# Patient Record
Sex: Female | Born: 1945 | Hispanic: Yes | Marital: Single | Smoking: Former smoker
Health system: Southern US, Community
[De-identification: ages and names within clinical notes are randomized; demographics above are authoritative.]

## PROBLEM LIST (undated history)

## (undated) DIAGNOSIS — K219 Gastro-esophageal reflux disease without esophagitis: Secondary | ICD-10-CM

## (undated) DIAGNOSIS — K5792 Diverticulitis of intestine, part unspecified, without perforation or abscess without bleeding: Secondary | ICD-10-CM

## (undated) DIAGNOSIS — M1990 Unspecified osteoarthritis, unspecified site: Secondary | ICD-10-CM

## (undated) DIAGNOSIS — J449 Chronic obstructive pulmonary disease, unspecified: Secondary | ICD-10-CM

## (undated) DIAGNOSIS — M797 Fibromyalgia: Secondary | ICD-10-CM

## (undated) DIAGNOSIS — I10 Essential (primary) hypertension: Secondary | ICD-10-CM

## (undated) DIAGNOSIS — F32A Depression, unspecified: Secondary | ICD-10-CM

## (undated) DIAGNOSIS — R51 Headache: Secondary | ICD-10-CM

## (undated) DIAGNOSIS — E785 Hyperlipidemia, unspecified: Secondary | ICD-10-CM

## (undated) DIAGNOSIS — F319 Bipolar disorder, unspecified: Secondary | ICD-10-CM

## (undated) DIAGNOSIS — R519 Headache, unspecified: Secondary | ICD-10-CM

## (undated) DIAGNOSIS — F329 Major depressive disorder, single episode, unspecified: Secondary | ICD-10-CM

## (undated) DIAGNOSIS — K8020 Calculus of gallbladder without cholecystitis without obstruction: Secondary | ICD-10-CM

## (undated) HISTORY — DX: Bipolar disorder, unspecified: F31.9

## (undated) HISTORY — PX: TONSILLECTOMY AND ADENOIDECTOMY: SUR1326

## (undated) HISTORY — DX: Essential (primary) hypertension: I10

## (undated) HISTORY — DX: Depression, unspecified: F32.A

## (undated) HISTORY — PX: TOTAL HIP ARTHROPLASTY: SHX124

## (undated) HISTORY — DX: Diverticulitis of intestine, part unspecified, without perforation or abscess without bleeding: K57.92

## (undated) HISTORY — DX: Fibromyalgia: M79.7

## (undated) HISTORY — DX: Chronic obstructive pulmonary disease, unspecified: J44.9

## (undated) HISTORY — DX: Major depressive disorder, single episode, unspecified: F32.9

## (undated) HISTORY — DX: Unspecified osteoarthritis, unspecified site: M19.90

## (undated) HISTORY — DX: Hyperlipidemia, unspecified: E78.5

## (undated) HISTORY — DX: Calculus of gallbladder without cholecystitis without obstruction: K80.20

## (undated) SURGERY — Surgical Case
Anesthesia: *Unknown

---

## 1999-01-29 HISTORY — PX: COLON RESECTION: SHX5231

## 2005-06-14 ENCOUNTER — Emergency Department (HOSPITAL_COMMUNITY): Admission: EM | Admit: 2005-06-14 | Discharge: 2005-06-14 | Payer: Self-pay | Admitting: Family Medicine

## 2005-07-01 ENCOUNTER — Ambulatory Visit: Payer: Self-pay | Admitting: Gastroenterology

## 2005-07-02 ENCOUNTER — Other Ambulatory Visit: Admission: RE | Admit: 2005-07-02 | Discharge: 2005-07-02 | Payer: Self-pay | Admitting: Family Medicine

## 2005-07-02 ENCOUNTER — Ambulatory Visit: Payer: Self-pay | Admitting: Family Medicine

## 2005-07-02 ENCOUNTER — Encounter: Payer: Self-pay | Admitting: Family Medicine

## 2005-07-04 ENCOUNTER — Ambulatory Visit (HOSPITAL_COMMUNITY): Admission: RE | Admit: 2005-07-04 | Discharge: 2005-07-04 | Payer: Self-pay | Admitting: Gastroenterology

## 2005-07-17 ENCOUNTER — Encounter: Admission: RE | Admit: 2005-07-17 | Discharge: 2005-07-17 | Payer: Self-pay | Admitting: Family Medicine

## 2005-08-13 ENCOUNTER — Ambulatory Visit: Payer: Self-pay | Admitting: Gastroenterology

## 2006-07-18 ENCOUNTER — Encounter: Payer: Self-pay | Admitting: Family Medicine

## 2006-11-26 ENCOUNTER — Other Ambulatory Visit (HOSPITAL_COMMUNITY): Admission: RE | Admit: 2006-11-26 | Discharge: 2007-02-07 | Payer: Self-pay | Admitting: Psychiatry

## 2006-11-27 ENCOUNTER — Ambulatory Visit: Payer: Self-pay | Admitting: Psychiatry

## 2006-12-08 DIAGNOSIS — Z9089 Acquired absence of other organs: Secondary | ICD-10-CM

## 2006-12-08 DIAGNOSIS — J4489 Other specified chronic obstructive pulmonary disease: Secondary | ICD-10-CM | POA: Insufficient documentation

## 2006-12-08 DIAGNOSIS — F319 Bipolar disorder, unspecified: Secondary | ICD-10-CM

## 2006-12-08 DIAGNOSIS — M1990 Unspecified osteoarthritis, unspecified site: Secondary | ICD-10-CM | POA: Insufficient documentation

## 2006-12-08 DIAGNOSIS — Z8719 Personal history of other diseases of the digestive system: Secondary | ICD-10-CM

## 2006-12-08 DIAGNOSIS — E785 Hyperlipidemia, unspecified: Secondary | ICD-10-CM | POA: Insufficient documentation

## 2006-12-08 DIAGNOSIS — J449 Chronic obstructive pulmonary disease, unspecified: Secondary | ICD-10-CM

## 2006-12-09 ENCOUNTER — Ambulatory Visit: Payer: Self-pay | Admitting: Family Medicine

## 2006-12-09 DIAGNOSIS — N951 Menopausal and female climacteric states: Secondary | ICD-10-CM

## 2006-12-13 ENCOUNTER — Ambulatory Visit: Payer: Self-pay | Admitting: Family Medicine

## 2006-12-13 DIAGNOSIS — M545 Low back pain: Secondary | ICD-10-CM

## 2006-12-13 DIAGNOSIS — S139XXA Sprain of joints and ligaments of unspecified parts of neck, initial encounter: Secondary | ICD-10-CM

## 2006-12-15 LAB — CONVERTED CEMR LAB
ALT: 37 units/L — ABNORMAL HIGH (ref 0–35)
Albumin: 4.2 g/dL (ref 3.5–5.2)
Alkaline Phosphatase: 60 units/L (ref 39–117)
BUN: 17 mg/dL (ref 6–23)
Basophils Relative: 0.7 % (ref 0.0–1.0)
Bilirubin, Direct: 0.1 mg/dL (ref 0.0–0.3)
CO2: 27 meq/L (ref 19–32)
Chloride: 103 meq/L (ref 96–112)
Eosinophils Relative: 2.5 % (ref 0.0–5.0)
GFR calc Af Amer: 94 mL/min
Glucose, Bld: 121 mg/dL — ABNORMAL HIGH (ref 70–99)
HCT: 35.9 % — ABNORMAL LOW (ref 36.0–46.0)
HDL: 67.3 mg/dL (ref >39.0)
Neutro Abs: 3.3 10*3/uL (ref 1.4–7.7)
Neutrophils Relative %: 68 % (ref 43.0–77.0)
Platelets: 309 10*3/uL (ref 150–400)
Potassium: 4 meq/L (ref 3.5–5.1)
Total Protein: 7.3 g/dL (ref 6.0–8.3)
VLDL: 23 mg/dL (ref 0–40)
WBC: 4.8 10*3/uL (ref 4.5–10.5)

## 2007-02-02 ENCOUNTER — Encounter (INDEPENDENT_AMBULATORY_CARE_PROVIDER_SITE_OTHER): Payer: Self-pay | Admitting: *Deleted

## 2007-03-11 ENCOUNTER — Telehealth (INDEPENDENT_AMBULATORY_CARE_PROVIDER_SITE_OTHER): Payer: Self-pay | Admitting: *Deleted

## 2007-04-14 ENCOUNTER — Ambulatory Visit: Payer: Self-pay | Admitting: Family Medicine

## 2007-04-26 LAB — CONVERTED CEMR LAB
CO2: 32 meq/L (ref 19–32)
Calcium: 9.7 mg/dL (ref 8.4–10.5)
Chloride: 102 meq/L (ref 96–112)
GFR calc non Af Amer: 91 mL/min

## 2007-04-27 ENCOUNTER — Encounter (INDEPENDENT_AMBULATORY_CARE_PROVIDER_SITE_OTHER): Payer: Self-pay | Admitting: *Deleted

## 2007-12-21 ENCOUNTER — Telehealth: Payer: Self-pay | Admitting: Family Medicine

## 2007-12-22 ENCOUNTER — Telehealth: Payer: Self-pay | Admitting: Family Medicine

## 2008-01-04 ENCOUNTER — Ambulatory Visit: Payer: Self-pay | Admitting: Family Medicine

## 2008-01-04 DIAGNOSIS — I10 Essential (primary) hypertension: Secondary | ICD-10-CM

## 2008-01-04 DIAGNOSIS — B354 Tinea corporis: Secondary | ICD-10-CM | POA: Insufficient documentation

## 2008-01-06 ENCOUNTER — Encounter (INDEPENDENT_AMBULATORY_CARE_PROVIDER_SITE_OTHER): Payer: Self-pay | Admitting: *Deleted

## 2008-01-07 ENCOUNTER — Encounter: Payer: Self-pay | Admitting: Family Medicine

## 2008-01-07 ENCOUNTER — Ambulatory Visit: Payer: Self-pay | Admitting: Family Medicine

## 2008-01-07 ENCOUNTER — Telehealth (INDEPENDENT_AMBULATORY_CARE_PROVIDER_SITE_OTHER): Payer: Self-pay | Admitting: *Deleted

## 2008-01-07 DIAGNOSIS — M79609 Pain in unspecified limb: Secondary | ICD-10-CM

## 2008-01-07 DIAGNOSIS — IMO0001 Reserved for inherently not codable concepts without codable children: Secondary | ICD-10-CM

## 2008-01-07 LAB — CONVERTED CEMR LAB
ALT: 17 units/L (ref 0–35)
AST: 29 units/L (ref 0–37)
Albumin: 4.3 g/dL (ref 3.5–5.2)
CO2: 27 meq/L (ref 19–32)
Chloride: 103 meq/L (ref 96–112)
Creatinine, Ser: 0.8 mg/dL (ref 0.4–1.2)
Direct LDL: 154.9 mg/dL
GFR calc non Af Amer: 78 mL/min
HDL: 66.3 mg/dL (ref >39.0)
Total Bilirubin: 1.1 mg/dL (ref 0.3–1.2)
Total Protein: 7.4 g/dL (ref 6.0–8.3)
Uric Acid, Serum: 5.1 mg/dL (ref 2.4–7.0)
VLDL: 23 mg/dL (ref 0–40)

## 2008-01-08 ENCOUNTER — Encounter (INDEPENDENT_AMBULATORY_CARE_PROVIDER_SITE_OTHER): Payer: Self-pay | Admitting: *Deleted

## 2008-01-08 ENCOUNTER — Telehealth (INDEPENDENT_AMBULATORY_CARE_PROVIDER_SITE_OTHER): Payer: Self-pay | Admitting: *Deleted

## 2008-01-18 ENCOUNTER — Ambulatory Visit: Payer: Self-pay | Admitting: Cardiology

## 2008-01-19 ENCOUNTER — Telehealth (INDEPENDENT_AMBULATORY_CARE_PROVIDER_SITE_OTHER): Payer: Self-pay | Admitting: *Deleted

## 2008-01-21 ENCOUNTER — Encounter (INDEPENDENT_AMBULATORY_CARE_PROVIDER_SITE_OTHER): Payer: Self-pay | Admitting: *Deleted

## 2008-01-25 ENCOUNTER — Ambulatory Visit: Payer: Self-pay | Admitting: Family Medicine

## 2008-01-26 ENCOUNTER — Encounter (INDEPENDENT_AMBULATORY_CARE_PROVIDER_SITE_OTHER): Payer: Self-pay | Admitting: *Deleted

## 2008-01-26 LAB — CONVERTED CEMR LAB
Calcium: 10.1 mg/dL (ref 8.4–10.5)
GFR calc Af Amer: 94 mL/min
GFR calc non Af Amer: 78 mL/min
Glucose, Bld: 115 mg/dL — ABNORMAL HIGH (ref 70–99)

## 2008-02-08 ENCOUNTER — Ambulatory Visit: Payer: Self-pay | Admitting: Family Medicine

## 2008-02-16 ENCOUNTER — Encounter: Payer: Self-pay | Admitting: Family Medicine

## 2008-02-16 ENCOUNTER — Telehealth: Payer: Self-pay | Admitting: Family Medicine

## 2008-02-29 ENCOUNTER — Telehealth (INDEPENDENT_AMBULATORY_CARE_PROVIDER_SITE_OTHER): Payer: Self-pay | Admitting: *Deleted

## 2008-03-24 ENCOUNTER — Ambulatory Visit: Payer: Self-pay | Admitting: Family Medicine

## 2008-03-24 ENCOUNTER — Telehealth (INDEPENDENT_AMBULATORY_CARE_PROVIDER_SITE_OTHER): Payer: Self-pay | Admitting: *Deleted

## 2008-03-24 DIAGNOSIS — K219 Gastro-esophageal reflux disease without esophagitis: Secondary | ICD-10-CM

## 2008-03-24 DIAGNOSIS — R1319 Other dysphagia: Secondary | ICD-10-CM

## 2008-03-25 ENCOUNTER — Ambulatory Visit: Payer: Self-pay | Admitting: Gastroenterology

## 2008-03-25 DIAGNOSIS — R12 Heartburn: Secondary | ICD-10-CM

## 2008-03-29 ENCOUNTER — Ambulatory Visit (HOSPITAL_COMMUNITY): Admission: RE | Admit: 2008-03-29 | Discharge: 2008-03-29 | Payer: Self-pay | Admitting: Gastroenterology

## 2008-03-31 ENCOUNTER — Telehealth (INDEPENDENT_AMBULATORY_CARE_PROVIDER_SITE_OTHER): Payer: Self-pay | Admitting: *Deleted

## 2008-04-14 ENCOUNTER — Ambulatory Visit: Payer: Self-pay | Admitting: Gastroenterology

## 2008-04-14 ENCOUNTER — Ambulatory Visit (HOSPITAL_COMMUNITY): Admission: RE | Admit: 2008-04-14 | Discharge: 2008-04-14 | Payer: Self-pay | Admitting: Gastroenterology

## 2008-04-20 ENCOUNTER — Telehealth: Payer: Self-pay | Admitting: Gastroenterology

## 2008-05-16 ENCOUNTER — Telehealth (INDEPENDENT_AMBULATORY_CARE_PROVIDER_SITE_OTHER): Payer: Self-pay | Admitting: *Deleted

## 2008-06-13 ENCOUNTER — Telehealth (INDEPENDENT_AMBULATORY_CARE_PROVIDER_SITE_OTHER): Payer: Self-pay | Admitting: *Deleted

## 2008-06-16 ENCOUNTER — Telehealth (INDEPENDENT_AMBULATORY_CARE_PROVIDER_SITE_OTHER): Payer: Self-pay | Admitting: *Deleted

## 2008-06-17 ENCOUNTER — Encounter (INDEPENDENT_AMBULATORY_CARE_PROVIDER_SITE_OTHER): Payer: Self-pay | Admitting: *Deleted

## 2008-06-20 ENCOUNTER — Telehealth (INDEPENDENT_AMBULATORY_CARE_PROVIDER_SITE_OTHER): Payer: Self-pay | Admitting: *Deleted

## 2008-08-10 ENCOUNTER — Ambulatory Visit: Payer: Self-pay | Admitting: Gastroenterology

## 2008-09-23 ENCOUNTER — Telehealth (INDEPENDENT_AMBULATORY_CARE_PROVIDER_SITE_OTHER): Payer: Self-pay | Admitting: *Deleted

## 2008-10-06 ENCOUNTER — Telehealth (INDEPENDENT_AMBULATORY_CARE_PROVIDER_SITE_OTHER): Payer: Self-pay | Admitting: *Deleted

## 2008-10-10 ENCOUNTER — Ambulatory Visit: Payer: Self-pay | Admitting: Family Medicine

## 2008-10-10 DIAGNOSIS — R531 Weakness: Secondary | ICD-10-CM | POA: Insufficient documentation

## 2008-10-21 ENCOUNTER — Ambulatory Visit: Payer: Self-pay | Admitting: Licensed Clinical Social Worker

## 2008-10-26 ENCOUNTER — Ambulatory Visit: Payer: Self-pay | Admitting: Licensed Clinical Social Worker

## 2008-11-02 ENCOUNTER — Ambulatory Visit: Payer: Self-pay | Admitting: Licensed Clinical Social Worker

## 2008-11-09 ENCOUNTER — Ambulatory Visit: Payer: Self-pay | Admitting: Licensed Clinical Social Worker

## 2008-11-30 ENCOUNTER — Ambulatory Visit: Payer: Self-pay | Admitting: Licensed Clinical Social Worker

## 2008-12-07 ENCOUNTER — Ambulatory Visit: Payer: Self-pay | Admitting: Licensed Clinical Social Worker

## 2008-12-13 ENCOUNTER — Encounter: Payer: Self-pay | Admitting: Family Medicine

## 2008-12-14 ENCOUNTER — Ambulatory Visit: Payer: Self-pay | Admitting: Licensed Clinical Social Worker

## 2009-01-04 ENCOUNTER — Ambulatory Visit: Payer: Self-pay | Admitting: Licensed Clinical Social Worker

## 2009-01-28 HISTORY — PX: COLOSTOMY CLOSURE: SHX1381

## 2009-02-01 ENCOUNTER — Ambulatory Visit: Payer: Self-pay | Admitting: Licensed Clinical Social Worker

## 2009-03-03 ENCOUNTER — Telehealth: Payer: Self-pay | Admitting: Gastroenterology

## 2009-03-10 ENCOUNTER — Ambulatory Visit: Payer: Self-pay | Admitting: Licensed Clinical Social Worker

## 2009-03-17 ENCOUNTER — Ambulatory Visit: Payer: Self-pay | Admitting: Licensed Clinical Social Worker

## 2009-03-30 ENCOUNTER — Ambulatory Visit: Payer: Self-pay | Admitting: Licensed Clinical Social Worker

## 2009-04-06 ENCOUNTER — Ambulatory Visit: Payer: Self-pay | Admitting: Licensed Clinical Social Worker

## 2009-04-25 ENCOUNTER — Ambulatory Visit: Payer: Self-pay | Admitting: Gastroenterology

## 2009-05-02 ENCOUNTER — Ambulatory Visit: Payer: Self-pay | Admitting: Licensed Clinical Social Worker

## 2009-05-18 ENCOUNTER — Ambulatory Visit: Payer: Self-pay | Admitting: Licensed Clinical Social Worker

## 2009-05-22 ENCOUNTER — Other Ambulatory Visit: Admission: RE | Admit: 2009-05-22 | Discharge: 2009-05-22 | Payer: Self-pay | Admitting: Family Medicine

## 2009-05-22 ENCOUNTER — Ambulatory Visit: Payer: Self-pay | Admitting: Family Medicine

## 2009-05-22 LAB — CONVERTED CEMR LAB
Protein, U semiquant: NEGATIVE
WBC Urine, dipstick: NEGATIVE
pH: 7

## 2009-05-23 LAB — CONVERTED CEMR LAB
Albumin: 4.4 g/dL (ref 3.5–5.2)
BUN: 14 mg/dL (ref 6–23)
Bilirubin, Direct: 0.1 mg/dL (ref 0.0–0.3)
CO2: 28 meq/L (ref 19–32)
Calcium: 9.4 mg/dL (ref 8.4–10.5)
Chloride: 100 meq/L (ref 96–112)
Eosinophils Relative: 4.9 % (ref 0.0–5.0)
GFR calc non Af Amer: 67.21 mL/min (ref >60)
Glucose, Bld: 103 mg/dL — ABNORMAL HIGH (ref 70–99)
HCT: 33.9 % — ABNORMAL LOW (ref 36.0–46.0)
Hemoglobin: 11.8 g/dL — ABNORMAL LOW (ref 12.0–15.0)
LDL Cholesterol: 101 mg/dL — ABNORMAL HIGH (ref 0–99)
Lymphocytes Relative: 24.8 % (ref 12.0–46.0)
MCV: 96.5 fL (ref 78.0–100.0)
Monocytes Relative: 9.3 % (ref 3.0–12.0)
RDW: 13.5 % (ref 11.5–14.6)
Sodium: 139 meq/L (ref 135–145)
TSH: 1.18 microintl units/mL (ref 0.35–5.50)
Total Bilirubin: 0.8 mg/dL (ref 0.3–1.2)
Total Protein: 7 g/dL (ref 6.0–8.3)
Vitamin B-12: 504 pg/mL (ref 211–911)

## 2009-05-24 ENCOUNTER — Encounter (INDEPENDENT_AMBULATORY_CARE_PROVIDER_SITE_OTHER): Payer: Self-pay | Admitting: *Deleted

## 2009-06-07 ENCOUNTER — Encounter: Admission: RE | Admit: 2009-06-07 | Discharge: 2009-06-07 | Payer: Self-pay | Admitting: Family Medicine

## 2009-06-07 ENCOUNTER — Encounter: Payer: Self-pay | Admitting: Family Medicine

## 2009-06-20 ENCOUNTER — Ambulatory Visit: Payer: Self-pay | Admitting: Licensed Clinical Social Worker

## 2009-07-04 ENCOUNTER — Ambulatory Visit: Payer: Self-pay | Admitting: Licensed Clinical Social Worker

## 2009-07-11 ENCOUNTER — Ambulatory Visit: Payer: Self-pay | Admitting: Licensed Clinical Social Worker

## 2009-07-19 ENCOUNTER — Ambulatory Visit: Payer: Self-pay | Admitting: Licensed Clinical Social Worker

## 2009-07-26 ENCOUNTER — Ambulatory Visit: Payer: Self-pay | Admitting: Licensed Clinical Social Worker

## 2009-08-03 ENCOUNTER — Ambulatory Visit: Payer: Self-pay | Admitting: Licensed Clinical Social Worker

## 2009-08-11 ENCOUNTER — Ambulatory Visit: Payer: Self-pay | Admitting: Licensed Clinical Social Worker

## 2009-08-16 ENCOUNTER — Ambulatory Visit: Payer: Self-pay | Admitting: Licensed Clinical Social Worker

## 2009-08-23 ENCOUNTER — Ambulatory Visit: Payer: Self-pay | Admitting: Licensed Clinical Social Worker

## 2009-08-31 ENCOUNTER — Ambulatory Visit: Payer: Self-pay | Admitting: Licensed Clinical Social Worker

## 2009-09-13 ENCOUNTER — Ambulatory Visit: Payer: Self-pay | Admitting: Family Medicine

## 2009-09-13 DIAGNOSIS — H109 Unspecified conjunctivitis: Secondary | ICD-10-CM | POA: Insufficient documentation

## 2009-09-14 ENCOUNTER — Ambulatory Visit: Payer: Self-pay | Admitting: Licensed Clinical Social Worker

## 2009-09-14 LAB — CONVERTED CEMR LAB: TSH: 2.17 microintl units/mL (ref 0.35–5.50)

## 2009-09-18 ENCOUNTER — Telehealth (INDEPENDENT_AMBULATORY_CARE_PROVIDER_SITE_OTHER): Payer: Self-pay | Admitting: *Deleted

## 2009-09-20 ENCOUNTER — Encounter: Payer: Self-pay | Admitting: Family Medicine

## 2009-09-21 ENCOUNTER — Ambulatory Visit: Payer: Self-pay | Admitting: Licensed Clinical Social Worker

## 2009-09-28 HISTORY — PX: FEMUR FRACTURE SURGERY: SHX633

## 2009-09-28 HISTORY — PX: JOINT REPLACEMENT: SHX530

## 2009-09-30 ENCOUNTER — Inpatient Hospital Stay (HOSPITAL_COMMUNITY): Admission: EM | Admit: 2009-09-30 | Discharge: 2009-10-05 | Payer: Self-pay | Admitting: Emergency Medicine

## 2009-09-30 DIAGNOSIS — F1021 Alcohol dependence, in remission: Secondary | ICD-10-CM

## 2009-10-31 ENCOUNTER — Telehealth (INDEPENDENT_AMBULATORY_CARE_PROVIDER_SITE_OTHER): Payer: Self-pay | Admitting: *Deleted

## 2009-12-08 ENCOUNTER — Ambulatory Visit: Payer: Self-pay | Admitting: Licensed Clinical Social Worker

## 2009-12-20 ENCOUNTER — Ambulatory Visit: Payer: Self-pay | Admitting: Licensed Clinical Social Worker

## 2010-01-01 ENCOUNTER — Ambulatory Visit: Payer: Self-pay | Admitting: Licensed Clinical Social Worker

## 2010-01-11 ENCOUNTER — Ambulatory Visit: Payer: Self-pay | Admitting: Licensed Clinical Social Worker

## 2010-01-17 ENCOUNTER — Telehealth: Payer: Self-pay | Admitting: Family Medicine

## 2010-02-08 ENCOUNTER — Encounter: Payer: Self-pay | Admitting: Family Medicine

## 2010-02-08 ENCOUNTER — Other Ambulatory Visit: Payer: Self-pay | Admitting: Family Medicine

## 2010-02-08 ENCOUNTER — Ambulatory Visit
Admission: RE | Admit: 2010-02-08 | Discharge: 2010-02-08 | Payer: Self-pay | Source: Home / Self Care | Attending: Family Medicine | Admitting: Family Medicine

## 2010-02-08 DIAGNOSIS — D649 Anemia, unspecified: Secondary | ICD-10-CM | POA: Insufficient documentation

## 2010-02-08 DIAGNOSIS — S72309A Unspecified fracture of shaft of unspecified femur, initial encounter for closed fracture: Secondary | ICD-10-CM | POA: Insufficient documentation

## 2010-02-08 DIAGNOSIS — E559 Vitamin D deficiency, unspecified: Secondary | ICD-10-CM | POA: Insufficient documentation

## 2010-02-09 ENCOUNTER — Ambulatory Visit: Admit: 2010-02-09 | Payer: Self-pay | Admitting: Family Medicine

## 2010-02-09 LAB — BASIC METABOLIC PANEL
BUN: 19 mg/dL (ref 6–23)
CO2: 26 mEq/L (ref 19–32)
Calcium: 10.2 mg/dL (ref 8.4–10.5)
Chloride: 102 mEq/L (ref 96–112)
Creatinine, Ser: 0.9 mg/dL (ref 0.4–1.2)
GFR: 66.21 mL/min (ref >60.00)
Glucose, Bld: 99 mg/dL (ref 70–99)
Potassium: 4.3 mEq/L (ref 3.5–5.1)
Sodium: 137 mEq/L (ref 135–145)

## 2010-02-09 LAB — HEPATIC FUNCTION PANEL
ALT: 44 U/L — ABNORMAL HIGH (ref 0–35)
AST: 43 U/L — ABNORMAL HIGH (ref 0–37)
Albumin: 4.5 g/dL (ref 3.5–5.2)
Alkaline Phosphatase: 47 U/L (ref 39–117)
Bilirubin, Direct: 0.1 mg/dL (ref 0.0–0.3)
Total Bilirubin: 0.7 mg/dL (ref 0.3–1.2)
Total Protein: 6.9 g/dL (ref 6.0–8.3)

## 2010-02-09 LAB — LIPID PANEL
Cholesterol: 165 mg/dL (ref 0–200)
HDL: 53.5 mg/dL (ref >39.00)
LDL Cholesterol: 84 mg/dL (ref 0–99)
Total CHOL/HDL Ratio: 3
Triglycerides: 138 mg/dL (ref 0.0–149.0)
VLDL: 27.6 mg/dL (ref 0.0–40.0)

## 2010-02-09 LAB — CBC WITH DIFFERENTIAL/PLATELET
Basophils Absolute: 0 10*3/uL (ref 0.0–0.1)
Basophils Relative: 0.8 % (ref 0.0–3.0)
Eosinophils Absolute: 0.2 10*3/uL (ref 0.0–0.7)
Eosinophils Relative: 5.6 % — ABNORMAL HIGH (ref 0.0–5.0)
HCT: 33.6 % — ABNORMAL LOW (ref 36.0–46.0)
Hemoglobin: 11.5 g/dL — ABNORMAL LOW (ref 12.0–15.0)
Lymphocytes Relative: 48.3 % — ABNORMAL HIGH (ref 12.0–46.0)
Lymphs Abs: 1.8 10*3/uL (ref 0.7–4.0)
MCHC: 34.3 g/dL (ref 30.0–36.0)
MCV: 92.1 fl (ref 78.0–100.0)
Monocytes Absolute: 0.3 10*3/uL (ref 0.1–1.0)
Monocytes Relative: 8.6 % (ref 3.0–12.0)
Neutro Abs: 1.3 10*3/uL — ABNORMAL LOW (ref 1.4–7.7)
Neutrophils Relative %: 36.7 % — ABNORMAL LOW (ref 43.0–77.0)
Platelets: 278 10*3/uL (ref 150.0–400.0)
RBC: 3.65 Mil/uL — ABNORMAL LOW (ref 3.87–5.11)
RDW: 13.6 % (ref 11.5–14.6)
WBC: 3.7 10*3/uL — ABNORMAL LOW (ref 4.5–10.5)

## 2010-02-18 ENCOUNTER — Encounter: Payer: Self-pay | Admitting: Family Medicine

## 2010-02-27 NOTE — Assessment & Plan Note (Signed)
Summary: eye red/cbs   Vital Signs:  Patient profile:   65 year old female Height:      66 inches Weight:      189 pounds Temp:     98.5 degrees F oral Pulse rate:   86 / minute BP sitting:   138 / 78  (left arm)  Vitals Entered By: Jeremy Johann CMA (September 13, 2009 11:35 AM) CC: EYE REDNESS   History of Present Illness: Pt here c/o d/c from both eyes and irritation for several days ---pt used otc drops with no relief.   No other symptoms. Pt also c/o difficulty losing weight and would like thyroid checked again.    Current Medications (verified): 1)  Simvastatin 20 Mg Tabs (Simvastatin) .Marland Kitchen.. 1 By Mouth At Bedtime 2)  Cymbalta 60 Mg Cpep (Duloxetine Hcl) .Marland Kitchen.. 1 By Mouth Two Times A Day 3)  Neurontin 600 Mg  Tabs (Gabapentin) .Marland Kitchen.. 1 By Mouth Three Times A Day 4)  Folic Acid .Marland Kitchen.. 1 By Mouth Once Daily 5)  Vit-B6 .... 1 By Mouth Once Daily 6)  Vit B12 .Marland Kitchen.. 1 By Mouth Once Daily 7)  Flax Seed Oil .Marland Kitchen.. 1 By Mouth Once Daily 8)  Mvi With Iron .Marland Kitchen.. 1 By Mouth Once Daily 9)  Coq10 100 Mg  Caps (Coenzyme Q10) .Marland Kitchen.. 1 By Mouth Every Other Day 10)  Magnesium .Marland Kitchen.. 1 By Mouth Once Daily 11)  Calcium 500mg  .... 1 By Mouth Three Times A Day 12)  Vit-C .Marland Kitchen.. 1 By Mouth Once Daily 13)  Ibuprofen 400 Mg  Tabs (Ibuprofen) .Marland Kitchen.. 1-2 By Mouth Three Times A Day As Needed 14)  Alprazolam 1 Mg  Tabs (Alprazolam) .... 1/2 By Mouth Three Times A Day Prn 15)  Seroquel 100 Mg Tabs (Quetiapine Fumarate) .Marland Kitchen.. 1 By Mouth Three Times A Day 16)  Adult Aspirin Ec Low Strength 81 Mg Tbec (Aspirin) .... Take 1 Tablet By Mouth Once A Day 17)  Vitamin D 2000 Unit Tabs (Cholecalciferol) .... Take 1 Tablet By Mouth Once A Day 18)  Acai 500 Mg Caps (Acai) .... 2 A Day. 19)  Potassium Gluconate 550 Mg Tabs (Potassium Gluconate) .... Once Daily. 20)  L-Lysine 500 Mg Tabs (Lysine) .... Once Daily. 21)  Omeprazole 20 Mg Cpdr (Omeprazole) .Marland Kitchen.. 1 By Mouth Two Times A Day 22)  Zostavax 16109 Unt/0.71ml Solr (Zoster  Vaccine Live) .Marland Kitchen.. 1 Ml Im X1 23)  Trilipix 135 Mg Cpdr (Choline Fenofibrate) .Marland Kitchen.. 1 By Mouth Daily. 24)  Slow Fe 160 (50 Fe) Mg Cr-Tabs (Ferrous Sulfate Dried) .Marland Kitchen.. 1 By Mouth Daily. 25)  Sulfacetamide Sodium 10 % Soln (Sulfacetamide Sodium) .... 2 Gtts Both Eyes Q3h For 7-10 Days  Allergies (verified): 1)  ! Levaquin  Past History:  Past medical, surgical, family and social histories (including risk factors) reviewed for relevance to current acute and chronic problems.  Past Medical History: Reviewed history from 01/04/2008 and no changes required. COPD Depression Diverticulitis, hx of Hyperlipidemia Osteoarthritis Hypertension  Past Surgical History: Reviewed history from 12/08/2006 and no changes required. Total hip replacement 1979 AND 1986  Family History: Reviewed history from 12/08/2006 and no changes required. MOTHER:DECEASED FATHER:DECEASED 2 BROTHERS 2 SISTERS HEART: MOTHER(STROKE) Family History of Arthritis: MOTHER'S FAMILY Family History Depression: MOTHER'S FAMILY Family History of Alcoholism/Addiction: BOTH SIDE'S OF THE FAMILY CANCER: MOTHER'S SIDE OF THE FAMILY  Social History: Reviewed history from 05/22/2009 and no changes required. Single Former Smoker Alcohol use-yes Drug use-yes Regular exercise-no Occupation: disabled / retired  Review of Systems      See HPI  Physical Exam  General:  Well-developed,well-nourished,in no acute distress; alert,appropriate and cooperative throughout examination Eyes:  both eyes L > R  + yellow d/c sclera injected - fb  Ears:  External ear exam shows no significant lesions or deformities.  Otoscopic examination reveals clear canals, tympanic membranes are intact bilaterally without bulging, retraction, inflammation or discharge. Hearing is grossly normal bilaterally. Skin:  Intact without suspicious lesions or rashes Psych:  Cognition and judgment appear intact. Alert and cooperative with normal attention  span and concentration. No apparent delusions, illusions, hallucinations   Impression & Recommendations:  Problem # 1:  UNSPECIFIED CONJUNCTIVITIS (ICD-372.30)  Her updated medication list for this problem includes:    Sulfacetamide Sodium 10 % Soln (Sulfacetamide sodium) .Marland Kitchen... 2 gtts both eyes q3h for 7-10 days  Discussed treatment, and urged patient to wash hands carefully after touching face.   Problem # 2:  DEPRESSION (ICD-311)  Her updated medication list for this problem includes:    Cymbalta 60 Mg Cpep (Duloxetine hcl) .Marland Kitchen... 1 by mouth two times a day    Alprazolam 1 Mg Tabs (Alprazolam) .Marland Kitchen... 1/2 by mouth three times a day prn  Orders: Venipuncture (16109) TLB-TSH (Thyroid Stimulating Hormone) (84443-TSH) TLB-T3, Free (Triiodothyronine) (84481-T3FREE) TLB-T4 (Thyrox), Free [phone]) T- * Misc. Laboratory test [phone])  Discussed treatment options, including trial of antidpressant medication. Will refer to behavioral health. Follow-up call in in 24-48 hours and recheck in 2 weeks, sooner as needed. Patient agrees to call if any worsening of symptoms or thoughts of doing harm arise. Verified that the patient has no suicidal ideation at this time.   Complete Medication List: 1)  Simvastatin 20 Mg Tabs (Simvastatin) .Marland Kitchen.. 1 by mouth at bedtime 2)  Cymbalta 60 Mg Cpep (Duloxetine hcl) .Marland Kitchen.. 1 by mouth two times a day 3)  Neurontin 600 Mg Tabs (Gabapentin) .Marland Kitchen.. 1 by mouth three times a day 4)  Folic Acid  .Marland Kitchen.. 1 by mouth once daily 5)  Vit-b6  .... 1 by mouth once daily 6)  Vit B12  .Marland KitchenMarland Kitchen. 1 by mouth once daily 7)  Flax Seed Oil  .Marland Kitchen.. 1 by mouth once daily 8)  Mvi With Iron  .Marland Kitchen.. 1 by mouth once daily 9)  Coq10 100 Mg Caps (Coenzyme q10) .Marland Kitchen.. 1 by mouth every other day 10)  Magnesium  .Marland KitchenMarland Kitchen. 1 by mouth once daily 11)  Calcium 500mg   .... 1 by mouth three times a day 12)  Vit-c  .Marland Kitchen.. 1 by mouth once daily 13)  Ibuprofen 400 Mg Tabs (Ibuprofen) .Marland Kitchen.. 1-2 by mouth three times a day  as needed 14)  Alprazolam 1 Mg Tabs (Alprazolam) .... 1/2 by mouth three times a day prn 15)  Seroquel 100 Mg Tabs (Quetiapine fumarate) .Marland Kitchen.. 1 by mouth three times a day 16)  Adult Aspirin Ec Low Strength 81 Mg Tbec (Aspirin) .... Take 1 tablet by mouth once a day 17)  Vitamin D 2000 Unit Tabs (Cholecalciferol) .... Take 1 tablet by mouth once a day 18)  Acai 500 Mg Caps (Acai) .... 2 a day. 19)  Potassium Gluconate 550 Mg Tabs (Potassium gluconate) .... Once daily. 20)  L-lysine 500 Mg Tabs (Lysine) .... Once daily. 21)  Omeprazole 20 Mg Cpdr (Omeprazole) .Marland Kitchen.. 1 by mouth two times a day 22)  Zostavax 82956 Unt/0.53ml Solr (Zoster vaccine live) .Marland Kitchen.. 1 ml im x1 23)  Trilipix 135 Mg Cpdr (Choline fenofibrate) .Marland Kitchen.. 1 by mouth  daily. 24)  Slow Fe 160 (50 Fe) Mg Cr-tabs (Ferrous sulfate dried) .Marland Kitchen.. 1 by mouth daily. 25)  Sulfacetamide Sodium 10 % Soln (Sulfacetamide sodium) .... 2 gtts both eyes q3h for 7-10 days Prescriptions: ZOSTAVAX 16109 UNT/0.65ML SOLR (ZOSTER VACCINE LIVE) 1 ml IM x1  #1 x 0   Entered and Authorized by:   Loreen Freud DO   Signed by:   Loreen Freud DO on 09/13/2009   Method used:   Print then Give to Patient   RxID:   [id number] SULFACETAMIDE SODIUM 10 % SOLN (SULFACETAMIDE SODIUM) 2 gtts both eyes q3h for 7-10 days  #10 days x 0   Entered and Authorized by:   Loreen Freud DO   Signed by:   Loreen Freud DO on 09/13/2009   Method used:   Electronically to        [facility]. [phone]* (retail)       [address]       [place], [place]  [address]       Ph: [phone]       Fax: [phone]   RxID:   [id number]

## 2010-02-27 NOTE — Progress Notes (Signed)
Summary: psychiatry referral   Phone Note Call from Patient Call back at [phone]   Caller: Patient Summary of Call: Patient called left msg on voicemail has been seen psychologist which has helped well but would now like a referral to see psychiatrist she thinks she should see one now.   ok w/ referral? Initial call taken by: Doristine Devoid CMA,  October 31, 2009 4:51 PM  Follow-up for Phone Call        give her a few numbers off list---Dr Evelene Croon,  Dr Nolen Mu and [facility] to [facility]---we can give her more if that doesn't work Follow-up by: Loreen Freud DO,  October 31, 2009 5:04 PM  Additional Follow-up for Phone Call Additional follow up Details #1::        Left message to call back Almeta Monas CMA [facility] D[facility])  November 01, 2009 2:55 PM     Additional Follow-up for Phone Call Additional follow up Details #2::    Message left on Triage Voicemail: Patient called 2 days ago and would like a call back at [phone]   I spoke with patient and gave her the numbers to all the recommended Dr.'s  Follow-up by: Shonna Chock CMA,  November 02, 2009 12:07 PM

## 2010-02-27 NOTE — Miscellaneous (Signed)
Summary: [facility]  [facility]   Imported By: Lanelle Bal 10/03/2009 10:02:37  _____________________________________________________________________  External Attachment:    Type:   Image     Comment:   External Document

## 2010-02-27 NOTE — Letter (Signed)
Summary: Results Follow up Letter  [facility] [place]  [address] [place], [place] [address]   Phone: [phone]  Fax: [phone]    05/24/2009 MRN: [id number]  Toni [name] [address] [place], [place]  [address]  Dear Ms. PARA,  The following are the results of your recent test(s):  Test         Result    Pap Smear:        Normal __X___  Not Normal _____ Comments: ______________________________________________________ Cholesterol: LDL(Bad cholesterol):         Your goal is less than:         HDL (Good cholesterol):       Your goal is more than: Comments:  ______________________________________________________ Mammogram:        Normal _____  Not Normal _____ Comments:  ___________________________________________________________________ Hemoccult:        Normal _____  Not normal _______ Comments:    _____________________________________________________________________ Other Tests:    We routinely do not discuss normal results over the telephone.  If you desire a copy of the results, or you have any questions about this information we can discuss them at your next office visit.   Sincerely,    Army Fossa CMA  May 24, 2009 7:54 AM

## 2010-02-27 NOTE — Progress Notes (Signed)
Summary: Schedule Office visit   Phone Note Outgoing Call Call back at [facility] Phone [phone]   Call placed by: Harlow Mares CMA Duncan Dull),  March 03, 2009 4:47 PM Call placed to: Patient Summary of Call: Left message on patients machine to call back.  Initial call taken by: Harlow Mares CMA [facility] Dull),  March 03, 2009 4:47 PM  Follow-up for Phone Call        Left message on patients machine to call back.  Follow-up by: Harlow Mares CMA [facility] Dull),  March 09, 2009 3:53 PM

## 2010-02-27 NOTE — Assessment & Plan Note (Signed)
Summary: CPX,pap/drb   Vital Signs:  Patient profile:   65 year old female Height:      66 inches Weight:      184 pounds BMI:     29.81 Pulse rate:   87 / minute Pulse rhythm:   regular BP sitting:   120 / 76  (left arm) Cuff size:   regular  Vitals Entered By: Army Fossa CMA (May 22, 2009 10:06 AM) CC: Pt here for CPX, and pap. Pt states she has been nausated and vomitting for about a month- she has an appt with Dr.Jacobs coming up.  Comments Would like to discuss a heart sonnogram.    History of Present Illness: Pt here for cpe and pap with labs.  Pt c/o NV.  She stopped prilosec and lisinopril because she thought it was causing her "muscles to collapse"--- she had been falling alot.   Pt states symptoms stopped when med stopped .  Pt has appointment with Dr Christella Hartigan within the next 3 weeks.    Pt is still drinking about 3/4 bottle of wine a night.  She has cut down from 1 1/2 bottles a night.  Pt knows she needs to stop drinking but says she can't cope without it.  She says she sees Judithe Modest for counseling and she she is aware.  Her drug of choice would be marijuana if she had access to it but she doesn't.  Pt states Ms Lottie Dawson is aware of all of this.  Pt has tried AA but says it is not for her.  Pt admits to having troble taking care of her self---she is very aware of that.    Preventive Screening-Counseling & Management  Alcohol-Tobacco     Alcohol drinks/day: 3     Alcohol type: WINE     >5/day in last 3 mos: yes     Alcohol Counseling: to decrease amount and/or frequency of alcohol intake     Feels need to cut down: yes     Smoking Status: quit     Year Quit: 2005  Caffeine-Diet-Exercise     Caffeine use/day: 0     Does Patient Exercise: no     Exercise Counseling: to improve exercise regimen  Hep-HIV-STD-Contraception     Dental Visit-last 6 months no     Dental Care Counseling: No coverage     SBE monthly: no     SBE Education/Counseling: to perform regular  SBE      Sexual History:  single.        Drug Use:  yes.    Current Medications (verified): 1)  Simvastatin 20 Mg Tabs (Simvastatin) .Marland Kitchen.. 1 By Mouth At Bedtime 2)  Cymbalta 60 Mg Cpep (Duloxetine Hcl) .Marland Kitchen.. 1 By Mouth Two Times A Day 3)  Neurontin 600 Mg  Tabs (Gabapentin) .Marland Kitchen.. 1 By Mouth Three Times A Day 4)  Folic Acid .Marland Kitchen.. 1 By Mouth Once Daily 5)  Vit-B6 .... 1 By Mouth Once Daily 6)  Vit B12 .Marland Kitchen.. 1 By Mouth Once Daily 7)  Flax Seed Oil .Marland Kitchen.. 1 By Mouth Once Daily 8)  Mvi With Iron .Marland Kitchen.. 1 By Mouth Once Daily 9)  Coq10 100 Mg  Caps (Coenzyme Q10) .Marland Kitchen.. 1 By Mouth Every Other Day 10)  Magnesium .Marland Kitchen.. 1 By Mouth Once Daily 11)  Calcium 500mg  .... 1 By Mouth Three Times A Day 12)  Vit-C .Marland Kitchen.. 1 By Mouth Once Daily 13)  Ibuprofen 400 Mg  Tabs (Ibuprofen) .Marland Kitchen.. 1-2 By Mouth  Three Times A Day As Needed 14)  Alprazolam 1 Mg  Tabs (Alprazolam) .... 1/2 By Mouth Three Times A Day Prn 15)  Seroquel 100 Mg Tabs (Quetiapine Fumarate) .Marland Kitchen.. 1 By Mouth Three Times A Day 16)  Adult Aspirin Ec Low Strength 81 Mg Tbec (Aspirin) .... Take 1 Tablet By Mouth Once A Day 17)  Vitamin D 2000 Unit Tabs (Cholecalciferol) .... Take 1 Tablet By Mouth Once A Day 18)  Acai 500 Mg Caps (Acai) .... 2 A Day. 19)  Potassium Gluconate 550 Mg Tabs (Potassium Gluconate) .... Once Daily. 20)  L-Lysine 500 Mg Tabs (Lysine) .... Once Daily. 21)  Omeprazole 20 Mg Cpdr (Omeprazole) .Marland Kitchen.. 1 By Mouth Two Times A Day 22)  Zostavax 16109 Unt/0.39ml Solr (Zoster Vaccine Live) .Marland Kitchen.. 1 Ml Im X1  Allergies: 1)  ! Levaquin  Past History:  Past Medical History: Last updated: 01/04/2008 COPD Depression Diverticulitis, hx of Hyperlipidemia Osteoarthritis Hypertension  Past Surgical History: Last updated: January 02, 2007 Total hip replacement 1979 AND 1986  Family History: Last updated: 01/02/2007 MOTHER:DECEASED FATHER:DECEASED 2 BROTHERS 2 SISTERS HEART: MOTHER(STROKE) Family History of Arthritis: MOTHER'S FAMILY Family  History Depression: MOTHER'S FAMILY Family History of Alcoholism/Addiction: BOTH SIDE'S OF THE FAMILY CANCER: MOTHER'S SIDE OF THE FAMILY  Social History: Last updated: 05/22/2009 Single Former Smoker Alcohol use-yes Drug use-yes Regular exercise-no Occupation: disabled / retired  Risk Factors: Alcohol Use: 3 (05/22/2009) >5 drinks/d w/in last 3 months: yes (05/22/2009) Caffeine Use: 0 (05/22/2009) Exercise: no (05/22/2009)  Risk Factors: Smoking Status: quit (05/22/2009)  Family History: Reviewed history from 01/02/07 and no changes required. MOTHER:DECEASED FATHER:DECEASED 2 BROTHERS 2 SISTERS HEART: MOTHER(STROKE) Family History of Arthritis: MOTHER'S FAMILY Family History Depression: MOTHER'S FAMILY Family History of Alcoholism/Addiction: BOTH SIDE'S OF THE FAMILY CANCER: MOTHER'S SIDE OF THE FAMILY  Social History: Reviewed history and no changes required. Single Former Smoker Alcohol use-yes Drug use-yes Regular exercise-no Occupation: disabled / retired Drug Use:  yes Sexual History:  single  Caffeine use/day:  0 Dental Care w/in 6 mos.:  no Occupation:  employed  Review of Systems      See HPI General:  Denies chills, fatigue, fever, loss of appetite, malaise, sleep disorder, sweats, weakness, and weight loss. Eyes:  Denies blurring, discharge, double vision, eye irritation, eye pain, halos, itching, light sensitivity, red eye, vision loss-1 eye, and vision loss-both eyes; optho--due. ENT:  Denies decreased hearing, difficulty swallowing, ear discharge, earache, hoarseness, nasal congestion, nosebleeds, postnasal drainage, ringing in ears, sinus pressure, and sore throat. CV:  Denies bluish discoloration of lips or nails, chest pain or discomfort, difficulty breathing at night, difficulty breathing while lying down, fainting, fatigue, leg cramps with exertion, lightheadness, near fainting, palpitations, shortness of breath with exertion, swelling of  feet, swelling of hands, and weight gain. Resp:  Denies chest discomfort, chest pain with inspiration, cough, coughing up blood, excessive snoring, hypersomnolence, morning headaches, pleuritic, shortness of breath, sputum productive, and wheezing. GI:  Complains of indigestion, nausea, and vomiting; denies abdominal pain, bloody stools, change in bowel habits, constipation, dark tarry stools, diarrhea, excessive appetite, gas, hemorrhoids, loss of appetite, vomiting blood, and yellowish skin color. GU:  Denies abnormal vaginal bleeding, decreased libido, discharge, dysuria, genital sores, hematuria, incontinence, nocturia, urinary frequency, and urinary hesitancy. MS:  Denies joint pain, joint redness, joint swelling, loss of strength, low back pain, mid back pain, muscle aches, muscle , cramps, muscle weakness, stiffness, and thoracic pain. Derm:  Denies changes in color of skin, changes in nail beds,  dryness, excessive perspiration, flushing, hair loss, insect bite(s), itching, lesion(s), poor wound healing, and rash. Neuro:  Complains of falling down; denies brief paralysis, difficulty with concentration, disturbances in coordination, headaches, inability to speak, memory loss, numbness, poor balance, seizures, sensation of room spinning, tingling, tremors, visual disturbances, and weakness; falling stopped with d/c lisinopril and omeprazole. Psych:  Complains of depression and easily tearful; denies alternate hallucination ( auditory/visual), anxiety, easily angered, irritability, mental problems, panic attacks, sense of great danger, suicidal thoughts/plans, thoughts of violence, unusual visions or sounds, and thoughts /plans of harming others. Endo:  Denies cold intolerance, excessive hunger, excessive thirst, excessive urination, heat intolerance, polyuria, and weight change. Heme:  Denies abnormal bruising, bleeding, enlarge lymph nodes, fevers, pallor, and skin discoloration. Allergy:  Denies  hives or rash, itching eyes, persistent infections, seasonal allergies, and sneezing.  Physical Exam  General:  Well-developed,well-nourished,in no acute distress; alert,appropriate and cooperative throughout examination Eyes:  pupils equal, pupils round, and pupils reactive to light.   Ears:  External ear exam shows no significant lesions or deformities.  Otoscopic examination reveals clear canals, tympanic membranes are intact bilaterally without bulging, retraction, inflammation or discharge. Hearing is grossly normal bilaterally. Nose:  External nasal examination shows no deformity or inflammation. Nasal mucosa are pink and moist without lesions or exudates. Mouth:  Oral mucosa and oropharynx without lesions or exudates.  Teeth in good repair. Neck:  No deformities, masses, or tenderness noted.no carotid bruits.   Chest Wall:  No deformities, masses, or tenderness noted. Breasts:  No mass, nodules, thickening, tenderness, bulging, retraction, inflamation, nipple discharge or skin changes noted.   Lungs:  Normal respiratory effort, chest expands symmetrically. Lungs are clear to auscultation, no crackles or wheezes. Heart:  normal rate and no murmur.   Abdomen:  Bowel sounds positive,abdomen soft and non-tender without masses, organomegaly or hernias noted. Rectal:  No external abnormalities noted. Normal sphincter tone. No rectal masses or tenderness. Genitalia:  Pelvic Exam:        External: normal female genitalia without lesions or masses        Vagina: normal without lesions or masses        Cervix: normal without lesions or masses        Adnexa: normal bimanual exam without masses or fullness        Uterus: normal by palpation        Pap smear: performed Msk:  normal ROM, no joint tenderness, no joint swelling, no joint warmth, no redness over joints, no joint deformities, no joint instability, and no crepitation.   Pulses:  R posterior tibial normal, R dorsalis pedis normal, R  carotid normal, L posterior tibial normal, L dorsalis pedis normal, and L carotid normal.   Extremities:  No clubbing, cyanosis, edema, or deformity noted with normal full range of motion of all joints.   Neurologic:  No cranial nerve deficits noted. Station and gait are normal. Plantar reflexes are down-going bilaterally. DTRs are symmetrical throughout. Sensory, motor and coordinative functions appear intact. Skin:  Intact without suspicious lesions or rashes Cervical Nodes:  No lymphadenopathy noted Axillary Nodes:  No palpable lymphadenopathy Psych:  Cognition and judgment appear intact. Alert and cooperative with normal attention span and concentration. No apparent delusions, illusions, hallucinations   Impression & Recommendations:  Problem # 1:  ROUTINE GYNECOLOGICAL EXAMINATION (ICD-V72.31) ghm utd pap done Orders: UA Dipstick w/o Micro (manual) (16109)  Problem # 2:  GERD (ICD-530.81)  The following medications were removed from the medication list:  Aciphex 20 Mg Tbec (Rabeprazole sodium) .Marland Kitchen... Take one pill 20-30 minutes prior to breakfast and dinner meals Her updated medication list for this problem includes:    Omeprazole 20 Mg Cpdr (Omeprazole) .Marland Kitchen... 1 by mouth two times a day  Diagnostics Reviewed:  EGD: Location: [facility]   (04/14/2008) Discussed lifestyle modifications, diet, antacids/medications, and preventive measures. Handout provided.   Problem # 3:  HYPERTENSION (ICD-401.9)  Orders: Venipuncture (19147) TLB-Lipid Panel (80061-LIPID) TLB-BMP (Basic Metabolic Panel-BMET) (80048-METABOL) TLB-CBC Platelet - w/Differential (85025-CBCD) TLB-Hepatic/Liver Function Pnl (80076-HEPATIC) TLB-TSH (Thyroid Stimulating Hormone) (84443-TSH) TLB-B12 + Folate Pnl (82956_21308-M57/QIO) T-Vitamin D (25-Hydroxy) (96295-28413) EKG w/ Interpretation (93000)  BP today: 120/76 Prior BP: 110/68 (10/10/2008)  Labs Reviewed: K+: 4.1 (01/25/2008) Creat: : 0.8  (01/25/2008)   Chol: 237 (01/04/2008)   HDL: 66.3 (01/04/2008)   LDL: DEL (01/04/2008)   TG: 116 (01/04/2008)  Problem # 4:  POSTMENOPAUSAL STATUS (ICD-627.2)  Orders: Venipuncture (24401) TLB-Lipid Panel (80061-LIPID) TLB-BMP (Basic Metabolic Panel-BMET) (80048-METABOL) TLB-CBC Platelet - w/Differential (85025-CBCD) TLB-Hepatic/Liver Function Pnl (80076-HEPATIC) TLB-TSH (Thyroid Stimulating Hormone) (84443-TSH) TLB-B12 + Folate Pnl (82746_82607-B12/FOL) T-Vitamin D (25-Hydroxy) (02725-36644) Radiology Referral (Radiology)  Discussed treatment options.   Problem # 5:  HYPERLIPIDEMIA (ICD-272.4)  Her updated medication list for this problem includes:    Simvastatin 20 Mg Tabs (Simvastatin) .Marland Kitchen... 1 by mouth at bedtime  Orders: Venipuncture (03474) TLB-Lipid Panel (80061-LIPID) TLB-BMP (Basic Metabolic Panel-BMET) (80048-METABOL) TLB-CBC Platelet - w/Differential (85025-CBCD) TLB-Hepatic/Liver Function Pnl (80076-HEPATIC) TLB-TSH (Thyroid Stimulating Hormone) (84443-TSH) TLB-B12 + Folate Pnl [phone] T-Vitamin D (25-Hydroxy) (18841-66063) EKG w/ Interpretation (93000)  Labs Reviewed: SGOT: 29 (01/04/2008)   SGPT: 17 (01/04/2008)   HDL:66.3 (01/04/2008), 67.3 (12/09/2006)  LDL:DEL (01/04/2008), 99 (01/60/1093)  Chol:237 (01/04/2008), 189 (12/09/2006)  Trig:116 (01/04/2008), 114 (12/09/2006)  Problem # 6:  COPD (ICD-496)  Problem # 7:  ALCOHOL ABUSE (ICD-305.00) encouraged pt to stop drinking and join AA pt is in counseling   Complete Medication List: 1)  Simvastatin 20 Mg Tabs (Simvastatin) .Marland Kitchen.. 1 by mouth at bedtime 2)  Cymbalta 60 Mg Cpep (Duloxetine hcl) .Marland Kitchen.. 1 by mouth two times a day 3)  Neurontin 600 Mg Tabs (Gabapentin) .Marland Kitchen.. 1 by mouth three times a day 4)  Folic Acid  .Marland Kitchen.. 1 by mouth once daily 5)  Vit-b6  .... 1 by mouth once daily 6)  Vit B12  .Marland KitchenMarland Kitchen. 1 by mouth once daily 7)  Flax Seed Oil  .Marland Kitchen.. 1 by mouth once daily 8)  Mvi With Iron  .Marland Kitchen.. 1 by  mouth once daily 9)  Coq10 100 Mg Caps (Coenzyme q10) .Marland Kitchen.. 1 by mouth every other day 10)  Magnesium  .Marland KitchenMarland Kitchen. 1 by mouth once daily 11)  Calcium 500mg   .... 1 by mouth three times a day 12)  Vit-c  .Marland Kitchen.. 1 by mouth once daily 13)  Ibuprofen 400 Mg Tabs (Ibuprofen) .Marland Kitchen.. 1-2 by mouth three times a day as needed 14)  Alprazolam 1 Mg Tabs (Alprazolam) .... 1/2 by mouth three times a day prn 15)  Seroquel 100 Mg Tabs (Quetiapine fumarate) .Marland Kitchen.. 1 by mouth three times a day 16)  Adult Aspirin Ec Low Strength 81 Mg Tbec (Aspirin) .... Take 1 tablet by mouth once a day 17)  Vitamin D 2000 Unit Tabs (Cholecalciferol) .... Take 1 tablet by mouth once a day 18)  Acai 500 Mg Caps (Acai) .... 2 a day. 19)  Potassium Gluconate 550 Mg Tabs (Potassium gluconate) .... Once daily. 20)  L-lysine 500 Mg Tabs (Lysine) .Marland KitchenMarland KitchenMarland Kitchen  Once daily. 21)  Omeprazole 20 Mg Cpdr (Omeprazole) .Marland Kitchen.. 1 by mouth two times a day 22)  Zostavax 16109 Unt/0.45ml Solr (Zoster vaccine live) .Marland Kitchen.. 1 ml im x1  Patient Instructions: 1)  It is not healthy for men to drink more then 2-3 drinks per day or for women to drink more then 1-2 drinks per day. --- secondary to your stomach issues it would be better for you not to drink alcohol at all 2)  Schedule your mammogram. --- we actually are scheduling it for you Prescriptions: ZOSTAVAX 60454 UNT/0.65ML SOLR (ZOSTER VACCINE LIVE) 1 ml IM x1  #1 x 0   Entered and Authorized by:   Loreen Freud DO   Signed by:   Loreen Freud DO on 05/22/2009   Method used:   Print then Give to Patient   RxID:   [id number] OMEPRAZOLE 20 MG CPDR (OMEPRAZOLE) 1 by mouth two times a day  #60 x 0   Entered and Authorized by:   Loreen Freud DO   Signed by:   Loreen Freud DO on 05/22/2009   Method used:   Electronically to        [facility]. [phone]* (retail)       [address]       [place], [place]  [address]       Ph: [phone]       Fax: [phone]   RxID:    [id number]    EKG  Procedure date:  05/22/2009  Findings:      Normal sinus rhythm with rate of:  85 bpm    Flu Vaccine Result Date:  11/09/2008 Flu Vaccine Result:  given Flu Vaccine Next Due:  1 yr  Laboratory Results   Urine Tests   Date/Time Reported: May 22, 2009 1:20 PM   Routine Urinalysis   Color: yellow Appearance: Clear Glucose: negative   (Normal Range: Negative) Bilirubin: negative   (Normal Range: Negative) Ketone: negative   (Normal Range: Negative) Spec. Gravity: 1.015   (Normal Range: 1.003-1.035) Blood: negative   (Normal Range: Negative) pH: 7.0   (Normal Range: 5.0-8.0) Protein: negative   (Normal Range: Negative) Urobilinogen: negative   (Normal Range: 0-1) Nitrite: negative   (Normal Range: Negative) Leukocyte Esterace: negative   (Normal Range: Negative)    Comments: Floydene Flock  May 22, 2009 1:21 PM

## 2010-02-27 NOTE — Consult Note (Signed)
Summary: [facility]   Imported By: Lanelle Bal 10/05/2009 12:23:56  _____________________________________________________________________  External Attachment:    Type:   Image     Comment:   External Document

## 2010-02-27 NOTE — Progress Notes (Signed)
Summary: EYE NO BETTER  Phone Note Call from Patient   Caller: Patient Summary of Call: PT LEFT VM THAT HER EYE DID GET BETTER BUT NOW IT HAS STARTED TO DECLINE AGAIN. PT IS REQUESTING TO BE REFERRED TO OPHTHALMOLOGIST .PLS ADVISE..............Marland KitchenFelecia Deloach CMA  September 18, 2009 11:46 AM   Follow-up for Phone Call        referral but in Follow-up by: Loreen Freud DO,  September 18, 2009 11:50 AM  Additional Follow-up for Phone Call Additional follow up Details #1::        left detailed msg on patient voicemail referral put in.........Marland KitchenDoristine Devoid CMA  September 18, 2009 4:30 PM

## 2010-03-01 NOTE — Miscellaneous (Signed)
Summary: Face to Face Encounter/[facility]  Face to Face Encounter/[facility]   Imported By: Lanelle Bal 02/15/2010 14:17:53  _____________________________________________________________________  External Attachment:    Type:   Image     Comment:   External Document

## 2010-03-01 NOTE — Assessment & Plan Note (Signed)
Summary: d/c from skilled nuring//lch   Vital Signs:  Patient profile:   65 year old female Weight:      183.8 pounds Pulse rate:   84 / minute Pulse rhythm:   regular BP sitting:   122 / 80  (left arm) Cuff size:   regular  Vitals Entered By: Almeta Monas CMA Duncan Dull) (February 08, 2010 2:36 PM) CC: wants to discuss being d/c from nursing facility   History of Present Illness: Pt here s/p R femur fracture 09/30/2009.  Pt was released from nursing facility Dec 30, 2009.   Pt needs homecare forms filled out.  Pt has been sober since Sept 3.     Preventive Screening-Counseling & Management  Alcohol-Tobacco     Alcohol drinks/day: 0  quit 09/30/2009  Current Medications (verified): 1)  Simvastatin 20 Mg Tabs (Simvastatin) .Marland Kitchen.. 1 By Mouth At Bedtime 2)  Cymbalta 60 Mg Cpep (Duloxetine Hcl) .Marland Kitchen.. 1 By Mouth Two Times A Day 3)  Neurontin 600 Mg  Tabs (Gabapentin) .Marland Kitchen.. 1 By Mouth Three Times A Day 4)  Folic Acid .Marland Kitchen.. 1 By Mouth Once Daily 5)  Vit-B6 .... 1 By Mouth Once Daily 6)  Vit B12 .Marland Kitchen.. 1 By Mouth Once Daily 7)  Flax Seed Oil .Marland Kitchen.. 1 By Mouth Once Daily 8)  Mvi With Iron .Marland Kitchen.. 1 By Mouth Once Daily 9)  Coq10 100 Mg  Caps (Coenzyme Q10) .Marland Kitchen.. 1 By Mouth Every Other Day 10)  Magnesium .Marland Kitchen.. 1 By Mouth Once Daily 11)  Calcium 500mg  .... 1 By Mouth Three Times A Day 12)  Vit-C .Marland Kitchen.. 1 By Mouth Once Daily 13)  Ibuprofen 400 Mg  Tabs (Ibuprofen) .Marland Kitchen.. 1-2 By Mouth Three Times A Day As Needed 14)  Alprazolam 1 Mg  Tabs (Alprazolam) .... 1/2 By Mouth Three Times A Day Prn 15)  Seroquel 100 Mg Tabs (Quetiapine Fumarate) .Marland Kitchen.. 1 By Mouth Three Times A Day 16)  Adult Aspirin Ec Low Strength 81 Mg Tbec (Aspirin) .... Take 1 Tablet By Mouth Once A Day 17)  Vitamin D 2000 Unit Tabs (Cholecalciferol) .... Take 1 Tablet By Mouth Once A Day 18)  Acai 500 Mg Caps (Acai) .... 2 A Day. 19)  Potassium Gluconate 550 Mg Tabs (Potassium Gluconate) .... Once Daily. 20)  L-Lysine 500 Mg Tabs (Lysine) .... Once  Daily. 21)  Omeprazole 20 Mg Cpdr (Omeprazole) .Marland Kitchen.. 1 By Mouth Two Times A Day 22)  Zostavax 16109 Unt/0.47ml Solr (Zoster Vaccine Live) .Marland Kitchen.. 1 Ml Im X1 23)  Trilipix 135 Mg Cpdr (Choline Fenofibrate) .Marland Kitchen.. 1 By Mouth Daily.**labs Due Now* 24)  Slow Fe 160 (50 Fe) Mg Cr-Tabs (Ferrous Sulfate Dried) .Marland Kitchen.. 1 By Mouth Daily. 25)  Sulfacetamide Sodium 10 % Soln (Sulfacetamide Sodium) .... 2 Gtts Both Eyes Q3h For 7-10 Days 26)  Robaxin 500 Mg Tabs (Methocarbamol) .Marland Kitchen.. 1 By Mouth Every 6 Hours As Needed Muscle Spasms 27)  Vicodin Unknown Dose 28)  Vitamin D 1000 Unit Tabs (Cholecalciferol) .... 4 A Day  Allergies (verified): 1)  ! Levaquin  Past History:  Past Medical History: Last updated: 01/04/2008 COPD Depression Diverticulitis, hx of Hyperlipidemia Osteoarthritis Hypertension  Family History: Last updated: 2006-12-23 MOTHER:DECEASED FATHER:DECEASED 2 BROTHERS 2 SISTERS HEART: MOTHER(STROKE) Family History of Arthritis: MOTHER'S FAMILY Family History Depression: MOTHER'S FAMILY Family History of Alcoholism/Addiction: BOTH SIDE'S OF THE FAMILY CANCER: MOTHER'S SIDE OF THE FAMILY  Social History: Last updated: 05/22/2009 Single Former Smoker Alcohol use-yes Drug use-yes Regular exercise-no Occupation: disabled /  retired  Risk Factors: Alcohol Use: 0  quit 09/30/2009 (02/08/2010) >5 drinks/d w/in last 3 months: yes (05/22/2009) Caffeine Use: 0 (05/22/2009) Exercise: no (05/22/2009)  Risk Factors: Smoking Status: quit (05/22/2009)  Past Surgical History: Total hip replacement 1979 AND 1986 R fractured femur 09/2009  Family History: Reviewed history from 12/08/2006 and no changes required. MOTHER:DECEASED FATHER:DECEASED 2 BROTHERS 2 SISTERS HEART: MOTHER(STROKE) Family History of Arthritis: MOTHER'S FAMILY Family History Depression: MOTHER'S FAMILY Family History of Alcoholism/Addiction: BOTH SIDE'S OF THE FAMILY CANCER: MOTHER'S SIDE OF THE FAMILY  Social  History: Reviewed history from 05/22/2009 and no changes required. Single Former Smoker Alcohol use-yes Drug use-yes Regular exercise-no Occupation: disabled / retired  Review of Systems      See HPI  Physical Exam  General:  Well-developed,well-nourished,in no acute distress; alert,appropriate and cooperative throughout examination Neck:  No deformities, masses, or tenderness noted. Lungs:  Normal respiratory effort, chest expands symmetrically. Lungs are clear to auscultation, no crackles or wheezes. Heart:  normal rate and no murmur.   Psych:  Oriented X3, normally interactive, good eye contact, not anxious appearing, not depressed appearing, not agitated, and not suicidal.     Impression & Recommendations:  Problem # 1:  CLOSED FRACTURE OF SHAFT OF FEMUR (ICD-821.01) Assessment Improved per ortho home health form filled out hospital records reviewed  Problem # 2:  ALCOHOL ABUSE, IN REMISSION, HX OF (ICD-V11.3) Assessment: Improved Great!  keep up the good work!  Problem # 3:  UNSPECIFIED VITAMIN D DEFICIENCY (ICD-268.9)  Orders: T-Vitamin D (25-Hydroxy) (30865-78469) Specimen Handling (62952)  Problem # 4:  UNSPECIFIED ANEMIA (ICD-285.9)  Her updated medication list for this problem includes:    Slow Fe 160 (50 Fe) Mg Cr-tabs (Ferrous sulfate dried) .Marland Kitchen... 1 by mouth daily.  Orders: Venipuncture (84132) TLB-Lipid Panel (80061-LIPID) TLB-BMP (Basic Metabolic Panel-BMET) (80048-METABOL) TLB-CBC Platelet - w/Differential (85025-CBCD) TLB-Hepatic/Liver Function Pnl (80076-HEPATIC)  Hgb: 11.8 (05/22/2009)   Hct: 33.9 (05/22/2009)   Platelets: 283.0 (05/22/2009) RBC: 3.52 (05/22/2009)   RDW: 13.5 (05/22/2009)   WBC: 4.4 (05/22/2009) MCV: 96.5 (05/22/2009)   MCHC: 34.8 (05/22/2009) B12: 504 (05/22/2009)   Folate: 7.2 (05/22/2009)   TSH: 2.17 (09/13/2009)  Problem # 5:  HYPERTENSION (ICD-401.9)  Orders: Venipuncture (44010) TLB-Lipid Panel (80061-LIPID) TLB-BMP  (Basic Metabolic Panel-BMET) (80048-METABOL) TLB-CBC Platelet - w/Differential (85025-CBCD) TLB-Hepatic/Liver Function Pnl (80076-HEPATIC) Specimen Handling (27253)  BP today: 122/80 Prior BP: 138/78 (09/13/2009)  Labs Reviewed: K+: 3.9 (05/22/2009) Creat: : 0.9 (05/22/2009)   Chol: 192 (05/22/2009)   HDL: 54.40 (05/22/2009)   LDL: 101 (05/22/2009)   TG: 182.0 (05/22/2009)  Problem # 6:  HYPERLIPIDEMIA (ICD-272.4)  Her updated medication list for this problem includes:    Simvastatin 20 Mg Tabs (Simvastatin) .Marland Kitchen... 1 by mouth at bedtime    Trilipix 135 Mg Cpdr (Choline fenofibrate) .Marland Kitchen... 1 by mouth daily.**labs due now*  Orders: Venipuncture (66440) TLB-Lipid Panel (80061-LIPID) TLB-BMP (Basic Metabolic Panel-BMET) (80048-METABOL) TLB-CBC Platelet - w/Differential (85025-CBCD) TLB-Hepatic/Liver Function Pnl (80076-HEPATIC) Specimen Handling (34742)  Labs Reviewed: SGOT: 35 (05/22/2009)   SGPT: 46 (05/22/2009)   HDL:54.40 (05/22/2009), 66.3 (01/04/2008)  LDL:101 (05/22/2009), DEL (01/04/2008)  Chol:192 (05/22/2009), 237 (01/04/2008)  Trig:182.0 (05/22/2009), 116 (01/04/2008)  Complete Medication List: 1)  Simvastatin 20 Mg Tabs (Simvastatin) .Marland Kitchen.. 1 by mouth at bedtime 2)  Cymbalta 60 Mg Cpep (Duloxetine hcl) .Marland Kitchen.. 1 by mouth two times a day 3)  Neurontin 600 Mg Tabs (Gabapentin) .Marland Kitchen.. 1 by mouth three times a day 4)  Folic Acid  .Marland Kitchen.. 1 by  mouth once daily 5)  Vit-b6  .... 1 by mouth once daily 6)  Vit B12  .Marland KitchenMarland Kitchen. 1 by mouth once daily 7)  Flax Seed Oil  .Marland Kitchen.. 1 by mouth once daily 8)  Mvi With Iron  .Marland Kitchen.. 1 by mouth once daily 9)  Coq10 100 Mg Caps (Coenzyme q10) .Marland Kitchen.. 1 by mouth every other day 10)  Magnesium  .Marland KitchenMarland Kitchen. 1 by mouth once daily 11)  Calcium 500mg   .... 1 by mouth three times a day 12)  Vit-c  .Marland Kitchen.. 1 by mouth once daily 13)  Ibuprofen 400 Mg Tabs (Ibuprofen) .Marland Kitchen.. 1-2 by mouth three times a day as needed 14)  Alprazolam 1 Mg Tabs (Alprazolam) .... 1/2 by mouth three times a  day prn 15)  Seroquel 100 Mg Tabs (Quetiapine fumarate) .Marland Kitchen.. 1 by mouth three times a day 16)  Adult Aspirin Ec Low Strength 81 Mg Tbec (Aspirin) .... Take 1 tablet by mouth once a day 17)  Vitamin D 2000 Unit Tabs (Cholecalciferol) .... Take 1 tablet by mouth once a day 18)  Acai 500 Mg Caps (Acai) .... 2 a day. 19)  Potassium Gluconate 550 Mg Tabs (Potassium gluconate) .... Once daily. 20)  L-lysine 500 Mg Tabs (Lysine) .... Once daily. 21)  Omeprazole 20 Mg Cpdr (Omeprazole) .Marland Kitchen.. 1 by mouth two times a day 22)  Zostavax 16109 Unt/0.28ml Solr (Zoster vaccine live) .Marland Kitchen.. 1 ml im x1 23)  Trilipix 135 Mg Cpdr (Choline fenofibrate) .Marland Kitchen.. 1 by mouth daily.**labs due now* 24)  Slow Fe 160 (50 Fe) Mg Cr-tabs (Ferrous sulfate dried) .Marland Kitchen.. 1 by mouth daily. 25)  Sulfacetamide Sodium 10 % Soln (Sulfacetamide sodium) .... 2 gtts both eyes q3h for 7-10 days 26)  Robaxin 500 Mg Tabs (Methocarbamol) .Marland Kitchen.. 1 by mouth every 6 hours as needed muscle spasms 27)  Vicodin Unknown Dose  28)  Vitamin D 1000 Unit Tabs (Cholecalciferol) .... 4 a day   Orders Added: 1)  Venipuncture [36415] 2)  TLB-Lipid Panel [80061-LIPID] 3)  TLB-BMP (Basic Metabolic Panel-BMET) [80048-METABOL] 4)  TLB-CBC Platelet - w/Differential [85025-CBCD] 5)  TLB-Hepatic/Liver Function Pnl [80076-HEPATIC] 6)  T-Vitamin D (25-Hydroxy) [60454-09811] 7)  Specimen Handling [99000] 8)  Est. Patient Level IV [91478]

## 2010-03-01 NOTE — Progress Notes (Signed)
Summary: Refill Request  Phone Note Refill Request Message from:  Patient on January 17, 2010 10:08 AM  Refills Requested: Medication #1:  NEURONTIN 600 MG  TABS 1 by mouth three times a day   Dosage confirmed as above?Dosage Confirmed   Supply Requested: 1 month   Last Refilled: 07/04/2009   Notes: patient got a rx for 60 day supply upon d/c from nursing home, but needed 90 day. So needs 30 day to keep her on track. [facility] [address]  Next Appointment Scheduled: 1.5.12 Initial call taken by: Harold Barban,  January 17, 2010 10:08 AM  Follow-up for Phone Call        seen 09/13/09 please advise if it is ok to call in a 30 day supply for patient to keep her on track. Follow-up by: Almeta Monas CMA Duncan Dull),  January 17, 2010 12:04 PM  Additional Follow-up for Phone Call Additional follow up Details #1::        that is fine Additional Follow-up by: Loreen Freud DO,  January 17, 2010 12:16 PM    Prescriptions: NEURONTIN 600 MG  TABS (GABAPENTIN) 1 by mouth three times a day  #30 x 0   Entered by:   Almeta Monas CMA (AAMA)   Authorized by:   Loreen Freud DO   Signed by:   Almeta Monas CMA (AAMA) on 01/17/2010   Method used:   Faxed to ...       [facility] [address] [phone]* (retail)       [address]       [place], [place]  [address]       Ph: [phone]       Fax: [phone]   RxID:   [id number]

## 2010-03-06 ENCOUNTER — Ambulatory Visit: Payer: Self-pay | Admitting: Licensed Clinical Social Worker

## 2010-03-26 ENCOUNTER — Telehealth: Payer: Self-pay | Admitting: Family Medicine

## 2010-04-05 NOTE — Progress Notes (Signed)
Summary: Seroquel Refill  Phone Note Refill Request Message from:  Pharmacy on March 26, 2010 11:06 AM  Refills Requested: Medication #1:  SEROQUEL 100 MG TABS 1 by mouth three times a day   Dosage confirmed as above?Dosage Confirmed   Last Refilled: 07/28/2009 [facility] [phone] fax # [phone]   Method Requested: Fax to Local Pharmacy Initial call taken by: Almeta Monas CMA Duncan Dull),  March 26, 2010 11:07 AM  Follow-up for Phone Call        please advise Follow-up by: Almeta Monas CMA Duncan Dull),  March 26, 2010 11:08 AM  Additional Follow-up for Phone Call Additional follow up Details #1::        refill x1  3 refills Additional Follow-up by: Loreen Freud DO,  March 26, 2010 11:31 AM    Prescriptions: SEROQUEL 100 MG TABS (QUETIAPINE FUMARATE) 1 by mouth three times a day  #90 Each x 3   Entered by:   Almeta Monas CMA (AAMA)   Authorized by:   Loreen Freud DO   Signed by:   Almeta Monas CMA (AAMA) on 03/26/2010   Method used:   Faxed to ...       [facility] [address] [phone]* (retail)       [address]       [place], [place]  [address]       Ph: [phone]       Fax: [phone]   RxID:   [id number]

## 2010-04-12 LAB — COMPREHENSIVE METABOLIC PANEL
ALT: 34 U/L (ref 0–35)
Alkaline Phosphatase: 72 U/L (ref 39–117)
BUN: 12 mg/dL (ref 6–23)
CO2: 29 mEq/L (ref 19–32)
Calcium: 8.7 mg/dL (ref 8.4–10.5)
Chloride: 103 mEq/L (ref 96–112)
Creatinine, Ser: 1.01 mg/dL (ref 0.4–1.2)
GFR calc non Af Amer: 59 mL/min — ABNORMAL LOW (ref >60)
Glucose, Bld: 102 mg/dL — ABNORMAL HIGH (ref 70–99)
Glucose, Bld: 141 mg/dL — ABNORMAL HIGH (ref 70–99)
Potassium: 3.7 mEq/L (ref 3.5–5.1)
Sodium: 138 mEq/L (ref 135–145)
Total Bilirubin: 0.7 mg/dL (ref 0.3–1.2)
Total Protein: 5.5 g/dL — ABNORMAL LOW (ref 6.0–8.3)

## 2010-04-12 LAB — BASIC METABOLIC PANEL
BUN: 15 mg/dL (ref 6–23)
BUN: 21 mg/dL (ref 6–23)
CO2: 26 mEq/L (ref 19–32)
CO2: 27 mEq/L (ref 19–32)
Calcium: 8.3 mg/dL — ABNORMAL LOW (ref 8.4–10.5)
Calcium: 9.4 mg/dL (ref 8.4–10.5)
Creatinine, Ser: 0.95 mg/dL (ref 0.4–1.2)
Creatinine, Ser: 0.96 mg/dL (ref 0.4–1.2)
Creatinine, Ser: 1.13 mg/dL (ref 0.4–1.2)
GFR calc Af Amer: >60 mL/min (ref >60)
GFR calc non Af Amer: 59 mL/min — ABNORMAL LOW (ref >60)
GFR calc non Af Amer: 59 mL/min — ABNORMAL LOW (ref >60)
Glucose, Bld: 121 mg/dL — ABNORMAL HIGH (ref 70–99)
Glucose, Bld: 141 mg/dL — ABNORMAL HIGH (ref 70–99)
Potassium: 4.5 mEq/L (ref 3.5–5.1)

## 2010-04-12 LAB — URINALYSIS, ROUTINE W REFLEX MICROSCOPIC
Glucose, UA: NEGATIVE mg/dL
Hgb urine dipstick: NEGATIVE
pH: 5.5 (ref 5.0–8.0)

## 2010-04-12 LAB — ABO/RH: ABO/RH(D): A POS

## 2010-04-12 LAB — CBC
HCT: 26.8 % — ABNORMAL LOW (ref 36.0–46.0)
HCT: 28.4 % — ABNORMAL LOW (ref 36.0–46.0)
Hemoglobin: 9.1 g/dL — ABNORMAL LOW (ref 12.0–15.0)
MCH: 32.8 pg (ref 26.0–34.0)
MCH: 32.9 pg (ref 26.0–34.0)
MCH: 33 pg (ref 26.0–34.0)
MCHC: 33.9 g/dL (ref 30.0–36.0)
MCHC: 34.1 g/dL (ref 30.0–36.0)
MCHC: 34.8 g/dL (ref 30.0–36.0)
MCV: 92.9 fL (ref 78.0–100.0)
MCV: 95.8 fL (ref 78.0–100.0)
Platelets: 138 10*3/uL — ABNORMAL LOW (ref 150–400)
Platelets: 211 10*3/uL (ref 150–400)
RBC: 2.49 MIL/uL — ABNORMAL LOW (ref 3.87–5.11)
RBC: 2.88 MIL/uL — ABNORMAL LOW (ref 3.87–5.11)
RBC: 3.35 MIL/uL — ABNORMAL LOW (ref 3.87–5.11)
RDW: 13.7 % (ref 11.5–15.5)
RDW: 15.9 % — ABNORMAL HIGH (ref 11.5–15.5)
RDW: 16.2 % — ABNORMAL HIGH (ref 11.5–15.5)
WBC: 6.1 10*3/uL (ref 4.0–10.5)

## 2010-04-12 LAB — URINE CULTURE: Culture: NO GROWTH

## 2010-04-12 LAB — DIFFERENTIAL
Basophils Absolute: 0 10*3/uL (ref 0.0–0.1)
Basophils Relative: 0 % (ref 0–1)
Eosinophils Absolute: 0.2 10*3/uL (ref 0.0–0.7)
Eosinophils Relative: 3 % (ref 0–5)
Lymphs Abs: 0.7 10*3/uL (ref 0.7–4.0)
Neutrophils Relative %: 80 % — ABNORMAL HIGH (ref 43–77)

## 2010-04-12 LAB — TYPE AND SCREEN: ABO/RH(D): A POS

## 2010-04-12 LAB — PREPARE RBC (CROSSMATCH)

## 2010-04-12 LAB — ETHANOL: Alcohol, Ethyl (B): 5 mg/dL (ref 0–10)

## 2010-04-12 LAB — PROTIME-INR
INR: 1.05 (ref 0.00–1.49)
Prothrombin Time: 13.9 seconds (ref 11.6–15.2)

## 2010-04-12 LAB — HEPATIC FUNCTION PANEL
AST: 48 U/L — ABNORMAL HIGH (ref 0–37)
Bilirubin, Direct: <0.1 mg/dL (ref 0.0–0.3)
Total Protein: 6.7 g/dL (ref 6.0–8.3)

## 2010-04-12 LAB — HEMOGLOBIN AND HEMATOCRIT, BLOOD: Hemoglobin: 8.8 g/dL — ABNORMAL LOW (ref 12.0–15.0)

## 2010-05-10 LAB — HEMOGLOBIN AND HEMATOCRIT, BLOOD
HCT: 33.8 % — ABNORMAL LOW (ref 36.0–46.0)
Hemoglobin: 11.4 g/dL — ABNORMAL LOW (ref 12.0–15.0)

## 2010-06-15 NOTE — Assessment & Plan Note (Signed)
[facility]                           GASTROENTEROLOGY OFFICE NOTE   NAME:TOUCHETArlayne, Toni [name]                        MRN:          [id number]  DATE:08/13/2005                            DOB:          [birth year]    PRIMARY CARE PHYSICIAN:  No primary care physician.   GI PROBLEM LIST:  1.  Complicated diverticulitis, treated with sigmoid colectomy in 2001,      sigmoid colostomy takedown November 2001. Post-surgery colonoscopy in      2002 was normal per patient.  2.  Functional abdominal discomfort, dyspepsia, bloating.  No weight loss,      no anemia.  No signs of GI bleeding.   INTERVAL HISTORY:  I last saw Toni Toni [name] six weeks ago.  Since then, her  abdominal pain has improved with MiraLax and relief of her relative  constipation.  She had small bowel follow-through study that was essentially  normal.  CBC was also normal.  She is still bothered by daily bloating.  She  admitted today to me that she has been very depressed lately about her  mother's death in 10/28/2004.  She has been drinking 5-6 full bottles of  wine by herself on a weekly basis.  She is seen in counseling.  She has had  no suicidal ideations.   CURRENT MEDICATIONS:  Ibuprofen, simvastatin, Effexor, gabapentin,  fluticasone, Atrovent, multivitamin, folic acid, vitamin D, flaxseed.  magnesium, coenzyme-Q, vitamin D, B-12, B-6, Prozac daily.   PHYSICAL EXAMINATION:  VITAL SIGNS:  Weight 140 pounds, up 5 pounds since  her last visit, blood pressure 126/82, pulse 88.  CONSTITUTIONAL:  Generally well appearing.  Labile affect, with tears at  times during this examination.  LUNGS:  Clear to auscultation bilaterally.  HEART:  Regular rate and rhythm.  ABDOMEN:  Soft, nontender, nondistended.  Normal bowel sounds.   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   She has no warning symptoms such as  weight loss, bleeding, or anemia.  She  is obviously quite depressed lately, drinking to excess, and I suspect these  are at least contributing to her abdominal discomforts.  She is seeing a  therapist on a weekly basis and has an appointment just following this  appointment.  I recommend that she continue working on those issues.  I see  no reason to do any further GI workup at this point, but she will return to  see me in 2 months' time, and sooner if needed.                                   Toni Fee, MD   DPJ/MedQ  DD:  08/13/2005  DT:  08/13/2005  Job #:  [id number]

## 2010-07-20 ENCOUNTER — Other Ambulatory Visit: Payer: Self-pay | Admitting: Family Medicine

## 2010-08-25 ENCOUNTER — Other Ambulatory Visit: Payer: Self-pay | Admitting: Family Medicine

## 2010-09-23 ENCOUNTER — Other Ambulatory Visit: Payer: Self-pay | Admitting: Family Medicine

## 2010-09-24 NOTE — Telephone Encounter (Signed)
Cymbalta Last filled 08-25-10 #60, last ov 02-08-10

## 2010-09-24 NOTE — Telephone Encounter (Signed)
Rx sent 

## 2010-10-23 ENCOUNTER — Other Ambulatory Visit: Payer: Self-pay | Admitting: Family Medicine

## 2010-11-02 LAB — URINE DRUGS OF ABUSE SCREEN W ALC, ROUTINE (REF LAB)
Benzodiazepines.: NEGATIVE
Ethyl Alcohol: <5
Opiate Screen, Urine: NEGATIVE
Phencyclidine (PCP): NEGATIVE
Propoxyphene: NEGATIVE

## 2010-11-05 LAB — URINE DRUGS OF ABUSE SCREEN W ALC, ROUTINE (REF LAB)
Amphetamine Screen, Ur: NEGATIVE
Amphetamine Screen, Ur: NEGATIVE
Benzodiazepines.: NEGATIVE
Ethyl Alcohol: <5
Ethyl Alcohol: <5
Marijuana Metabolite: NEGATIVE
Marijuana Metabolite: POSITIVE — AB
Opiate Screen, Urine: NEGATIVE
Opiate Screen, Urine: NEGATIVE

## 2010-11-05 LAB — THC (MARIJUANA), URINE, CONFIRMATION: Marijuana, Ur-Confirmation: 33 ng/mL

## 2010-11-06 LAB — URINE DRUGS OF ABUSE SCREEN W ALC, ROUTINE (REF LAB)
Amphetamine Screen, Ur: NEGATIVE
Barbiturate Quant, Ur: NEGATIVE
Benzodiazepines.: NEGATIVE
Benzodiazepines.: NEGATIVE
Cocaine Metabolites: NEGATIVE
Cocaine Metabolites: NEGATIVE
Creatinine,U: 169.7
Creatinine,U: 104.5
Methadone: NEGATIVE
Methadone: NEGATIVE
Opiate Screen, Urine: NEGATIVE
Phencyclidine (PCP): NEGATIVE
Phencyclidine (PCP): NEGATIVE
Phencyclidine (PCP): NEGATIVE
Propoxyphene: NEGATIVE
Propoxyphene: NEGATIVE

## 2010-11-06 LAB — THC (MARIJUANA), URINE, CONFIRMATION: Marijuana, Ur-Confirmation: 61 ng/mL

## 2010-11-24 ENCOUNTER — Other Ambulatory Visit: Payer: Self-pay | Admitting: Family Medicine

## 2010-12-22 ENCOUNTER — Other Ambulatory Visit: Payer: Self-pay | Admitting: Family Medicine

## 2010-12-24 NOTE — Telephone Encounter (Signed)
Last seen in this office 02/08/2010   please advise    KP

## 2011-01-24 ENCOUNTER — Other Ambulatory Visit: Payer: Self-pay | Admitting: Family Medicine

## 2011-01-24 NOTE — Telephone Encounter (Signed)
Last seen in this office 02/08/2010, last filled 12-22-10 #60

## 2011-01-24 NOTE — Telephone Encounter (Signed)
Rx Done. Attempted to Inform patient; mailbox full.

## 2011-01-24 NOTE — Telephone Encounter (Signed)
OK X1 

## 2011-03-01 ENCOUNTER — Other Ambulatory Visit: Payer: Self-pay | Admitting: Family Medicine

## 2011-04-17 ENCOUNTER — Ambulatory Visit (INDEPENDENT_AMBULATORY_CARE_PROVIDER_SITE_OTHER): Payer: Medicare Other | Admitting: Family Medicine

## 2011-04-17 ENCOUNTER — Other Ambulatory Visit (HOSPITAL_COMMUNITY)
Admission: RE | Admit: 2011-04-17 | Discharge: 2011-04-17 | Disposition: A | Discharge status: Home / Self Care | Payer: Medicare Other | Source: Ambulatory Visit | Attending: Family Medicine | Admitting: Family Medicine

## 2011-04-17 ENCOUNTER — Encounter: Payer: Self-pay | Admitting: Family Medicine

## 2011-04-17 VITALS — BP 136/82 | HR 80 | Temp 98.0°F | Ht 65.0 in | Wt 158.4 lb

## 2011-04-17 DIAGNOSIS — I10 Essential (primary) hypertension: Secondary | ICD-10-CM

## 2011-04-17 DIAGNOSIS — F1210 Cannabis abuse, uncomplicated: Secondary | ICD-10-CM

## 2011-04-17 DIAGNOSIS — Z124 Encounter for screening for malignant neoplasm of cervix: Secondary | ICD-10-CM

## 2011-04-17 DIAGNOSIS — F319 Bipolar disorder, unspecified: Secondary | ICD-10-CM

## 2011-04-17 DIAGNOSIS — M797 Fibromyalgia: Secondary | ICD-10-CM

## 2011-04-17 DIAGNOSIS — E785 Hyperlipidemia, unspecified: Secondary | ICD-10-CM | POA: Diagnosis not present

## 2011-04-17 DIAGNOSIS — Z0000 Encounter for general adult medical examination without abnormal findings: Secondary | ICD-10-CM | POA: Diagnosis not present

## 2011-04-17 DIAGNOSIS — F1021 Alcohol dependence, in remission: Secondary | ICD-10-CM

## 2011-04-17 DIAGNOSIS — F3130 Bipolar disorder, current episode depressed, mild or moderate severity, unspecified: Secondary | ICD-10-CM

## 2011-04-17 DIAGNOSIS — G4700 Insomnia, unspecified: Secondary | ICD-10-CM

## 2011-04-17 DIAGNOSIS — Z01419 Encounter for gynecological examination (general) (routine) without abnormal findings: Secondary | ICD-10-CM | POA: Diagnosis not present

## 2011-04-17 LAB — CBC WITH DIFFERENTIAL/PLATELET
Basophils Absolute: 0 10*3/uL (ref 0.0–0.1)
Eosinophils Absolute: 0.1 10*3/uL (ref 0.0–0.7)
Lymphocytes Relative: 32.4 % (ref 12.0–46.0)
MCHC: 33.9 g/dL (ref 30.0–36.0)
MCV: 95.2 fl (ref 78.0–100.0)
Monocytes Absolute: 0.3 10*3/uL (ref 0.1–1.0)
Neutrophils Relative %: 58.2 % (ref 43.0–77.0)
RDW: 14.2 % (ref 11.5–14.6)

## 2011-04-17 LAB — BASIC METABOLIC PANEL
BUN: 23 mg/dL (ref 6–23)
CO2: 28 mEq/L (ref 19–32)
Calcium: 9.7 mg/dL (ref 8.4–10.5)
Chloride: 101 mEq/L (ref 96–112)
Creatinine, Ser: 0.7 mg/dL (ref 0.4–1.2)
Glucose, Bld: 92 mg/dL (ref 70–99)

## 2011-04-17 LAB — HEPATIC FUNCTION PANEL
Albumin: 4.6 g/dL (ref 3.5–5.2)
Alkaline Phosphatase: 61 U/L (ref 39–117)
Bilirubin, Direct: 0 mg/dL (ref 0.0–0.3)

## 2011-04-17 LAB — LIPID PANEL
HDL: 67.2 mg/dL (ref >39.00)
Total CHOL/HDL Ratio: 3
Triglycerides: 91 mg/dL (ref 0.0–149.0)
VLDL: 18.2 mg/dL (ref 0.0–40.0)

## 2011-04-17 MED ORDER — LAMOTRIGINE 100 MG PO TABS: 100.0000 mg | ORAL_TABLET | Freq: Two times a day (BID) | ORAL | Status: DC

## 2011-04-17 MED ORDER — GABAPENTIN 600 MG PO TABS: ORAL_TABLET | ORAL | Status: DC

## 2011-04-17 MED ORDER — AMITRIPTYLINE HCL 10 MG PO TABS: 10.0000 mg | ORAL_TABLET | Freq: Every day | ORAL | Status: DC

## 2011-04-17 MED ORDER — DULOXETINE HCL 60 MG PO CPEP: ORAL_CAPSULE | ORAL | Status: DC

## 2011-04-17 MED ORDER — ALPRAZOLAM 0.25 MG PO TABS: 0.2500 mg | ORAL_TABLET | Freq: Every evening | ORAL | Status: DC | PRN

## 2011-04-17 NOTE — Patient Instructions (Addendum)
Preventive Care for Adults, Female A healthy lifestyle and preventive care can promote health and wellness. Preventive health guidelines for women include the following key practices.  A routine yearly physical is a good way to check with your caregiver about your health and preventive screening. It is a chance to share any concerns and updates on your health, and to receive a thorough exam.   Visit your dentist for a routine exam and preventive care every 6 months. Brush your teeth twice a day and floss once a day. Good oral hygiene prevents tooth decay and gum disease.   The frequency of eye exams is based on your age, health, family medical history, use of contact lenses, and other factors. Follow your caregiver's recommendations for frequency of eye exams.   Eat a healthy diet. Foods like vegetables, fruits, whole grains, low-fat dairy products, and lean protein foods contain the nutrients you need without too many calories. Decrease your intake of foods high in solid fats, added sugars, and salt. Eat the right amount of calories for you.Get information about a proper diet from your caregiver, if necessary.   Regular physical exercise is one of the most important things you can do for your health. Most adults should get at least 150 minutes of moderate-intensity exercise (any activity that increases your heart rate and causes you to sweat) each week. In addition, most adults need muscle-strengthening exercises on 2 or more days a week.   Maintain a healthy weight. The body mass index (BMI) is a screening tool to identify possible weight problems. It provides an estimate of body fat based on height and weight. Your caregiver can help determine your BMI, and can help you achieve or maintain a healthy weight.For adults 20 years and older:   A BMI below 18.5 is considered underweight.   A BMI of 18.5 to 24.9 is normal.   A BMI of 25 to 29.9 is considered overweight.   A BMI of 30 and above is  considered obese.   Maintain normal blood lipids and cholesterol levels by exercising and minimizing your intake of saturated fat. Eat a balanced diet with plenty of fruit and vegetables. Blood tests for lipids and cholesterol should begin at age 20 and be repeated every 5 years. If your lipid or cholesterol levels are high, you are over 50, or you are at high risk for heart disease, you may need your cholesterol levels checked more frequently.Ongoing high lipid and cholesterol levels should be treated with medicines if diet and exercise are not effective.   If you smoke, find out from your caregiver how to quit. If you do not use tobacco, do not start.   If you are pregnant, do not drink alcohol. If you are breastfeeding, be very cautious about drinking alcohol. If you are not pregnant and choose to drink alcohol, do not exceed 1 drink per day. One drink is considered to be 12 ounces (355 mL) of beer, 5 ounces (148 mL) of wine, or 1.5 ounces (44 mL) of liquor.   Avoid use of street drugs. Do not share needles with anyone. Ask for help if you need support or instructions about stopping the use of drugs.   High blood pressure causes heart disease and increases the risk of stroke. Your blood pressure should be checked at least every 1 to 2 years. Ongoing high blood pressure should be treated with medicines if weight loss and exercise are not effective.   If you are 55 to 65   years old, ask your caregiver if you should take aspirin to prevent strokes.   Diabetes screening involves taking a blood sample to check your fasting blood sugar level. This should be done once every 3 years, after age 45, if you are within normal weight and without risk factors for diabetes. Testing should be considered at a younger age or be carried out more frequently if you are overweight and have at least 1 risk factor for diabetes.   Breast cancer screening is essential preventive care for women. You should practice "breast  self-awareness." This means understanding the normal appearance and feel of your breasts and may include breast self-examination. Any changes detected, no matter how small, should be reported to a caregiver. Women in their 20s and 30s should have a clinical breast exam (CBE) by a caregiver as part of a regular health exam every 1 to 3 years. After age 40, women should have a CBE every year. Starting at age 40, women should consider having a mammography (breast X-ray test) every year. Women who have a family history of breast cancer should talk to their caregiver about genetic screening. Women at a high risk of breast cancer should talk to their caregivers about having magnetic resonance imaging (MRI) and a mammography every year.   The Pap test is a screening test for cervical cancer. A Pap test can show cell changes on the cervix that might become cervical cancer if left untreated. A Pap test is a procedure in which cells are obtained and examined from the lower end of the uterus (cervix).   Women should have a Pap test starting at age 21.   Between ages 21 and 29, Pap tests should be repeated every 2 years.   Beginning at age 30, you should have a Pap test every 3 years as long as the past 3 Pap tests have been normal.   Some women have medical problems that increase the chance of getting cervical cancer. Talk to your caregiver about these problems. It is especially important to talk to your caregiver if a new problem develops soon after your last Pap test. In these cases, your caregiver may recommend more frequent screening and Pap tests.   The above recommendations are the same for women who have or have not gotten the vaccine for human papillomavirus (HPV).   If you had a hysterectomy for a problem that was not cancer or a condition that could lead to cancer, then you no longer need Pap tests. Even if you no longer need a Pap test, a regular exam is a good idea to make sure no other problems are  starting.   If you are between ages 65 and 70, and you have had normal Pap tests going back 10 years, you no longer need Pap tests. Even if you no longer need a Pap test, a regular exam is a good idea to make sure no other problems are starting.   If you have had past treatment for cervical cancer or a condition that could lead to cancer, you need Pap tests and screening for cancer for at least 20 years after your treatment.   If Pap tests have been discontinued, risk factors (such as a new sexual partner) need to be reassessed to determine if screening should be resumed.   The HPV test is an additional test that may be used for cervical cancer screening. The HPV test looks for the virus that can cause the cell changes on the cervix.   The cells collected during the Pap test can be tested for HPV. The HPV test could be used to screen women aged 30 years and older, and should be used in women of any age who have unclear Pap test results. After the age of 30, women should have HPV testing at the same frequency as a Pap test.   Colorectal cancer can be detected and often prevented. Most routine colorectal cancer screening begins at the age of 50 and continues through age 75. However, your caregiver may recommend screening at an earlier age if you have risk factors for colon cancer. On a yearly basis, your caregiver may provide home test kits to check for hidden blood in the stool. Use of a small camera at the end of a tube, to directly examine the colon (sigmoidoscopy or colonoscopy), can detect the earliest forms of colorectal cancer. Talk to your caregiver about this at age 50, when routine screening begins. Direct examination of the colon should be repeated every 5 to 10 years through age 75, unless early forms of pre-cancerous polyps or small growths are found.   Hepatitis C blood testing is recommended for all people born from 1945 through 1965 and any individual with known risks for hepatitis C.    Practice safe sex. Use condoms and avoid high-risk sexual practices to reduce the spread of sexually transmitted infections (STIs). STIs include gonorrhea, chlamydia, syphilis, trichomonas, herpes, HPV, and human immunodeficiency virus (HIV). Herpes, HIV, and HPV are viral illnesses that have no cure. They can result in disability, cancer, and death. Sexually active women aged 25 and younger should be checked for chlamydia. Older women with new or multiple partners should also be tested for chlamydia. Testing for other STIs is recommended if you are sexually active and at increased risk.   Osteoporosis is a disease in which the bones lose minerals and strength with aging. This can result in serious bone fractures. The risk of osteoporosis can be identified using a bone density scan. Women ages 65 and over and women at risk for fractures or osteoporosis should discuss screening with their caregivers. Ask your caregiver whether you should take a calcium supplement or vitamin D to reduce the rate of osteoporosis.   Menopause can be associated with physical symptoms and risks. Hormone replacement therapy is available to decrease symptoms and risks. You should talk to your caregiver about whether hormone replacement therapy is right for you.   Use sunscreen with sun protection factor (SPF) of 30 or more. Apply sunscreen liberally and repeatedly throughout the day. You should seek shade when your shadow is shorter than you. Protect yourself by wearing long sleeves, pants, a wide-brimmed hat, and sunglasses year round, whenever you are outdoors.   Once a month, do a whole body skin exam, using a mirror to look at the skin on your back. Notify your caregiver of new moles, moles that have irregular borders, moles that are larger than a pencil eraser, or moles that have changed in shape or color.   Stay current with required immunizations.   Influenza. You need a dose every fall (or winter). The composition of  the flu vaccine changes each year, so being vaccinated once is not enough.   Pneumococcal polysaccharide. You need 1 to 2 doses if you smoke cigarettes or if you have certain chronic medical conditions. You need 1 dose at age 65 (or older) if you have never been vaccinated.   Tetanus, diphtheria, pertussis (Tdap, Td). Get 1 dose of   Tdap vaccine if you are younger than age 65, are over 65 and have contact with an infant, are a healthcare worker, are pregnant, or simply want to be protected from whooping cough. After that, you need a Td booster dose every 10 years. Consult your caregiver if you have not had at least 3 tetanus and diphtheria-containing shots sometime in your life or have a deep or dirty wound.   HPV. You need this vaccine if you are a woman age 26 or younger. The vaccine is given in 3 doses over 6 months.   Measles, mumps, rubella (MMR). You need at least 1 dose of MMR if you were born in 1957 or later. You may also need a second dose.   Meningococcal. If you are age 19 to 21 and a first-year college student living in a residence hall, or have one of several medical conditions, you need to get vaccinated against meningococcal disease. You may also need additional booster doses.   Zoster (shingles). If you are age 60 or older, you should get this vaccine.   Varicella (chickenpox). If you have never had chickenpox or you were vaccinated but received only 1 dose, talk to your caregiver to find out if you need this vaccine.   Hepatitis A. You need this vaccine if you have a specific risk factor for hepatitis A virus infection or you simply wish to be protected from this disease. The vaccine is usually given as 2 doses, 6 to 18 months apart.   Hepatitis B. You need this vaccine if you have a specific risk factor for hepatitis B virus infection or you simply wish to be protected from this disease. The vaccine is given in 3 doses, usually over 6 months.  Preventive Services /  Frequency Ages 19 to 39  Blood pressure check.** / Every 1 to 2 years.   Lipid and cholesterol check.** / Every 5 years beginning at age 20.   Clinical breast exam.** / Every 3 years for women in their 20s and 30s.   Pap test.** / Every 2 years from ages 21 through 29. Every 3 years starting at age 30 through age 65 or 70 with a history of 3 consecutive normal Pap tests.   HPV screening.** / Every 3 years from ages 30 through ages 65 to 70 with a history of 3 consecutive normal Pap tests.   Hepatitis C blood test.** / For any individual with known risks for hepatitis C.   Skin self-exam. / Monthly.   Influenza immunization.** / Every year.   Pneumococcal polysaccharide immunization.** / 1 to 2 doses if you smoke cigarettes or if you have certain chronic medical conditions.   Tetanus, diphtheria, pertussis (Tdap, Td) immunization. / A one-time dose of Tdap vaccine. After that, you need a Td booster dose every 10 years.   HPV immunization. / 3 doses over 6 months, if you are 26 and younger.   Measles, mumps, rubella (MMR) immunization. / You need at least 1 dose of MMR if you were born in 1957 or later. You may also need a second dose.   Meningococcal immunization. / 1 dose if you are age 19 to 21 and a first-year college student living in a residence hall, or have one of several medical conditions, you need to get vaccinated against meningococcal disease. You may also need additional booster doses.   Varicella immunization.** / Consult your caregiver.   Hepatitis A immunization.** / Consult your caregiver. 2 doses, 6 to 18 months   apart.   Hepatitis B immunization.** / Consult your caregiver. 3 doses usually over 6 months.  Ages 40 to 64  Blood pressure check.** / Every 1 to 2 years.   Lipid and cholesterol check.** / Every 5 years beginning at age 20.   Clinical breast exam.** / Every year after age 40.   Mammogram.** / Every year beginning at age 40 and continuing for as  long as you are in good health. Consult with your caregiver.   Pap test.** / Every 3 years starting at age 30 through age 65 or 70 with a history of 3 consecutive normal Pap tests.   HPV screening.** / Every 3 years from ages 30 through ages 65 to 70 with a history of 3 consecutive normal Pap tests.   Fecal occult blood test (FOBT) of stool. / Every year beginning at age 50 and continuing until age 75. You may not need to do this test if you get a colonoscopy every 10 years.   Flexible sigmoidoscopy or colonoscopy.** / Every 5 years for a flexible sigmoidoscopy or every 10 years for a colonoscopy beginning at age 50 and continuing until age 75.   Hepatitis C blood test.** / For all people born from 1945 through 1965 and any individual with known risks for hepatitis C.   Skin self-exam. / Monthly.   Influenza immunization.** / Every year.   Pneumococcal polysaccharide immunization.** / 1 to 2 doses if you smoke cigarettes or if you have certain chronic medical conditions.   Tetanus, diphtheria, pertussis (Tdap, Td) immunization.** / A one-time dose of Tdap vaccine. After that, you need a Td booster dose every 10 years.   Measles, mumps, rubella (MMR) immunization. / You need at least 1 dose of MMR if you were born in 1957 or later. You may also need a second dose.   Varicella immunization.** / Consult your caregiver.   Meningococcal immunization.** / Consult your caregiver.   Hepatitis A immunization.** / Consult your caregiver. 2 doses, 6 to 18 months apart.   Hepatitis B immunization.** / Consult your caregiver. 3 doses, usually over 6 months.  Ages 65 and over  Blood pressure check.** / Every 1 to 2 years.   Lipid and cholesterol check.** / Every 5 years beginning at age 20.   Clinical breast exam.** / Every year after age 40.   Mammogram.** / Every year beginning at age 40 and continuing for as long as you are in good health. Consult with your caregiver.   Pap test.** /  Every 3 years starting at age 30 through age 65 or 70 with a 3 consecutive normal Pap tests. Testing can be stopped between 65 and 70 with 3 consecutive normal Pap tests and no abnormal Pap or HPV tests in the past 10 years.   HPV screening.** / Every 3 years from ages 30 through ages 65 or 70 with a history of 3 consecutive normal Pap tests. Testing can be stopped between 65 and 70 with 3 consecutive normal Pap tests and no abnormal Pap or HPV tests in the past 10 years.   Fecal occult blood test (FOBT) of stool. / Every year beginning at age 50 and continuing until age 75. You may not need to do this test if you get a colonoscopy every 10 years.   Flexible sigmoidoscopy or colonoscopy.** / Every 5 years for a flexible sigmoidoscopy or every 10 years for a colonoscopy beginning at age 50 and continuing until age 75.   Hepatitis   C blood test.** / For all people born from [birth year] through 1965 and any individual with known risks for hepatitis C.   Osteoporosis screening.** / A one-time screening for women ages 37 and over and women at risk for fractures or osteoporosis.   Skin self-exam. / Monthly.   Influenza immunization.** / Every year.   Pneumococcal polysaccharide immunization.** / 1 dose at age 51 (or older) if you have never been vaccinated.   Tetanus, diphtheria, pertussis (Tdap, Td) immunization. / A one-time dose of Tdap vaccine if you are over 65 and have contact with an infant, are a Research scientist (physical sciences), or simply want to be protected from whooping cough. After that, you need a Td booster dose every 10 years.   Varicella immunization.** / Consult your caregiver.   Meningococcal immunization.** / Consult your caregiver.   Hepatitis A immunization.** / Consult your caregiver. 2 doses, 6 to 18 months apart.   Hepatitis B immunization.** / Check with your caregiver. 3 doses, usually over 6 months.  ** Family history and personal history of risk and conditions may change your caregiver's  recommendations. Document Released: 03/12/2001 Document Revised: 01/03/2011 Document Reviewed: 06/11/2010 [facility] Patient Information 2012 [place], [place].  Marijuana Abuse and Chemical Dependency WHEN IS DRUG USE A PROBLEM? Problems related to drug use usually begin with abuse of the substance and lead to dependency.  Abuse is repeated use of a drug with recurrent and significant negative consequences. Abuse happens anytime drug use is interfering with normal living activities including:   Failure to fulfill major obligations at work, school or home (poor work International aid/development worker, missing work or school and/or neglecting children and home).   Engaging in activities that are physically dangerous (driving a car or doing recreational activities such as swimming or rock climbing) while under the effects of the drug.   Recurrent drug-related legal problems (arrests for disorderly conduct or assault and battery).   Recurrent social or interpersonal problems caused or increased by the effects of the drug (arguments with family or friends, or physical fights).  Dependency has two parts.   You first develop an emotional/psychological dependence. Psychological dependence develops when your mind tells you that the drug is needed. You come to believe it helps you cope with life.   This is usually followed by physical dependence which has developed when continuing increases of drugs are required to get the same feeling or "high." This may result in:   Withdrawal symptoms such as shakes or tremors.   The substance being over a longer period of time than intended.   An ongoing desire, or unsuccessful effort to, cut down or control the use.   Greater amounts of time spent getting the drug, using the drug or recovering from the effects of the drug.   Important social, work or interests and activities are given up or reduced because or drug use.   Substance is used despite knowledge of ongoing physical  (ulcers) or psychological (depression) problems.  SIGNS OF CHEMICAL DEPENDENCY:  Friends or family say there is a problem.   Fighting when using drugs.   Having blackouts (not remembering what you do while using).   Feel sick from using drugs but continue using.   Lie about use or amounts of drugs used.   Need drugs to get you going.   Need drugs to relate to people or feel comfortable in social situations.   Use drugs to forget problems.  A "yes" answered to any of the above signs of chemical  dependency indicates there are problems. The longer the use of drugs continues, the greater the problems will become. If there is a family history of drug or alcohol use it is best not to experiment with drugs. Experimentation leads to tolerance. Addiction is followed by dependency where drugs are now needed not just to get high but to feel normal. Addiction cannot be cured but it can be stopped. This often requires outside help and the care of professionals. Treatment centers are listed in the yellow pages under: Cocaine, Narcotics, and Alcoholics anonymous. Most hospitals and clinics can refer you to a specialized care center. WHAT IS MARIJUANA? Marijuana is a plant which grows wild all over the world. The plant contains many chemicals but the active ingredient of the plant is THC (tetrahydrocannabinol). This is responsible for the "high" perceived by people using the drug. HOW IS MARIJUANA USED? Marijuana is smoked, eaten in brownies or any other food, and drank as a tea. WHAT ARE THE EFFECTS OF MARIJUANA? Marijuana is a nervous system depressant which slows the thinking process. Because of this effect, users think marijuana has a calming effect. Actually what happens is the air carrying tubules in the lung become relaxed and allow more oxygen to enter. This causes the user to feel high. The blood pressure falls so less blood reaches the brain and the heart speeds up. As the effects wear off the  user becomes depressed. Some people become very paranoid during use. They feel as though people are out to get them. Periodic use can interfere with performance at school or work. Generally Marijuana use does not develop into a physical dependence, but it is very habit forming. Marijuana is also seen as a gateway to use of harder drugs. Strong habits such as using Marijuana, as with all drugs and addictions, can only be helped by stopping use of all chemicals. This is hard but may save your life.  OTHER HEALTH RISKS OF MARIJUANA AND DRUG USE ARE: The increased possibility of getting AIDS or hepatitis (liver inflammation).  HOW TO STAY DRUG FREE ONCE YOU HAVE QUIT USING:  Develop healthy activities and form friends who do not use drugs.   Stay away from the drug scene.   Tell the those who want you to use drugs you have other, better things to do.   Have ready excuses available about why you cannot use.   Attend 12-Step Meetings for support from other recovering people.  FOR MORE HELP OR INFORMATION CONTACT YOUR LOCAL CAREGIVER, CLINIC, HOSPITAL OR DIAL 1-800-MARIJUANA [phone]). Document Released: 01/12/2000 Document Revised: 01/03/2011 Document Reviewed: 02/11/2007 [facility] Patient Information 2012 [place], [place].

## 2011-04-17 NOTE — Assessment & Plan Note (Signed)
Pt understands risks of using drug but will not stop smoking it

## 2011-04-17 NOTE — Progress Notes (Signed)
Subjective:    Toni [name] is a 66 y.o. female who presents for Medicare Annual/Subsequent preventive examination.  Preventive Screening-Counseling & Management  Tobacco History  Smoking status  . Current Everyday Smoker  . Types: Cigarettes  Smokeless tobacco  . Never Used  Comment: marijuana     Problems Prior to Visit 1.   Current Problems (verified) Patient Active Problem List  Diagnoses  . TINEA CORPORIS  . HYPERLIPIDEMIA  . DEPRESSION  . UNSPECIFIED CONJUNCTIVITIS  . HYPERTENSION  . COPD  . GERD  . POSTMENOPAUSAL STATUS  . OSTEOARTHRITIS  . LOW BACK PAIN, ACUTE  . MUSCLE PAIN  . FOOT PAIN, LEFT  . WEAKNESS  . HEARTBURN  . OTHER DYSPHAGIA  . CERVICAL STRAIN  . Personal History of Other Diseases of Digestive Disease  . TONSILLECTOMY AND ADENOIDECTOMY, HX OF  . UNSPECIFIED VITAMIN D DEFICIENCY  . UNSPECIFIED ANEMIA  . CLOSED FRACTURE OF SHAFT OF FEMUR  . ALCOHOL ABUSE, IN REMISSION, HX OF    Medications Prior to Visit Current Outpatient Prescriptions on File Prior to Visit  Medication Sig Dispense Refill  . CYMBALTA 60 MG capsule TAKE ONE CAPSULE BY MOUTH TWICE DAILY  60 capsule  1  . gabapentin (NEURONTIN) 600 MG tablet TAKE 1 TABLET BY MOUTH THREE TIMES DAILY  90 tablet  0  . simvastatin (ZOCOR) 20 MG tablet TAKE 1 TABLET BY MOUTH EVERY NIGHT AT BEDTIME  30 tablet  2    Current Medications (verified) Current Outpatient Prescriptions  Medication Sig Dispense Refill  . ALPRAZolam (XANAX) 0.25 MG tablet Take 0.25 mg by mouth at bedtime as needed.      Marland Kitchen amitriptyline (ELAVIL) 10 MG tablet Take 10 mg by mouth at bedtime.      . Biotin 1000 MCG tablet Take 1,000 mcg by mouth daily.      . Calcium-Magnesium-Vitamin D (CITRACAL CALCIUM+D) 600-40-500 MG-MG-UNIT TB24 Take 1 tablet by mouth 3 (three) times daily.      . cholecalciferol (VITAMIN D) 1000 UNITS tablet Take 1,000 Units by mouth 3 (three) times daily.      Marland Kitchen CINNAMON PO Take 1,000 mg by mouth  daily.      . cyanocobalamin 100 MCG tablet Take 100 mcg by mouth daily.      . cyanocobalamin 1000 MCG tablet Take 100 mcg by mouth daily.      . CYMBALTA 60 MG capsule TAKE ONE CAPSULE BY MOUTH TWICE DAILY  60 capsule  1  . gabapentin (NEURONTIN) 600 MG tablet TAKE 1 TABLET BY MOUTH THREE TIMES DAILY  90 tablet  0  . ibuprofen (ADVIL,MOTRIN) 600 MG tablet Take 600 mg by mouth every 6 (six) hours as needed.      . lamoTRIgine (LAMICTAL) 100 MG tablet Take 100 mg by mouth 2 (two) times daily.      . Milk Thistle 250 MG CAPS Take 1 capsule by mouth 2 (two) times daily.      . Multiple Vitamin (MULTIVITAMIN) tablet Take 1 tablet by mouth daily.      . Omega-3 Fatty Acids (FISH OIL) 1200 MG CAPS Take by mouth.      . Potassium 95 MG TBCR Take 1 tablet by mouth daily.      . simvastatin (ZOCOR) 20 MG tablet TAKE 1 TABLET BY MOUTH EVERY NIGHT AT BEDTIME  30 tablet  2  . Turmeric 500 MG CAPS Take by mouth.         Allergies (verified) Levofloxacin   PAST  HISTORY  Family History Family History  Problem Relation Age of Onset  . Stroke Mother   . Arthritis      mother family  . Depression      mother fanily  . Alcohol abuse      Both sides  . Cancer      mothers side    Social History History  Substance Use Topics  . Smoking status: Current Everyday Smoker    Types: Cigarettes  . Smokeless tobacco: Never Used   Comment: marijuana  . Alcohol Use: 1.0 oz/week    2 drink(s) per week     Are there smokers in your home (other than you)? No  Risk Factors Current exercise habits: The patient does not participate in regular exercise at present.  Dietary issues discussed: na   Cardiac risk factors: dyslipidemia, hypertension and sedentary lifestyle.  Depression Screen (Note: if answer to either of the following is "Yes", a more complete depression screening is indicated)   Over the past two weeks, have you felt down, depressed or hopeless? No  Over the past two weeks, have you  felt little interest or pleasure in doing things? No  Have you lost interest or pleasure in daily life? No  Do you often feel hopeless? No  Do you cry easily over simple problems? No  Activities of Daily Living In your present state of health, do you have any difficulty performing the following activities?:  Driving? No Managing money?  No Feeding yourself? No Getting from bed to chair? No Climbing a flight of stairs? No Preparing food and eating?: No Bathing or showering? No Getting dressed: No Getting to the toilet? No Using the toilet:No Moving around from place to place: No In the past year have you fallen or had a near fall?:No   Are you sexually active?  No  Do you have more than one partner?  No  Hearing Difficulties: No Do you often ask people to speak up or repeat themselves? No Do you experience ringing or noises in your ears? No Do you have difficulty understanding soft or whispered voices? No   Do you feel that you have a problem with memory? No  Do you often misplace items? No  Do you feel safe at home?  Yes  Cognitive Testing  Alert? Yes  Normal Appearance?Yes  Oriented to person? Yes  Place? Yes   Time? Yes  Recall of three objects?  Yes  Can perform simple calculations? Yes  Displays appropriate judgment?Yes  Can read the correct time from a watch face?Yes   Advanced Directives have been discussed with the patient? Yes  List the Names of Other Physician/Practitioners you currently use: 1.  Psych--- [facility]  Indicate any recent Medical Services you may have received from other than [facility] providers in the past year (date may be approximate).  Immunization History  Administered Date(s) Administered  . Influenza Whole 11/09/2008, 11/29/2010    Screening Tests Health Maintenance  Topic Date Due  . Tetanus/tdap  05/18/1965  . Colonoscopy  05/18/1996  . Zostavax  05/19/2006  . Mammogram  06/08/2011  . Influenza Vaccine  10/29/2011  . Pap Smear  04/17/2014      All answers were reviewed with the patient and necessary referrals were made:  Loreen Freud, DO   04/17/2011   History reviewed: allergies, current medications, past family history, past medical history, past social history, past surgical history and problem list  Review of Systems Review of Systems  Constitutional: Negative  for activity change, appetite change and fatigue.  HENT: Negative for hearing loss, congestion, tinnitus and ear discharge.  dentist-- due Eyes: Negative for visual disturbance (see optho --due) Respiratory: Negative for cough, chest tightness and shortness of breath.   Cardiovascular: Negative for chest pain, palpitations and leg swelling.  Gastrointestinal: Negative for abdominal pain, diarrhea, constipation and abdominal distention.  Genitourinary: Negative for urgency, frequency, decreased urine volume and difficulty urinating.  Musculoskeletal: Negative for back pain, arthralgias and gait problem.  Skin: Negative for color change, pallor and rash.  Neurological: Negative for dizziness, light-headedness, numbness and headaches.  Hematological: Negative for adenopathy. Does not bruise/bleed easily.  Psychiatric/Behavioral: Negative for suicidal ideas, confusion, sleep disturbance, self-injury, dysphoric mood, decreased concentration and agitation.        Objective:     Vision by Snellen chart:   Body mass index is 26.36 kg/(m^2). BP 136/82  Pulse 80  Temp(Src) 98 F (36.7 C) (Oral)  Ht 5\' 5"  (1.651 m)  Wt 158 lb 6.4 oz (71.85 kg)  BMI 26.36 kg/m2  SpO2 96%  BP 136/82  Pulse 80  Temp(Src) 98 F (36.7 C) (Oral)  Ht 5\' 5"  (1.651 m)  Wt 158 lb 6.4 oz (71.85 kg)  BMI 26.36 kg/m2  SpO2 96% General appearance: alert, cooperative, appears stated age and morbidly obese Head: Normocephalic, without obvious abnormality, atraumatic Eyes: conjunctivae/corneas clear. PERRL, EOM's intact. Fundi benign. Ears: normal TM's and external ear canals both  ears Nose: Nares normal. Septum midline. Mucosa normal. No drainage or sinus tenderness. Throat: lips, mucosa, and tongue normal; teeth and gums normal Neck: no adenopathy, no carotid bruit, no JVD, supple, symmetrical, trachea midline and thyroid not enlarged, symmetric, no tenderness/mass/nodules Back: symmetric, no curvature. ROM normal. No CVA tenderness. Lungs: clear to auscultation bilaterally Breasts: normal appearance, no masses or tenderness Heart: regular rate and rhythm, S1, S2 normal, no murmur, click, rub or gallop Abdomen: soft, non-tender; bowel sounds normal; no masses,  no organomegaly Pelvic: cervix normal in appearance, external genitalia normal, no adnexal masses or tenderness, no cervical motion tenderness, rectovaginal septum normal, uterus normal size, shape, and consistency and vagina normal without discharge Extremities: extremities normal, atraumatic, no cyanosis or edema Pulses: 2+ and symmetric Skin: Skin color, texture, turgor normal. No rashes or lesions Lymph nodes: Cervical, supraclavicular, and axillary nodes normal. Neurologic: Alert and oriented X 3, normal strength and tone. Normal symmetric reflexes. Normal coordination and gait No depression/ anxiety     Assessment:    CPE     Plan:     During the course of the visit the patient was educated and counseled about appropriate screening and preventive services including:    Pneumococcal vaccine   Influenza vaccine  Screening mammography  Screening Pap smear and pelvic exam   Colorectal cancer screening  Advanced directives: has an advanced directive - a copy HAS NOT been provided.  stop smoking marijuana  Diet review for nutrition referral? Yes ____  Not Indicated ___x_   Patient Instructions (the written plan) was given to the patient.  Medicare Attestation I have personally reviewed: The patient's medical and social history Their use of alcohol, tobacco or illicit drugs Their  current medications and supplements The patient's functional ability including ADLs,fall risks, home safety risks, cognitive, and hearing and visual impairment Diet and physical activities Evidence for depression or mood disorders  The patient's weight, height, BMI, and visual acuity have been recorded in the chart.  I have made referrals, counseling, and provided education to  the patient based on review of the above and I have provided the patient with a written personalized care plan for preventive services.     Loreen Freud, DO   04/17/2011

## 2011-04-17 NOTE — Assessment & Plan Note (Signed)
Still drinking 2-4 drinks a week

## 2011-04-17 NOTE — Assessment & Plan Note (Signed)
Stable con't meds 

## 2011-04-17 NOTE — Assessment & Plan Note (Signed)
con't meds Refer to psych 

## 2011-04-17 NOTE — Assessment & Plan Note (Signed)
con't meds  Check labs 

## 2011-04-19 ENCOUNTER — Encounter: Payer: Self-pay | Admitting: Gastroenterology

## 2011-04-19 ENCOUNTER — Encounter: Payer: Self-pay | Admitting: *Deleted

## 2011-05-09 ENCOUNTER — Encounter: Payer: Medicaid Other | Admitting: Family Medicine

## 2011-05-10 ENCOUNTER — Encounter: Payer: Self-pay | Admitting: Gastroenterology

## 2011-05-17 ENCOUNTER — Other Ambulatory Visit: Payer: Self-pay | Admitting: Family Medicine

## 2011-05-17 MED ORDER — METHOCARBAMOL 500 MG PO TABS: 500.0000 mg | ORAL_TABLET | Freq: Four times a day (QID) | ORAL | Status: DC

## 2011-05-17 NOTE — Telephone Encounter (Signed)
Patient called and states she moved here from [place] and just transferred to [facility]. She states she failed to mention a medication her [place]., dr. had her on Methocarbamol 500MG  qty of 60. I advised patient Dr.Lowne is out of the office til 5.1.13 but I would see if someone else would send and RX to [facility] on [address] for this medication. Patient PH# [phone]

## 2011-05-17 NOTE — Telephone Encounter (Signed)
Pt has been seeing Dr Laury Axon since 2007.  This is not on her previous med list.  Ok for #30, no refills.  Additional meds will need to be approved/provided by Dr Laury Axon

## 2011-05-17 NOTE — Telephone Encounter (Signed)
Done

## 2011-05-27 ENCOUNTER — Other Ambulatory Visit: Payer: Medicare Other | Admitting: Gastroenterology

## 2011-05-31 ENCOUNTER — Encounter: Payer: Self-pay | Admitting: Gastroenterology

## 2011-06-05 ENCOUNTER — Telehealth: Payer: Self-pay | Admitting: *Deleted

## 2011-06-05 DIAGNOSIS — G4700 Insomnia, unspecified: Secondary | ICD-10-CM

## 2011-06-05 NOTE — Telephone Encounter (Signed)
Patient request call back for questions regarding her medications [states not regarding refill]/SLS

## 2011-06-06 MED ORDER — ALPRAZOLAM 1 MG PO TABS: ORAL_TABLET | ORAL | Status: DC

## 2011-06-06 MED ORDER — AMITRIPTYLINE HCL 25 MG PO TABS: ORAL_TABLET | ORAL | Status: DC

## 2011-06-06 NOTE — Telephone Encounter (Signed)
Call from patient and she stated she has been getting 10 mg of the Elavil for a year and the dosing is wrong and her RX form Xanax is also incorect and she should have been getting 1 mg instead of 0.25 mg, I called the pharmacy and he pharmacist stated the patient was given a script by Dr. Fonnie Jarvis in July 2012 or the Elavil 25 mg 1-2 po prn and Xanax 1 mg 1/2 tablet po TID prn. Patient would like the scripts changed. Please advise     KP

## 2011-06-06 NOTE — Telephone Encounter (Signed)
nortryptyline 25 mg  1 -2 po qhs prn  #60   Xanax  1 mg  1/2 tab po tid prn  #30

## 2011-06-06 NOTE — Telephone Encounter (Signed)
Rx faxed.    KP 

## 2011-06-10 ENCOUNTER — Other Ambulatory Visit: Payer: Medicare Other | Admitting: Gastroenterology

## 2011-06-10 ENCOUNTER — Other Ambulatory Visit: Payer: Self-pay | Admitting: Family Medicine

## 2011-06-25 ENCOUNTER — Other Ambulatory Visit: Payer: Medicare Other | Admitting: Gastroenterology

## 2011-07-02 ENCOUNTER — Other Ambulatory Visit: Payer: Self-pay | Admitting: Family Medicine

## 2011-07-02 DIAGNOSIS — G4700 Insomnia, unspecified: Secondary | ICD-10-CM

## 2011-07-02 MED ORDER — AMITRIPTYLINE HCL 25 MG PO TABS: ORAL_TABLET | ORAL | Status: DC

## 2011-07-02 NOTE — Telephone Encounter (Signed)
Last seen 04/17/11 and filled 06/06/11 # 60. Please advise     KP

## 2011-07-02 NOTE — Telephone Encounter (Signed)
Refill x1 

## 2011-07-02 NOTE — Telephone Encounter (Signed)
refill amitriptyline 25mg  tablets Qty 60 Take 1 to 2 tablets by mouth at bedtime as needed Last fill 5.9.13 Last ov 3.20.13

## 2011-07-02 NOTE — Telephone Encounter (Signed)
Rx sent 

## 2011-07-17 ENCOUNTER — Encounter: Payer: Medicare Other | Admitting: Gastroenterology

## 2011-07-24 ENCOUNTER — Other Ambulatory Visit: Payer: Self-pay | Admitting: Family Medicine

## 2011-07-24 NOTE — Telephone Encounter (Signed)
Last seen 04/17/11 and filled 06/06/11 # 30. Please advise    KP

## 2011-07-25 MED ORDER — ALPRAZOLAM 1 MG PO TABS: 0.5000 mg | ORAL_TABLET | Freq: Three times a day (TID) | ORAL | Status: DC | PRN

## 2011-07-25 NOTE — Addendum Note (Signed)
Addended by: Arnette Norris on: 07/25/2011 08:10 AM   Modules accepted: Orders

## 2011-07-26 ENCOUNTER — Other Ambulatory Visit: Payer: Self-pay | Admitting: Family Medicine

## 2011-07-26 NOTE — Telephone Encounter (Signed)
last seen 04/17/11 and filled 05/17/11 # 30. Please advise    KP

## 2011-08-12 ENCOUNTER — Encounter: Payer: Self-pay | Admitting: Gastroenterology

## 2011-08-14 ENCOUNTER — Other Ambulatory Visit: Payer: Self-pay

## 2011-08-14 DIAGNOSIS — F319 Bipolar disorder, unspecified: Secondary | ICD-10-CM

## 2011-08-14 MED ORDER — LAMOTRIGINE 100 MG PO TABS: 100.0000 mg | ORAL_TABLET | Freq: Two times a day (BID) | ORAL | Status: DC

## 2011-08-14 NOTE — Telephone Encounter (Signed)
Last filled 6/14/1/3 # 60. Please advise    KP

## 2011-08-14 NOTE — Telephone Encounter (Signed)
Refill with 5 refills 

## 2011-09-17 ENCOUNTER — Ambulatory Visit: Payer: Medicare Other | Admitting: Licensed Clinical Social Worker

## 2011-09-17 ENCOUNTER — Other Ambulatory Visit: Payer: Self-pay | Admitting: Family Medicine

## 2011-10-17 ENCOUNTER — Other Ambulatory Visit: Payer: Self-pay | Admitting: Family Medicine

## 2011-11-12 DIAGNOSIS — Z23 Encounter for immunization: Secondary | ICD-10-CM | POA: Diagnosis not present

## 2011-11-24 ENCOUNTER — Other Ambulatory Visit: Payer: Self-pay | Admitting: Family Medicine

## 2011-11-25 NOTE — Telephone Encounter (Signed)
Last seen 04/17/11 and filled 07/25/11 # 30. Please advise     KP

## 2011-11-26 ENCOUNTER — Other Ambulatory Visit: Payer: Self-pay

## 2011-11-26 NOTE — Telephone Encounter (Signed)
Pt called concerning Xanax Rx I advised pt after looking at her chart and talking to Selena Batten that Rx sent last night to pharmacy. Pt stated understanding.     MW

## 2011-12-25 ENCOUNTER — Telehealth: Payer: Self-pay | Admitting: Family Medicine

## 2011-12-25 MED ORDER — DULOXETINE HCL 60 MG PO CPEP: ORAL_CAPSULE | ORAL | Status: DC

## 2011-12-25 NOTE — Telephone Encounter (Signed)
Early Refill: Cymbalta 60 mg capsules. Tk 1 c po bid. Qty 60. Last fill 12-17-11

## 2012-01-20 ENCOUNTER — Other Ambulatory Visit: Payer: Self-pay | Admitting: Family Medicine

## 2012-01-20 DIAGNOSIS — G629 Polyneuropathy, unspecified: Secondary | ICD-10-CM

## 2012-01-21 NOTE — Telephone Encounter (Signed)
Refill for neurontin sent to [facility].

## 2012-02-17 ENCOUNTER — Other Ambulatory Visit: Payer: Self-pay | Admitting: Family Medicine

## 2012-02-24 ENCOUNTER — Other Ambulatory Visit: Payer: Self-pay | Admitting: Family Medicine

## 2012-02-25 ENCOUNTER — Other Ambulatory Visit: Payer: Self-pay | Admitting: Family Medicine

## 2012-02-26 NOTE — Telephone Encounter (Signed)
Please offer this patient an apt, she has not been seen in and will need an OV if she needs medication refilled      KP

## 2012-02-27 MED ORDER — ALPRAZOLAM 1 MG PO TABS: 0.5000 mg | ORAL_TABLET | Freq: Three times a day (TID) | ORAL | Status: DC | PRN

## 2012-02-27 NOTE — Addendum Note (Signed)
Addended by: Arnette Norris on: 02/27/2012 08:20 AM   Modules accepted: Orders

## 2012-02-27 NOTE — Telephone Encounter (Addendum)
Letter mailed and a detailed message advising the patient to pick up Rx in the office left on VM     KP

## 2012-03-23 ENCOUNTER — Other Ambulatory Visit: Payer: Self-pay | Admitting: Family Medicine

## 2012-04-18 ENCOUNTER — Other Ambulatory Visit: Payer: Self-pay | Admitting: Family Medicine

## 2012-04-20 NOTE — Telephone Encounter (Signed)
Last OV 04-17-11, last filled 09-17-11, last filled 03-24-11 # 60 cymbalta, lamictal

## 2012-05-08 ENCOUNTER — Encounter: Payer: Self-pay | Admitting: Lab

## 2012-05-08 ENCOUNTER — Encounter: Payer: Medicare Other | Admitting: Family Medicine

## 2012-05-11 ENCOUNTER — Ambulatory Visit (INDEPENDENT_AMBULATORY_CARE_PROVIDER_SITE_OTHER): Payer: Medicare Other | Admitting: Family Medicine

## 2012-05-11 ENCOUNTER — Encounter: Payer: Self-pay | Admitting: Family Medicine

## 2012-05-11 ENCOUNTER — Encounter: Payer: Self-pay | Admitting: Gastroenterology

## 2012-05-11 ENCOUNTER — Other Ambulatory Visit (HOSPITAL_COMMUNITY)
Admission: RE | Admit: 2012-05-11 | Discharge: 2012-05-11 | Disposition: A | Discharge status: Home / Self Care | Payer: Medicare Other | Source: Ambulatory Visit | Attending: Family Medicine | Admitting: Family Medicine

## 2012-05-11 VITALS — BP 110/70 | HR 107 | Temp 99.0°F | Ht 64.75 in | Wt 184.0 lb

## 2012-05-11 DIAGNOSIS — R079 Chest pain, unspecified: Secondary | ICD-10-CM | POA: Diagnosis not present

## 2012-05-11 DIAGNOSIS — F1210 Cannabis abuse, uncomplicated: Secondary | ICD-10-CM

## 2012-05-11 DIAGNOSIS — Z1231 Encounter for screening mammogram for malignant neoplasm of breast: Secondary | ICD-10-CM

## 2012-05-11 DIAGNOSIS — E785 Hyperlipidemia, unspecified: Secondary | ICD-10-CM

## 2012-05-11 DIAGNOSIS — M25569 Pain in unspecified knee: Secondary | ICD-10-CM

## 2012-05-11 DIAGNOSIS — E2839 Other primary ovarian failure: Secondary | ICD-10-CM

## 2012-05-11 DIAGNOSIS — Z124 Encounter for screening for malignant neoplasm of cervix: Secondary | ICD-10-CM | POA: Diagnosis not present

## 2012-05-11 DIAGNOSIS — M25561 Pain in right knee: Secondary | ICD-10-CM

## 2012-05-11 DIAGNOSIS — Z1239 Encounter for other screening for malignant neoplasm of breast: Secondary | ICD-10-CM | POA: Diagnosis not present

## 2012-05-11 DIAGNOSIS — Z139 Encounter for screening, unspecified: Secondary | ICD-10-CM | POA: Diagnosis not present

## 2012-05-11 DIAGNOSIS — Z23 Encounter for immunization: Secondary | ICD-10-CM | POA: Diagnosis not present

## 2012-05-11 DIAGNOSIS — R0602 Shortness of breath: Secondary | ICD-10-CM

## 2012-05-11 DIAGNOSIS — I10 Essential (primary) hypertension: Secondary | ICD-10-CM

## 2012-05-11 DIAGNOSIS — Z0000 Encounter for general adult medical examination without abnormal findings: Secondary | ICD-10-CM | POA: Diagnosis not present

## 2012-05-11 DIAGNOSIS — M25559 Pain in unspecified hip: Secondary | ICD-10-CM | POA: Diagnosis not present

## 2012-05-11 DIAGNOSIS — M25551 Pain in right hip: Secondary | ICD-10-CM

## 2012-05-11 DIAGNOSIS — R5381 Other malaise: Secondary | ICD-10-CM

## 2012-05-11 DIAGNOSIS — F1021 Alcohol dependence, in remission: Secondary | ICD-10-CM

## 2012-05-11 DIAGNOSIS — F319 Bipolar disorder, unspecified: Secondary | ICD-10-CM

## 2012-05-11 LAB — CBC WITH DIFFERENTIAL/PLATELET
Basophils Relative: 0.3 % (ref 0.0–3.0)
Eosinophils Absolute: 0.1 10*3/uL (ref 0.0–0.7)
Eosinophils Relative: 1.3 % (ref 0.0–5.0)
Hemoglobin: 12.9 g/dL (ref 12.0–15.0)
MCHC: 34.3 g/dL (ref 30.0–36.0)
MCV: 94.6 fl (ref 78.0–100.0)
Monocytes Absolute: 0.4 10*3/uL (ref 0.1–1.0)
Neutro Abs: 3.1 10*3/uL (ref 1.4–7.7)
Neutrophils Relative %: 65.6 % (ref 43.0–77.0)
RBC: 3.96 Mil/uL (ref 3.87–5.11)
WBC: 4.7 10*3/uL (ref 4.5–10.5)

## 2012-05-11 LAB — POCT URINALYSIS DIPSTICK
Bilirubin, UA: NEGATIVE
Ketones, UA: NEGATIVE
Leukocytes, UA: NEGATIVE
Spec Grav, UA: 1.01

## 2012-05-11 LAB — BASIC METABOLIC PANEL
CO2: 23 mEq/L (ref 19–32)
Chloride: 101 mEq/L (ref 96–112)
Creatinine, Ser: 0.8 mg/dL (ref 0.4–1.2)
Potassium: 4.4 mEq/L (ref 3.5–5.1)
Sodium: 136 mEq/L (ref 135–145)

## 2012-05-11 LAB — TSH: TSH: 1.75 u[IU]/mL (ref 0.35–5.50)

## 2012-05-11 LAB — HEPATIC FUNCTION PANEL
ALT: 34 U/L (ref 0–35)
Albumin: 4.5 g/dL (ref 3.5–5.2)
Alkaline Phosphatase: 59 U/L (ref 39–117)
Bilirubin, Direct: 0.2 mg/dL (ref 0.0–0.3)
Total Protein: 7.6 g/dL (ref 6.0–8.3)

## 2012-05-11 LAB — LIPID PANEL: Triglycerides: 159 mg/dL — ABNORMAL HIGH (ref 0.0–149.0)

## 2012-05-11 NOTE — Assessment & Plan Note (Signed)
No change 

## 2012-05-11 NOTE — Progress Notes (Signed)
Subjective:    Toni [name] is a 67 y.o. female who presents for Medicare Annual/Subsequent preventive examination.  Preventive Screening-Counseling & Management  Tobacco History  Smoking status  . Current Every Day Smoker  Smokeless tobacco  . Never Used    Comment: marijuana     Problems Prior to Visit 1.   Current Problems (verified) Patient Active Problem List  Diagnosis  . TINEA CORPORIS  . HYPERLIPIDEMIA  . Bipolar 1 disorder  . UNSPECIFIED CONJUNCTIVITIS  . HYPERTENSION  . COPD  . GERD  . POSTMENOPAUSAL STATUS  . OSTEOARTHRITIS  . LOW BACK PAIN, ACUTE  . MUSCLE PAIN  . FOOT PAIN, LEFT  . WEAKNESS  . HEARTBURN  . OTHER DYSPHAGIA  . CERVICAL STRAIN  . Personal History of Other Diseases of Digestive Disease  . TONSILLECTOMY AND ADENOIDECTOMY, HX OF  . UNSPECIFIED VITAMIN D DEFICIENCY  . UNSPECIFIED ANEMIA  . CLOSED FRACTURE OF SHAFT OF FEMUR  . ALCOHOL ABUSE, IN REMISSION, HX OF  . Marijuana abuse    Medications Prior to Visit Current Outpatient Prescriptions on File Prior to Visit  Medication Sig Dispense Refill  . ALPRAZolam (XANAX) 1 MG tablet Take 0.5 tablets (0.5 mg total) by mouth 3 (three) times daily as needed for sleep.  30 tablet  0  . amitriptyline (ELAVIL) 25 MG tablet TAKE 1 TO 2 TABLETS BY MOUTH EVERY NIGHT AT BEDTIME AS NEEDED  60 tablet  0  . Biotin 1000 MCG tablet Take 1,000 mcg by mouth daily.      . cholecalciferol (VITAMIN D) 1000 UNITS tablet Take 1,000 Units by mouth 3 (three) times daily.      Marland Kitchen CINNAMON PO Take 1,000 mg by mouth daily.      . cyanocobalamin 1000 MCG tablet Take 100 mcg by mouth daily.      . DULoxetine (CYMBALTA) 60 MG capsule TAKE 1 CAPSULE BY MOUTH TWICE DAILY  60 capsule  0  . gabapentin (NEURONTIN) 600 MG tablet TAKE 1 TABLET BY MOUTH THREE TIMES DAILY  90 tablet  0  . ibuprofen (ADVIL,MOTRIN) 600 MG tablet Take 600 mg by mouth every 6 (six) hours as needed.      . lamoTRIgine (LAMICTAL) 100 MG tablet TAKE 1  TABLET BY MOUTH TWICE DAILY  60 tablet  0  . methocarbamol (ROBAXIN) 500 MG tablet TAKE 1 TABLET BY MOUTH FOUR TIMES DAILY  30 tablet  0  . Multiple Vitamin (MULTIVITAMIN) tablet Take 1 tablet by mouth daily.      . Omega-3 Fatty Acids (FISH OIL) 1200 MG CAPS Take by mouth.      . simvastatin (ZOCOR) 20 MG tablet TAKE 1 TABLET BY MOUTH EVERY NIGHT AT BEDTIME  30 tablet  5   No current facility-administered medications on file prior to visit.    Current Medications (verified) Current Outpatient Prescriptions  Medication Sig Dispense Refill  . ALPRAZolam (XANAX) 1 MG tablet Take 0.5 tablets (0.5 mg total) by mouth 3 (three) times daily as needed for sleep.  30 tablet  0  . amitriptyline (ELAVIL) 25 MG tablet TAKE 1 TO 2 TABLETS BY MOUTH EVERY NIGHT AT BEDTIME AS NEEDED  60 tablet  0  . B-COMPLEX CAPS Take 1 capsule by mouth daily.      . Biotin 1000 MCG tablet Take 1,000 mcg by mouth daily.      . Calcium Carbonate-Vit D-Min 1200-1000 MG-UNIT CHEW Chew 1 capsule by mouth daily.      . cholecalciferol (VITAMIN  D) 1000 UNITS tablet Take 1,000 Units by mouth 3 (three) times daily.      Marland Kitchen CINNAMON PO Take 1,000 mg by mouth daily.      . cyanocobalamin 1000 MCG tablet Take 100 mcg by mouth daily.      . DULoxetine (CYMBALTA) 60 MG capsule TAKE 1 CAPSULE BY MOUTH TWICE DAILY  60 capsule  0  . gabapentin (NEURONTIN) 600 MG tablet TAKE 1 TABLET BY MOUTH THREE TIMES DAILY  90 tablet  0  . ibuprofen (ADVIL,MOTRIN) 600 MG tablet Take 600 mg by mouth every 6 (six) hours as needed.      . lamoTRIgine (LAMICTAL) 100 MG tablet TAKE 1 TABLET BY MOUTH TWICE DAILY  60 tablet  0  . Magnesium 500 MG TABS Take 1 tablet by mouth daily.      . methocarbamol (ROBAXIN) 500 MG tablet TAKE 1 TABLET BY MOUTH FOUR TIMES DAILY  30 tablet  0  . Multiple Vitamin (MULTIVITAMIN) tablet Take 1 tablet by mouth daily.      . Omega-3 Fatty Acids (FISH OIL) 1200 MG CAPS Take by mouth.      . Selenium 200 MCG TABS Take 1 tablet  by mouth daily.      . simvastatin (ZOCOR) 20 MG tablet TAKE 1 TABLET BY MOUTH EVERY NIGHT AT BEDTIME  30 tablet  5   No current facility-administered medications for this visit.     Allergies (verified) Levofloxacin   PAST HISTORY  Family History Family History  Problem Relation Age of Onset  . Stroke Mother   . Arthritis      mother family  . Depression      mother fanily  . Alcohol abuse      Both sides  . Cancer      mothers side    Social History History  Substance Use Topics  . Smoking status: Current Every Day Smoker  . Smokeless tobacco: Never Used     Comment: marijuana  . Alcohol Use: 1.0 oz/week    2 drink(s) per week     Are there smokers in your home (other than you)? No  Risk Factors Current exercise habits: The patient does not participate in regular exercise at present.  Dietary issues discussed: na   Cardiac risk factors: advanced age (older than 80 for men, 61 for women), dyslipidemia, hypertension and obesity (BMI >= 30 kg/m2).  Depression Screen (Note: if answer to either of the following is "Yes", a more complete depression screening is indicated)   Over the past two weeks, have you felt down, depressed or hopeless? No  Over the past two weeks, have you felt little interest or pleasure in doing things? No  Have you lost interest or pleasure in daily life? No  Do you often feel hopeless? No  Do you cry easily over simple problems? No  Activities of Daily Living In your present state of health, do you have any difficulty performing the following activities?:  Driving? No Managing money?  No Feeding yourself? No Getting from bed to chair? No Climbing a flight of stairs? No Preparing food and eating?: No Bathing or showering? No Getting dressed: No Getting to the toilet? No Using the toilet:No Moving around from place to place: No In the past year have you fallen or had a near fall?:No   Are you sexually active?  Yes  Do you have  more than one partner?  No  Hearing Difficulties: No Do you often ask people  to speak up or repeat themselves? No Do you experience ringing or noises in your ears? No Do you have difficulty understanding soft or whispered voices? No   Do you feel that you have a problem with memory? No  Do you often misplace items? No  Do you feel safe at home?  Yes  Cognitive Testing  Alert? Yes  Normal Appearance?Yes  Oriented to person? Yes  Place? Yes   Time? Yes  Recall of three objects?  Yes  Can perform simple calculations? Yes  Displays appropriate judgment?Yes  Can read the correct time from a watch face?Yes   Advanced Directives have been discussed with the patient? Yes  List the Names of Other Physician/Practitioners you currently use: 1.  opth 2 dentist 3 GI--jacobs   Indicate any recent Medical Services you may have received from other than [facility] providers in the past year (date may be approximate).  Immunization History  Administered Date(s) Administered  . Influenza Whole 11/09/2008, 11/29/2010    Screening Tests Health Maintenance  Topic Date Due  . Tetanus/tdap  05/18/1965  . Colonoscopy  05/18/1996  . Zostavax  05/19/2006  . Pneumococcal Polysaccharide Vaccine Age 71 And Over  05/19/2011  . Mammogram  06/08/2011  . Influenza Vaccine  09/28/2012    All answers were reviewed with the patient and necessary referrals were made:  Loreen Freud, DO   05/11/2012   History reviewed:  She  has a past medical history of COPD (chronic obstructive pulmonary disease); Depression; Diverticulitis; Hyperlipidemia; Osteoarthritis; and Hypertension. She  does not have any pertinent problems on file. She  has past surgical history that includes Tonsillectomy and adenoidectomy; Total hip arthroplasty; Femur fracture surgery (09/2009); and Joint replacement (09/2009). Her family history includes Alcohol abuse in an unspecified family member; Arthritis in an unspecified family member;  Cancer in an unspecified family member; Depression in an unspecified family member; and Stroke in her mother. She  reports that she has been smoking.  She has never used smokeless tobacco. She reports that she drinks about 1.0 ounces of alcohol per week. She reports that she uses illicit drugs (Marijuana) about 14 times per week. She has a current medication list which includes the following prescription(s): alprazolam, amitriptyline, b-complex, biotin, calcium carbonate-vit d-min, cholecalciferol, cinnamon, cyanocobalamin, duloxetine, gabapentin, ibuprofen, lamotrigine, magnesium, methocarbamol, multivitamin, fish oil, selenium, and simvastatin. Current Outpatient Prescriptions on File Prior to Visit  Medication Sig Dispense Refill  . ALPRAZolam (XANAX) 1 MG tablet Take 0.5 tablets (0.5 mg total) by mouth 3 (three) times daily as needed for sleep.  30 tablet  0  . amitriptyline (ELAVIL) 25 MG tablet TAKE 1 TO 2 TABLETS BY MOUTH EVERY NIGHT AT BEDTIME AS NEEDED  60 tablet  0  . Biotin 1000 MCG tablet Take 1,000 mcg by mouth daily.      . cholecalciferol (VITAMIN D) 1000 UNITS tablet Take 1,000 Units by mouth 3 (three) times daily.      Marland Kitchen CINNAMON PO Take 1,000 mg by mouth daily.      . cyanocobalamin 1000 MCG tablet Take 100 mcg by mouth daily.      . DULoxetine (CYMBALTA) 60 MG capsule TAKE 1 CAPSULE BY MOUTH TWICE DAILY  60 capsule  0  . gabapentin (NEURONTIN) 600 MG tablet TAKE 1 TABLET BY MOUTH THREE TIMES DAILY  90 tablet  0  . ibuprofen (ADVIL,MOTRIN) 600 MG tablet Take 600 mg by mouth every 6 (six) hours as needed.      . lamoTRIgine (  LAMICTAL) 100 MG tablet TAKE 1 TABLET BY MOUTH TWICE DAILY  60 tablet  0  . methocarbamol (ROBAXIN) 500 MG tablet TAKE 1 TABLET BY MOUTH FOUR TIMES DAILY  30 tablet  0  . Multiple Vitamin (MULTIVITAMIN) tablet Take 1 tablet by mouth daily.      . Omega-3 Fatty Acids (FISH OIL) 1200 MG CAPS Take by mouth.      . simvastatin (ZOCOR) 20 MG tablet TAKE 1 TABLET BY  MOUTH EVERY NIGHT AT BEDTIME  30 tablet  5   No current facility-administered medications on file prior to visit.   She is allergic to levofloxacin.  Review of Systems  Review of Systems  Constitutional: Negative for activity change, appetite change and fatigue.  HENT: Negative for hearing loss, congestion, tinnitus and ear discharge.   Eyes: Negative for visual disturbance (see optho q1y -- vision corrected to 20/20 with glasses).  Respiratory: Negative for cough, chest tightness and shortness of breath.   Cardiovascular: Negative for chest pain, palpitations and leg swelling.  Gastrointestinal: Negative for abdominal pain, diarrhea, constipation and abdominal distention.  Genitourinary: Negative for urgency, frequency, decreased urine volume and difficulty urinating.  Musculoskeletal: Negative for back pain, arthralgias and gait problem.  Skin: Negative for color change, pallor and rash.  Neurological: Negative for dizziness, light-headedness, numbness and headaches.  Hematological: Negative for adenopathy. Does not bruise/bleed easily.  Psychiatric/Behavioral: Negative for suicidal ideas, confusion, sleep disturbance, self-injury, dysphoric mood, decreased concentration and agitation.  Pt is able to read and write and can do all ADLs No risk for falling No abuse/ violence in home      Objective:     Vision by Snellen ---opth  Body mass index is 30.84 kg/(m^2). BP 110/70  Pulse 107  Temp(Src) 99 F (37.2 C) (Oral)  Ht 5' 4.75" (1.645 m)  Wt 184 lb (83.462 kg)  BMI 30.84 kg/m2  SpO2 96%  BP 110/70  Pulse 107  Temp(Src) 99 F (37.2 C) (Oral)  Ht 5' 4.75" (1.645 m)  Wt 184 lb (83.462 kg)  BMI 30.84 kg/m2  SpO2 96% General appearance: alert, cooperative, appears stated age and no distress Head: Normocephalic, without obvious abnormality, atraumatic Eyes: conjunctivae/corneas clear. PERRL, EOM's intact. Fundi benign. Ears: normal TM's and external ear canals both  ears Nose: Nares normal. Septum midline. Mucosa normal. No drainage or sinus tenderness. Throat: lips, mucosa, and tongue normal; teeth and gums normal Neck: no adenopathy, no carotid bruit, no JVD, supple, symmetrical, trachea midline and thyroid not enlarged, symmetric, no tenderness/mass/nodules Back: symmetric, no curvature. ROM normal. No CVA tenderness. Lungs: clear to auscultation bilaterally Breasts: normal appearance, no masses or tenderness Heart: regular rate and rhythm, S1, S2 normal, no murmur, click, rub or gallop Abdomen: soft, non-tender; bowel sounds normal; no masses,  no organomegaly Pelvic: cervix normal in appearance, external genitalia normal, no adnexal masses or tenderness, no cervical motion tenderness, rectovaginal septum normal, uterus normal size, shape, and consistency and vagina normal without discharge Extremities: extremities normal, atraumatic, no cyanosis or edema Pulses: 2+ and symmetric Skin: Skin color, texture, turgor normal. No rashes or lesions Lymph nodes: Cervical, supraclavicular, and axillary nodes normal. Neurologic: Alert and oriented X 3, normal strength and tone. Normal symmetric reflexes. Normal coordination and gait Psych-- no depression, no anxiety      Assessment:     cpe     Plan:     During the course of the visit the patient was educated and counseled about appropriate screening and preventive  services including:    Pneumococcal vaccine   Screening electrocardiogram  Screening mammography  Screening Pap smear and pelvic exam   Bone densitometry screening  Colorectal cancer screening  Diabetes screening  Glaucoma screening  Nutrition counseling   Advanced directives: has an advanced directive - a copy HAS NOT been provided.  Diet review for nutrition referral? Yes ____  Not Indicated _x___   Patient Instructions (the written plan) was given to the patient.  Medicare Attestation I have personally  reviewed: The patient's medical and social history Their use of alcohol, tobacco or illicit drugs Their current medications and supplements The patient's functional ability including ADLs,fall risks, home safety risks, cognitive, and hearing and visual impairment Diet and physical activities Evidence for depression or mood disorders  The patient's weight, height, BMI, and visual acuity have been recorded in the chart.  I have made referrals, counseling, and provided education to the patient based on review of the above and I have provided the patient with a written personalized care plan for preventive services.     Loreen Freud, DO   05/11/2012

## 2012-05-11 NOTE — Patient Instructions (Signed)
Preventive Care for Adults, Female A healthy lifestyle and preventive care can promote health and wellness. Preventive health guidelines for women include the following key practices.  A routine yearly physical is a good way to check with your caregiver about your health and preventive screening. It is a chance to share any concerns and updates on your health, and to receive a thorough exam.  Visit your dentist for a routine exam and preventive care every 6 months. Brush your teeth twice a day and floss once a day. Good oral hygiene prevents tooth decay and gum disease.  The frequency of eye exams is based on your age, health, family medical history, use of contact lenses, and other factors. Follow your caregiver's recommendations for frequency of eye exams.  Eat a healthy diet. Foods like vegetables, fruits, whole grains, low-fat dairy products, and lean protein foods contain the nutrients you need without too many calories. Decrease your intake of foods high in solid fats, added sugars, and salt. Eat the right amount of calories for you.Get information about a proper diet from your caregiver, if necessary.  Regular physical exercise is one of the most important things you can do for your health. Most adults should get at least 150 minutes of moderate-intensity exercise (any activity that increases your heart rate and causes you to sweat) each week. In addition, most adults need muscle-strengthening exercises on 2 or more days a week.  Maintain a healthy weight. The body mass index (BMI) is a screening tool to identify possible weight problems. It provides an estimate of body fat based on height and weight. Your caregiver can help determine your BMI, and can help you achieve or maintain a healthy weight.For adults 20 years and older:  A BMI below 18.5 is considered underweight.  A BMI of 18.5 to 24.9 is normal.  A BMI of 25 to 29.9 is considered overweight.  A BMI of 30 and above is  considered obese.  Maintain normal blood lipids and cholesterol levels by exercising and minimizing your intake of saturated fat. Eat a balanced diet with plenty of fruit and vegetables. Blood tests for lipids and cholesterol should begin at age 20 and be repeated every 5 years. If your lipid or cholesterol levels are high, you are over 50, or you are at high risk for heart disease, you may need your cholesterol levels checked more frequently.Ongoing high lipid and cholesterol levels should be treated with medicines if diet and exercise are not effective.  If you smoke, find out from your caregiver how to quit. If you do not use tobacco, do not start.  If you are pregnant, do not drink alcohol. If you are breastfeeding, be very cautious about drinking alcohol. If you are not pregnant and choose to drink alcohol, do not exceed 1 drink per day. One drink is considered to be 12 ounces (355 mL) of beer, 5 ounces (148 mL) of wine, or 1.5 ounces (44 mL) of liquor.  Avoid use of street drugs. Do not share needles with anyone. Ask for help if you need support or instructions about stopping the use of drugs.  High blood pressure causes heart disease and increases the risk of stroke. Your blood pressure should be checked at least every 1 to 2 years. Ongoing high blood pressure should be treated with medicines if weight loss and exercise are not effective.  If you are 55 to 66 years old, ask your caregiver if you should take aspirin to prevent strokes.  Diabetes   screening involves taking a blood sample to check your fasting blood sugar level. This should be done once every 3 years, after age 45, if you are within normal weight and without risk factors for diabetes. Testing should be considered at a younger age or be carried out more frequently if you are overweight and have at least 1 risk factor for diabetes.  Breast cancer screening is essential preventive care for women. You should practice "breast  self-awareness." This means understanding the normal appearance and feel of your breasts and may include breast self-examination. Any changes detected, no matter how small, should be reported to a caregiver. Women in their 20s and 30s should have a clinical breast exam (CBE) by a caregiver as part of a regular health exam every 1 to 3 years. After age 40, women should have a CBE every year. Starting at age 40, women should consider having a mammography (breast X-ray test) every year. Women who have a family history of breast cancer should talk to their caregiver about genetic screening. Women at a high risk of breast cancer should talk to their caregivers about having magnetic resonance imaging (MRI) and a mammography every year.  The Pap test is a screening test for cervical cancer. A Pap test can show cell changes on the cervix that might become cervical cancer if left untreated. A Pap test is a procedure in which cells are obtained and examined from the lower end of the uterus (cervix).  Women should have a Pap test starting at age 21.  Between ages 21 and 29, Pap tests should be repeated every 2 years.  Beginning at age 30, you should have a Pap test every 3 years as long as the past 3 Pap tests have been normal.  Some women have medical problems that increase the chance of getting cervical cancer. Talk to your caregiver about these problems. It is especially important to talk to your caregiver if a new problem develops soon after your last Pap test. In these cases, your caregiver may recommend more frequent screening and Pap tests.  The above recommendations are the same for women who have or have not gotten the vaccine for human papillomavirus (HPV).  If you had a hysterectomy for a problem that was not cancer or a condition that could lead to cancer, then you no longer need Pap tests. Even if you no longer need a Pap test, a regular exam is a good idea to make sure no other problems are  starting.  If you are between ages 65 and 70, and you have had normal Pap tests going back 10 years, you no longer need Pap tests. Even if you no longer need a Pap test, a regular exam is a good idea to make sure no other problems are starting.  If you have had past treatment for cervical cancer or a condition that could lead to cancer, you need Pap tests and screening for cancer for at least 20 years after your treatment.  If Pap tests have been discontinued, risk factors (such as a new sexual partner) need to be reassessed to determine if screening should be resumed.  The HPV test is an additional test that may be used for cervical cancer screening. The HPV test looks for the virus that can cause the cell changes on the cervix. The cells collected during the Pap test can be tested for HPV. The HPV test could be used to screen women aged 30 years and older, and should   be used in women of any age who have unclear Pap test results. After the age of 30, women should have HPV testing at the same frequency as a Pap test.  Colorectal cancer can be detected and often prevented. Most routine colorectal cancer screening begins at the age of 50 and continues through age 75. However, your caregiver may recommend screening at an earlier age if you have risk factors for colon cancer. On a yearly basis, your caregiver may provide home test kits to check for hidden blood in the stool. Use of a small camera at the end of a tube, to directly examine the colon (sigmoidoscopy or colonoscopy), can detect the earliest forms of colorectal cancer. Talk to your caregiver about this at age 50, when routine screening begins. Direct examination of the colon should be repeated every 5 to 10 years through age 75, unless early forms of pre-cancerous polyps or small growths are found.  Hepatitis C blood testing is recommended for all people born from 1945 through 1965 and any individual with known risks for hepatitis C.  Practice  safe sex. Use condoms and avoid high-risk sexual practices to reduce the spread of sexually transmitted infections (STIs). STIs include gonorrhea, chlamydia, syphilis, trichomonas, herpes, HPV, and human immunodeficiency virus (HIV). Herpes, HIV, and HPV are viral illnesses that have no cure. They can result in disability, cancer, and death. Sexually active women aged 25 and younger should be checked for chlamydia. Older women with new or multiple partners should also be tested for chlamydia. Testing for other STIs is recommended if you are sexually active and at increased risk.  Osteoporosis is a disease in which the bones lose minerals and strength with aging. This can result in serious bone fractures. The risk of osteoporosis can be identified using a bone density scan. Women ages 65 and over and women at risk for fractures or osteoporosis should discuss screening with their caregivers. Ask your caregiver whether you should take a calcium supplement or vitamin D to reduce the rate of osteoporosis.  Menopause can be associated with physical symptoms and risks. Hormone replacement therapy is available to decrease symptoms and risks. You should talk to your caregiver about whether hormone replacement therapy is right for you.  Use sunscreen with sun protection factor (SPF) of 30 or more. Apply sunscreen liberally and repeatedly throughout the day. You should seek shade when your shadow is shorter than you. Protect yourself by wearing long sleeves, pants, a wide-brimmed hat, and sunglasses year round, whenever you are outdoors.  Once a month, do a whole body skin exam, using a mirror to look at the skin on your back. Notify your caregiver of new moles, moles that have irregular borders, moles that are larger than a pencil eraser, or moles that have changed in shape or color.  Stay current with required immunizations.  Influenza. You need a dose every fall (or winter). The composition of the flu vaccine  changes each year, so being vaccinated once is not enough.  Pneumococcal polysaccharide. You need 1 to 2 doses if you smoke cigarettes or if you have certain chronic medical conditions. You need 1 dose at age 65 (or older) if you have never been vaccinated.  Tetanus, diphtheria, pertussis (Tdap, Td). Get 1 dose of Tdap vaccine if you are younger than age 65, are over 65 and have contact with an infant, are a healthcare worker, are pregnant, or simply want to be protected from whooping cough. After that, you need a Td   booster dose every 10 years. Consult your caregiver if you have not had at least 3 tetanus and diphtheria-containing shots sometime in your life or have a deep or dirty wound.  HPV. You need this vaccine if you are a woman age 26 or younger. The vaccine is given in 3 doses over 6 months.  Measles, mumps, rubella (MMR). You need at least 1 dose of MMR if you were born in 1957 or later. You may also need a second dose.  Meningococcal. If you are age 19 to 21 and a first-year college student living in a residence hall, or have one of several medical conditions, you need to get vaccinated against meningococcal disease. You may also need additional booster doses.  Zoster (shingles). If you are age 60 or older, you should get this vaccine.  Varicella (chickenpox). If you have never had chickenpox or you were vaccinated but received only 1 dose, talk to your caregiver to find out if you need this vaccine.  Hepatitis A. You need this vaccine if you have a specific risk factor for hepatitis A virus infection or you simply wish to be protected from this disease. The vaccine is usually given as 2 doses, 6 to 18 months apart.  Hepatitis B. You need this vaccine if you have a specific risk factor for hepatitis B virus infection or you simply wish to be protected from this disease. The vaccine is given in 3 doses, usually over 6 months. Preventive Services / Frequency Ages 19 to 39  Blood  pressure check.** / Every 1 to 2 years.  Lipid and cholesterol check.** / Every 5 years beginning at age 20.  Clinical breast exam.** / Every 3 years for women in their 20s and 30s.  Pap test.** / Every 2 years from ages 21 through 29. Every 3 years starting at age 30 through age 65 or 70 with a history of 3 consecutive normal Pap tests.  HPV screening.** / Every 3 years from ages 30 through ages 65 to 70 with a history of 3 consecutive normal Pap tests.  Hepatitis C blood test.** / For any individual with known risks for hepatitis C.  Skin self-exam. / Monthly.  Influenza immunization.** / Every year.  Pneumococcal polysaccharide immunization.** / 1 to 2 doses if you smoke cigarettes or if you have certain chronic medical conditions.  Tetanus, diphtheria, pertussis (Tdap, Td) immunization. / A one-time dose of Tdap vaccine. After that, you need a Td booster dose every 10 years.  HPV immunization. / 3 doses over 6 months, if you are 26 and younger.  Measles, mumps, rubella (MMR) immunization. / You need at least 1 dose of MMR if you were born in 1957 or later. You may also need a second dose.  Meningococcal immunization. / 1 dose if you are age 19 to 21 and a first-year college student living in a residence hall, or have one of several medical conditions, you need to get vaccinated against meningococcal disease. You may also need additional booster doses.  Varicella immunization.** / Consult your caregiver.  Hepatitis A immunization.** / Consult your caregiver. 2 doses, 6 to 18 months apart.  Hepatitis B immunization.** / Consult your caregiver. 3 doses usually over 6 months. Ages 40 to 64  Blood pressure check.** / Every 1 to 2 years.  Lipid and cholesterol check.** / Every 5 years beginning at age 20.  Clinical breast exam.** / Every year after age 40.  Mammogram.** / Every year beginning at age 40   and continuing for as long as you are in good health. Consult with your  caregiver.  Pap test.** / Every 3 years starting at age 30 through age 65 or 70 with a history of 3 consecutive normal Pap tests.  HPV screening.** / Every 3 years from ages 30 through ages 65 to 70 with a history of 3 consecutive normal Pap tests.  Fecal occult blood test (FOBT) of stool. / Every year beginning at age 50 and continuing until age 75. You may not need to do this test if you get a colonoscopy every 10 years.  Flexible sigmoidoscopy or colonoscopy.** / Every 5 years for a flexible sigmoidoscopy or every 10 years for a colonoscopy beginning at age 50 and continuing until age 75.  Hepatitis C blood test.** / For all people born from 1945 through 1965 and any individual with known risks for hepatitis C.  Skin self-exam. / Monthly.  Influenza immunization.** / Every year.  Pneumococcal polysaccharide immunization.** / 1 to 2 doses if you smoke cigarettes or if you have certain chronic medical conditions.  Tetanus, diphtheria, pertussis (Tdap, Td) immunization.** / A one-time dose of Tdap vaccine. After that, you need a Td booster dose every 10 years.  Measles, mumps, rubella (MMR) immunization. / You need at least 1 dose of MMR if you were born in 1957 or later. You may also need a second dose.  Varicella immunization.** / Consult your caregiver.  Meningococcal immunization.** / Consult your caregiver.  Hepatitis A immunization.** / Consult your caregiver. 2 doses, 6 to 18 months apart.  Hepatitis B immunization.** / Consult your caregiver. 3 doses, usually over 6 months. Ages 65 and over  Blood pressure check.** / Every 1 to 2 years.  Lipid and cholesterol check.** / Every 5 years beginning at age 20.  Clinical breast exam.** / Every year after age 40.  Mammogram.** / Every year beginning at age 40 and continuing for as long as you are in good health. Consult with your caregiver.  Pap test.** / Every 3 years starting at age 30 through age 65 or 70 with a 3  consecutive normal Pap tests. Testing can be stopped between 65 and 70 with 3 consecutive normal Pap tests and no abnormal Pap or HPV tests in the past 10 years.  HPV screening.** / Every 3 years from ages 30 through ages 65 or 70 with a history of 3 consecutive normal Pap tests. Testing can be stopped between 65 and 70 with 3 consecutive normal Pap tests and no abnormal Pap or HPV tests in the past 10 years.  Fecal occult blood test (FOBT) of stool. / Every year beginning at age 50 and continuing until age 75. You may not need to do this test if you get a colonoscopy every 10 years.  Flexible sigmoidoscopy or colonoscopy.** / Every 5 years for a flexible sigmoidoscopy or every 10 years for a colonoscopy beginning at age 50 and continuing until age 75.  Hepatitis C blood test.** / For all people born from 1945 through 1965 and any individual with known risks for hepatitis C.  Osteoporosis screening.** / A one-time screening for women ages 65 and over and women at risk for fractures or osteoporosis.  Skin self-exam. / Monthly.  Influenza immunization.** / Every year.  Pneumococcal polysaccharide immunization.** / 1 dose at age 65 (or older) if you have never been vaccinated.  Tetanus, diphtheria, pertussis (Tdap, Td) immunization. / A one-time dose of Tdap vaccine if you are over   65 and have contact with an infant, are a healthcare worker, or simply want to be protected from whooping cough. After that, you need a Td booster dose every 10 years.  Varicella immunization.** / Consult your caregiver.  Meningococcal immunization.** / Consult your caregiver.  Hepatitis A immunization.** / Consult your caregiver. 2 doses, 6 to 18 months apart.  Hepatitis B immunization.** / Check with your caregiver. 3 doses, usually over 6 months. ** Family history and personal history of risk and conditions may change your caregiver's recommendations. Document Released: 03/12/2001 Document Revised: 04/08/2011  Document Reviewed: 06/11/2010 [employer] Patient Information 2013 [employer].  

## 2012-05-11 NOTE — Assessment & Plan Note (Signed)
Stable con't meds 

## 2012-05-11 NOTE — Assessment & Plan Note (Signed)
Check labs con't meds 

## 2012-05-11 NOTE — Assessment & Plan Note (Signed)
Per psych 

## 2012-05-13 DIAGNOSIS — Z79899 Other long term (current) drug therapy: Secondary | ICD-10-CM | POA: Diagnosis not present

## 2012-05-14 ENCOUNTER — Other Ambulatory Visit (HOSPITAL_COMMUNITY): Payer: Medicare Other

## 2012-05-19 ENCOUNTER — Other Ambulatory Visit (HOSPITAL_COMMUNITY): Payer: Medicare Other

## 2012-05-25 ENCOUNTER — Telehealth: Payer: Self-pay | Admitting: Family Medicine

## 2012-05-27 ENCOUNTER — Other Ambulatory Visit: Payer: Self-pay

## 2012-05-27 MED ORDER — SIMVASTATIN 20 MG PO TABS: ORAL_TABLET | ORAL | Status: DC

## 2012-05-27 NOTE — Telephone Encounter (Signed)
Patient is requesting a refill of Methocarbamol 500 and would like an increase in quantity. She last had it filled in June 2013 and has 1 left. Please advise     KP

## 2012-05-28 MED ORDER — METHOCARBAMOL 500 MG PO TABS: ORAL_TABLET | ORAL | Status: DC

## 2012-05-29 ENCOUNTER — Other Ambulatory Visit: Payer: Self-pay | Admitting: Family Medicine

## 2012-05-29 ENCOUNTER — Telehealth: Payer: Self-pay

## 2012-05-29 DIAGNOSIS — G609 Hereditary and idiopathic neuropathy, unspecified: Secondary | ICD-10-CM

## 2012-05-29 DIAGNOSIS — F32A Depression, unspecified: Secondary | ICD-10-CM

## 2012-05-29 DIAGNOSIS — R569 Unspecified convulsions: Secondary | ICD-10-CM

## 2012-05-29 DIAGNOSIS — F329 Major depressive disorder, single episode, unspecified: Secondary | ICD-10-CM

## 2012-05-29 MED ORDER — TIZANIDINE HCL 4 MG PO TABS: 4.0000 mg | ORAL_TABLET | Freq: Four times a day (QID) | ORAL | Status: DC | PRN

## 2012-05-29 NOTE — Telephone Encounter (Signed)
Call from the patient and she stated that her Robaxin is no longer covered and she did not want to wait for a PA, she would like for Dr.Lowne to change the prescription. Zanaflex 4 mg 1-2 po q6h prn #30 was sent as an alternative.      KP

## 2012-05-29 NOTE — Telephone Encounter (Signed)
Attempted to contact patient to find our why she missed her ECHO appointment, lmovm for her to return my call so that we could get this rescheduled.   Refills for Lamictal, Cymbalta and Neurontin sent to [facility] on [address]

## 2012-05-30 ENCOUNTER — Encounter: Payer: Self-pay | Admitting: Family Medicine

## 2012-06-12 ENCOUNTER — Other Ambulatory Visit: Payer: Self-pay | Admitting: Family Medicine

## 2012-06-16 ENCOUNTER — Telehealth: Payer: Self-pay | Admitting: *Deleted

## 2012-06-16 NOTE — Telephone Encounter (Signed)
Reviewed with Dr. Christella Hartigan the need for MAC sedation for this patient. Per Dr. Christella Hartigan patient needs to be rescheduled to a day when Propofol can be used.

## 2012-06-17 ENCOUNTER — Ambulatory Visit (AMBULATORY_SURGERY_CENTER): Payer: Medicare Other | Admitting: *Deleted

## 2012-06-17 ENCOUNTER — Telehealth: Payer: Self-pay | Admitting: *Deleted

## 2012-06-17 VITALS — Ht 62.5 in | Wt 182.8 lb

## 2012-06-17 DIAGNOSIS — Z1211 Encounter for screening for malignant neoplasm of colon: Secondary | ICD-10-CM

## 2012-06-17 MED ORDER — MOVIPREP 100 G PO SOLR: ORAL | Status: DC

## 2012-06-17 NOTE — Telephone Encounter (Signed)
Talked with pt.  She will call to reschedule colonoscopy after 7/2 OV with Dr. Melburn Popper.

## 2012-06-17 NOTE — Telephone Encounter (Signed)
Good pick up. Thanks  If she is seeing a cardiologist for chest pain, should postpone the colonosocpy until after that evaluation.

## 2012-06-17 NOTE — Telephone Encounter (Signed)
Dr Christella Hartigan: pt is scheduled for direct screening colonoscopy with Propofol on 6/18.  Pt has appt with Dr. Melburn Popper 7/2 because of unspecified chest pain. Pt had OV with Dr. Laury Axon on 4/14, chest pain was mentioned in problem list but did not see it documented in visit notes.  Does she need to wait to have colonoscopy until after visit with Dr. Melburn Popper or is she okay for colonoscopy as scheduled? Thanks, Olegario Messier in [facility]

## 2012-06-17 NOTE — Telephone Encounter (Signed)
Left message for patient to call back  

## 2012-06-24 ENCOUNTER — Other Ambulatory Visit: Payer: Self-pay | Admitting: Family Medicine

## 2012-06-24 NOTE — Telephone Encounter (Signed)
Last seen 05/11/12 and filled 02/17/12 #30. Please advise      KP

## 2012-06-30 ENCOUNTER — Ambulatory Visit
Admission: RE | Admit: 2012-06-30 | Discharge: 2012-06-30 | Disposition: A | Discharge status: Home / Self Care | Payer: Medicare Other | Source: Ambulatory Visit | Attending: Family Medicine | Admitting: Family Medicine

## 2012-06-30 ENCOUNTER — Ambulatory Visit: Payer: Medicare Other

## 2012-06-30 DIAGNOSIS — E2839 Other primary ovarian failure: Secondary | ICD-10-CM

## 2012-06-30 DIAGNOSIS — Z1231 Encounter for screening mammogram for malignant neoplasm of breast: Secondary | ICD-10-CM | POA: Diagnosis not present

## 2012-06-30 DIAGNOSIS — Z780 Asymptomatic menopausal state: Secondary | ICD-10-CM | POA: Diagnosis not present

## 2012-07-01 ENCOUNTER — Other Ambulatory Visit: Payer: Medicare Other | Admitting: Gastroenterology

## 2012-07-15 ENCOUNTER — Encounter: Payer: Medicare Other | Admitting: Gastroenterology

## 2012-07-20 ENCOUNTER — Encounter: Payer: Self-pay | Admitting: Family Medicine

## 2012-07-21 ENCOUNTER — Other Ambulatory Visit: Payer: Self-pay | Admitting: Family Medicine

## 2012-07-21 NOTE — Telephone Encounter (Signed)
Last seen 05/11/12 and filled 05/29/12 #30. Please advise     KP

## 2012-07-22 ENCOUNTER — Telehealth: Payer: Self-pay | Admitting: *Deleted

## 2012-07-22 MED ORDER — ALPRAZOLAM 1 MG PO TABS: ORAL_TABLET | ORAL | Status: DC

## 2012-07-22 NOTE — Telephone Encounter (Signed)
Last OV 05-11-12, Last refilled 06-24-12 #30

## 2012-07-22 NOTE — Telephone Encounter (Signed)
Ok to refill x 1  

## 2012-07-29 ENCOUNTER — Ambulatory Visit: Payer: Medicare Other | Admitting: Cardiovascular Disease

## 2012-08-06 ENCOUNTER — Telehealth: Payer: Self-pay | Admitting: *Deleted

## 2012-08-06 NOTE — Telephone Encounter (Signed)
Left message to call office to advise Pt that x-ray are still in system and julie contact info [phone]

## 2012-08-06 NOTE — Telephone Encounter (Signed)
Order is still in computer She can call  [facility] to make an appointment with Berniece Andreas.
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
## 2012-08-13 NOTE — Telephone Encounter (Signed)
Discuss with patient  

## 2012-08-20 ENCOUNTER — Other Ambulatory Visit: Payer: Self-pay | Admitting: Family Medicine

## 2012-08-21 NOTE — Telephone Encounter (Signed)
Last seen 05/11/12 and filled 07/22/12 #30. Please advise     KP

## 2012-08-27 ENCOUNTER — Telehealth: Payer: Self-pay | Admitting: *Deleted

## 2012-08-27 NOTE — Telephone Encounter (Signed)
Pharmacy's requesting a refill for Alprazolam 1 mg patient has a controlled substance contract on file and this a lowne patient  Last ov 05/11/12 last date filled 07/22/12 30 ct please advise.

## 2012-08-27 NOTE — Telephone Encounter (Signed)
When was the last UDS? Results?

## 2012-08-27 NOTE — Telephone Encounter (Signed)
Last UDS was done 01/14/2007.

## 2012-08-28 ENCOUNTER — Other Ambulatory Visit: Payer: Self-pay | Admitting: *Deleted

## 2012-08-28 DIAGNOSIS — G4700 Insomnia, unspecified: Secondary | ICD-10-CM

## 2012-08-28 MED ORDER — ALPRAZOLAM 1 MG PO TABS: ORAL_TABLET | ORAL | Status: DC

## 2012-08-28 NOTE — Telephone Encounter (Signed)
30, no RF

## 2012-08-28 NOTE — Telephone Encounter (Signed)
Rx for alprazolam printed and faxed.

## 2012-08-28 NOTE — Telephone Encounter (Signed)
Last UDS 04-2012, ok #0, 0 RF

## 2012-08-28 NOTE — Telephone Encounter (Signed)
Please clarify quantity for refill. Prior documentation states # 0. Thanks.

## 2012-09-25 ENCOUNTER — Other Ambulatory Visit: Payer: Self-pay | Admitting: Family Medicine

## 2012-09-25 NOTE — Telephone Encounter (Signed)
Last seen and UDS complete on 05/11/12. Low risk. Please advise       KP

## 2012-10-01 ENCOUNTER — Ambulatory Visit: Payer: Medicare Other | Admitting: Internal Medicine

## 2012-10-26 ENCOUNTER — Other Ambulatory Visit: Payer: Self-pay | Admitting: Family Medicine

## 2012-11-25 ENCOUNTER — Telehealth: Payer: Self-pay | Admitting: Family Medicine

## 2012-11-25 DIAGNOSIS — R0602 Shortness of breath: Secondary | ICD-10-CM

## 2012-11-25 DIAGNOSIS — R079 Chest pain, unspecified: Secondary | ICD-10-CM

## 2012-11-25 NOTE — Telephone Encounter (Signed)
We can put a new referral in but pt needs to go.  If she no shows again--- they will most likely refuse to reschedule.

## 2012-11-25 NOTE — Telephone Encounter (Signed)
Please advise      KP 

## 2012-11-25 NOTE — Telephone Encounter (Signed)
Patient was referred to [facility] and was a no-show for 2 consult appointments. Patient says "sometimes I wake up and it is just hard for me to get out the house."   Per patient, they will not allow her to reschedule unless an updated referral is sent. Please advise.

## 2012-11-26 NOTE — Telephone Encounter (Signed)
Ref put in--patient is aware and has agreed to keep apt.      KP

## 2012-11-27 ENCOUNTER — Other Ambulatory Visit: Payer: Self-pay | Admitting: Family Medicine

## 2012-11-27 NOTE — Telephone Encounter (Signed)
Last seen 05/11/12 and filled 09/25/12 #30. Please advise     KP

## 2012-12-01 ENCOUNTER — Ambulatory Visit: Payer: Medicare Other | Admitting: Interventional Cardiology

## 2012-12-15 ENCOUNTER — Other Ambulatory Visit: Payer: Self-pay | Admitting: Family Medicine

## 2012-12-15 ENCOUNTER — Telehealth: Payer: Self-pay | Admitting: *Deleted

## 2012-12-15 NOTE — Telephone Encounter (Signed)
Patient called and stated that she has been depressed lately. She is also complaining of some nausea and lightheadedness. Patient that sometimes just don't feel like getting out of bed sometimes. Patient made an appointment with provider for Thursday.

## 2012-12-15 NOTE — Telephone Encounter (Signed)
Last seen 05/01/12 and filled 09/25/12 #30. Please advise       KP

## 2012-12-17 ENCOUNTER — Emergency Department (HOSPITAL_COMMUNITY)
Admission: EM | Admit: 2012-12-17 | Discharge: 2012-12-18 | Disposition: A | Discharge status: Home / Self Care | Payer: Medicare Other | Attending: Emergency Medicine | Admitting: Emergency Medicine

## 2012-12-17 ENCOUNTER — Ambulatory Visit (INDEPENDENT_AMBULATORY_CARE_PROVIDER_SITE_OTHER): Payer: Medicare Other | Admitting: Family Medicine

## 2012-12-17 ENCOUNTER — Other Ambulatory Visit: Payer: Self-pay

## 2012-12-17 ENCOUNTER — Emergency Department (HOSPITAL_COMMUNITY): Payer: Medicare Other

## 2012-12-17 ENCOUNTER — Encounter (HOSPITAL_COMMUNITY): Payer: Self-pay | Admitting: Emergency Medicine

## 2012-12-17 ENCOUNTER — Encounter: Payer: Self-pay | Admitting: Family Medicine

## 2012-12-17 ENCOUNTER — Ambulatory Visit (HOSPITAL_BASED_OUTPATIENT_CLINIC_OR_DEPARTMENT_OTHER)
Admission: RE | Admit: 2012-12-17 | Discharge: 2012-12-17 | Disposition: A | Discharge status: Home / Self Care | Payer: Medicare Other | Source: Ambulatory Visit | Attending: Family Medicine | Admitting: Family Medicine

## 2012-12-17 VITALS — BP 120/70 | HR 96 | Temp 97.7°F | Wt 166.0 lb

## 2012-12-17 DIAGNOSIS — R5381 Other malaise: Secondary | ICD-10-CM

## 2012-12-17 DIAGNOSIS — R0602 Shortness of breath: Secondary | ICD-10-CM | POA: Insufficient documentation

## 2012-12-17 DIAGNOSIS — Z79899 Other long term (current) drug therapy: Secondary | ICD-10-CM | POA: Diagnosis not present

## 2012-12-17 DIAGNOSIS — I10 Essential (primary) hypertension: Secondary | ICD-10-CM | POA: Diagnosis not present

## 2012-12-17 DIAGNOSIS — F319 Bipolar disorder, unspecified: Secondary | ICD-10-CM | POA: Insufficient documentation

## 2012-12-17 DIAGNOSIS — E785 Hyperlipidemia, unspecified: Secondary | ICD-10-CM | POA: Insufficient documentation

## 2012-12-17 DIAGNOSIS — J441 Chronic obstructive pulmonary disease with (acute) exacerbation: Secondary | ICD-10-CM | POA: Insufficient documentation

## 2012-12-17 DIAGNOSIS — R002 Palpitations: Secondary | ICD-10-CM

## 2012-12-17 DIAGNOSIS — M1990 Unspecified osteoarthritis, unspecified site: Secondary | ICD-10-CM | POA: Insufficient documentation

## 2012-12-17 DIAGNOSIS — Z87891 Personal history of nicotine dependence: Secondary | ICD-10-CM | POA: Insufficient documentation

## 2012-12-17 DIAGNOSIS — R209 Unspecified disturbances of skin sensation: Secondary | ICD-10-CM

## 2012-12-17 DIAGNOSIS — R200 Anesthesia of skin: Secondary | ICD-10-CM

## 2012-12-17 DIAGNOSIS — Z8719 Personal history of other diseases of the digestive system: Secondary | ICD-10-CM | POA: Insufficient documentation

## 2012-12-17 DIAGNOSIS — R42 Dizziness and giddiness: Secondary | ICD-10-CM

## 2012-12-17 LAB — POCT I-STAT TROPONIN I: Troponin i, poc: 0.01 ng/mL (ref 0.00–0.08)

## 2012-12-17 LAB — CBC WITH DIFFERENTIAL/PLATELET
Basophils Absolute: 0 10*3/uL (ref 0.0–0.1)
Eosinophils Relative: 2 % (ref 0–5)
Lymphocytes Relative: 44 % (ref 12–46)
Lymphs Abs: 3.6 10*3/uL (ref 0.7–4.0)
Neutro Abs: 3.8 10*3/uL (ref 1.7–7.7)
Neutrophils Relative %: 46 % (ref 43–77)
Platelets: 312 10*3/uL (ref 150–400)
RBC: 4.13 MIL/uL (ref 3.87–5.11)
RDW: 13.7 % (ref 11.5–15.5)
WBC: 8.2 10*3/uL (ref 4.0–10.5)

## 2012-12-17 LAB — POCT I-STAT, CHEM 8
BUN: 17 mg/dL (ref 6–23)
Calcium, Ion: 1.09 mmol/L — ABNORMAL LOW (ref 1.13–1.30)
Chloride: 106 mEq/L (ref 96–112)
Creatinine, Ser: 1.2 mg/dL — ABNORMAL HIGH (ref 0.50–1.10)
HCT: 42 % (ref 36.0–46.0)
Potassium: 3.4 mEq/L — ABNORMAL LOW (ref 3.5–5.1)
Sodium: 139 mEq/L (ref 135–145)

## 2012-12-17 LAB — BASIC METABOLIC PANEL
BUN: 15 mg/dL (ref 6–23)
CO2: 16 mEq/L — ABNORMAL LOW (ref 19–32)
Calcium: 10.6 mg/dL — ABNORMAL HIGH (ref 8.4–10.5)
Glucose, Bld: 97 mg/dL (ref 70–99)
Potassium: 3.5 mEq/L (ref 3.5–5.1)
Sodium: 137 mEq/L (ref 135–145)

## 2012-12-17 LAB — TROPONIN I: Troponin I: <0.01 ng/mL (ref <0.06)

## 2012-12-17 LAB — D-DIMER, QUANTITATIVE: D-Dimer, Quant: 1.39 ug/mL-FEU — ABNORMAL HIGH (ref 0.00–0.48)

## 2012-12-17 MED ORDER — IOHEXOL 350 MG/ML SOLN
100.0000 mL | Freq: Once | INTRAVENOUS | Status: AC | PRN
  Administered 2012-12-17: 100 mL via INTRAVENOUS

## 2012-12-17 NOTE — Progress Notes (Signed)
Subjective:     Toni [name] is a 67 y.o. female who presents for evaluation of dizziness and weakness.  The symptoms started several months ago and are worse. The attacks occur occasionally and last 2 hours. Positions that worsen symptoms: any motion. Previous workup/treatments: none. Associated ear symptoms: none. Associated CNS symptoms: drowsiness and sob with fatigue. Recent infections: none. Head trauma: denied. Drug ingestion: none. Noise exposure: no occupational exposure. Family history: non-contributory.  The following portions of the patient's history were reviewed and updated as appropriate: allergies, current medications, past family history, past medical history, past social history, past surgical history and problem list.  Past Medical History  Diagnosis Date  . COPD (chronic obstructive pulmonary disease)   . Depression   . Diverticulitis   . Hyperlipidemia   . Osteoarthritis   . Hypertension   . Bipolar disorder    History   Social History  . Marital Status: Single    Spouse Name: N/A    Number of Children: N/A  . Years of Education: N/A   Occupational History  . Not on file.   Social History Main Topics  . Smoking status: Former Smoker    Quit date: 06/18/2011  . Smokeless tobacco: Never Used     Comment: marijuana  . Alcohol Use: 1.0 oz/week    2 drink(s) per week  . Drug Use: 14.00 per week    Special: Marijuana  . Sexual Activity: Not Currently    Partners: Male   Other Topics Concern  . Not on file   Social History Narrative  . No narrative on file   Past Surgical History  Procedure Laterality Date  . Tonsillectomy and adenoidectomy    . Total hip arthroplasty      1979 and 1986  . Femur fracture surgery  09/2009  . Joint replacement  09/2009    R hip,  redone, 06/14/2010 [place],---  done secondary to fractured fractured   Family History  Problem Relation Age of Onset  . Stroke Mother   . Arthritis      mother family  . Depression       mother fanily  . Alcohol abuse      Both sides  . Cancer      mothers side  . Colon cancer Neg Hx    Current outpatient prescriptions:ALPRAZolam (XANAX) 1 MG tablet, TAKE 1/2 TABLET BY MOUTH THREE TIMES DAILY AS NEEDED AS DIRECTED, Disp: 30 tablet, Rfl: 0;  amitriptyline (ELAVIL) 25 MG tablet, TAKE 1 TO 2 TABLETS BY MOUTH EVERY NIGHT AT BEDTIME AS NEEDED, Disp: 60 tablet, Rfl: 2;  B-COMPLEX CAPS, Take 1 capsule by mouth daily., Disp: , Rfl: ;  Biotin 1000 MCG tablet, Take 1,000 mcg by mouth daily., Disp: , Rfl:  Calcium Carbonate-Vit D-Min 1200-1000 MG-UNIT CHEW, Chew 1 capsule by mouth daily., Disp: , Rfl: ;  cholecalciferol (VITAMIN D) 1000 UNITS tablet, Take 1,000 Units by mouth 3 (three) times daily., Disp: , Rfl: ;  cyanocobalamin 1000 MCG tablet, Take 100 mcg by mouth daily., Disp: , Rfl: ;  DULoxetine (CYMBALTA) 60 MG capsule, TAKE 1 CAPSULE BY MOUTH TWICE DAILY, Disp: 60 capsule, Rfl: 2 gabapentin (NEURONTIN) 600 MG tablet, TAKE 1 TABLET BY MOUTH THREE TIMES DAILY, Disp: 90 tablet, Rfl: 2;  ibuprofen (ADVIL,MOTRIN) 600 MG tablet, Take 600 mg by mouth every 6 (six) hours as needed., Disp: , Rfl: ;  lamoTRIgine (LAMICTAL) 100 MG tablet, TAKE 1 TABLET BY MOUTH TWICE DAILY, Disp: 60 tablet, Rfl:  2;  Magnesium 500 MG TABS, Take 1 tablet by mouth daily., Disp: , Rfl: ;  Melatonin 5 MG CAPS, Take by mouth daily., Disp: , Rfl:  MOVIPREP 100 G SOLR, moviprep as directed. No substitutions, Disp: 1 kit, Rfl: 0;  Multiple Vitamin (MULTIVITAMIN) tablet, Take 1 tablet by mouth daily., Disp: , Rfl: ;  Omega-3 Fatty Acids (FISH OIL) 1200 MG CAPS, Take by mouth., Disp: , Rfl: ;  Selenium 200 MCG TABS, Take 1 tablet by mouth daily., Disp: , Rfl: ;  simvastatin (ZOCOR) 20 MG tablet, 1 tab by mouth daily--repeat labs are due now, Disp: 30 tablet, Rfl: 0 tiZANidine (ZANAFLEX) 4 MG tablet, TAKE 1-2 TABLETS BY MOUTH EVERY 6 HOURS AS NEEDED, Disp: 30 tablet, Rfl: 0   Review of Systems Pertinent items are noted  in HPI.    Objective:    BP 120/70  Pulse 96  Temp(Src) 97.7 F (36.5 C) (Oral)  Wt 166 lb (75.297 kg)  SpO2 99% General appearance: alert, cooperative, appears stated age and no distress Head: Normocephalic, without obvious abnormality, atraumatic Eyes: conjunctivae/corneas clear. PERRL, EOM's intact. Fundi benign. Ears: normal TM's and external ear canals both ears and normal TM&#39;s and external ear canals both ears Nose: Nares normal. Septum midline. Mucosa normal. No drainage or sinus tenderness. Throat: lips, mucosa, and tongue normal; teeth and gums normal Neck: no adenopathy, no carotid bruit, no JVD, supple, symmetrical, trachea midline and thyroid not enlarged, symmetric, no tenderness/mass/nodules Lungs: clear to auscultation bilaterally Heart: S1, S2 normal Abdomen: soft, non-tender; bowel sounds normal; no masses,  no organomegaly Extremities: extremities normal, atraumatic, no cyanosis or edema Neurologic: Alert and oriented X 3, normal strength and tone. Normal symmetric reflexes. Normal coordination and gait      Assessment:    Vertigo --- with sob and palpitations   Plan:    cardio appointment pending Check labs and cxr If symptoms worsen , go to ER rto prn

## 2012-12-17 NOTE — ED Notes (Signed)
PA at bedside.

## 2012-12-17 NOTE — Patient Instructions (Signed)

## 2012-12-17 NOTE — Progress Notes (Signed)
Pre visit review using our clinic review tool, if applicable. No additional management support is needed unless otherwise documented below in the visit note. 

## 2012-12-17 NOTE — ED Provider Notes (Signed)
CSN: [id number]     Arrival date & time 12/17/12  2154 History   First MD Initiated Contact with Patient 12/17/12 2218     Chief Complaint  Patient presents with  . Shortness of Breath   (Consider location/radiation/quality/duration/timing/severity/associated sxs/prior Treatment) HPI Comments: Patient presents today with a chief complaint of shortness of breath.  She reports that she has been feeling short of breath for the past couple of months.  She states that nothing was different today.  She also reports that she has had intermittent chest pain over the past couple of months, but denies any chest pain at this time.  She denies cough, fever, or chills. She was seen by her PCP earlier today for the shortness of breath.  Her PCP ordered a CXR, which was negative.  She also ordered a d-dimer, which was 1.39.  Patient was then called and told to come to the ED to rule out a Pulmonary Embolism.  Patient reports that she has been depressed and has been lying in bed more over the past couple of months.  She denies any prolonged travel or surgeries in the past 4 weeks.  Denies lower extremity edema.  Denies hormone replacement therapy.  Denies prior history of DVT or PE.  Denies any prior history of cardiac disease.  The history is provided by the patient.    Past Medical History  Diagnosis Date  . COPD (chronic obstructive pulmonary disease)   . Depression   . Diverticulitis   . Hyperlipidemia   . Osteoarthritis   . Hypertension   . Bipolar disorder    Past Surgical History  Procedure Laterality Date  . Tonsillectomy and adenoidectomy    . Total hip arthroplasty      1979 and 1986  . Femur fracture surgery  09/2009  . Joint replacement  09/2009    R hip,  redone, 06/14/2010 [place],---  done secondary to fractured fractured   Family History  Problem Relation Age of Onset  . Stroke Mother   . Arthritis      mother family  . Depression      mother fanily  . Alcohol abuse     Both sides  . Cancer      mothers side  . Colon cancer Neg Hx    History  Substance Use Topics  . Smoking status: Former Smoker    Quit date: 06/18/2011  . Smokeless tobacco: Never Used     Comment: marijuana  . Alcohol Use: 1.0 oz/week    2 drink(s) per week   OB History   Grav Para Term Preterm Abortions TAB SAB Ect Mult Living                 Review of Systems  Constitutional: Negative for fever and chills.  Respiratory: Positive for shortness of breath. Negative for cough and wheezing.   All other systems reviewed and are negative.    Allergies  Levofloxacin  Home Medications   Current Outpatient Rx  Name  Route  Sig  Dispense  Refill  . ALPRAZolam (XANAX) 1 MG tablet      TAKE 1/2 TABLET BY MOUTH THREE TIMES DAILY AS NEEDED AS DIRECTED   30 tablet   0   . amitriptyline (ELAVIL) 25 MG tablet      TAKE 1 TO 2 TABLETS BY MOUTH EVERY NIGHT AT BEDTIME AS NEEDED   60 tablet   2   . B-COMPLEX CAPS   Oral   Take  1 capsule by mouth daily.         . Biotin 1000 MCG tablet   Oral   Take 1,000 mcg by mouth daily.         . Calcium Carbonate-Vit D-Min 1200-1000 MG-UNIT CHEW   Oral   Chew 1 capsule by mouth daily.         . cholecalciferol (VITAMIN D) 1000 UNITS tablet   Oral   Take 1,000 Units by mouth 3 (three) times daily.         . cyanocobalamin 1000 MCG tablet   Oral   Take 100 mcg by mouth daily.         . DULoxetine (CYMBALTA) 60 MG capsule      TAKE 1 CAPSULE BY MOUTH TWICE DAILY   60 capsule   2   . gabapentin (NEURONTIN) 600 MG tablet      TAKE 1 TABLET BY MOUTH THREE TIMES DAILY   90 tablet   2   . ibuprofen (ADVIL,MOTRIN) 600 MG tablet   Oral   Take 600 mg by mouth every 6 (six) hours as needed.         . lamoTRIgine (LAMICTAL) 100 MG tablet      TAKE 1 TABLET BY MOUTH TWICE DAILY   60 tablet   2   . Magnesium 500 MG TABS   Oral   Take 1 tablet by mouth daily.         . Melatonin 5 MG CAPS   Oral   Take by  mouth daily.         Marland Kitchen MOVIPREP 100 G SOLR      moviprep as directed. No substitutions   1 kit   0     Dispense as written.   . Multiple Vitamin (MULTIVITAMIN) tablet   Oral   Take 1 tablet by mouth daily.         . Omega-3 Fatty Acids (FISH OIL) 1200 MG CAPS   Oral   Take by mouth.         . Selenium 200 MCG TABS   Oral   Take 1 tablet by mouth daily.         . simvastatin (ZOCOR) 20 MG tablet      1 tab by mouth daily--repeat labs are due now   30 tablet   0   . tiZANidine (ZANAFLEX) 4 MG tablet      TAKE 1-2 TABLETS BY MOUTH EVERY 6 HOURS AS NEEDED   30 tablet   0    BP 177/68  Pulse 82  Temp(Src) 98.9 F (37.2 C) (Oral)  Resp 16  Ht 5\' 6"  (1.676 m)  Wt 166 lb (75.297 kg)  BMI 26.81 kg/m2  SpO2 99% Physical Exam  Nursing note and vitals reviewed. Constitutional: She appears well-developed and well-nourished.  HENT:  Head: Normocephalic and atraumatic.  Mouth/Throat: Oropharynx is clear and moist.  Neck: Normal range of motion. Neck supple.  Cardiovascular: Normal rate, regular rhythm and normal heart sounds.   Pulses:      Dorsalis pedis pulses are 2+ on the right side, and 2+ on the left side.  Pulmonary/Chest: Effort normal and breath sounds normal. No respiratory distress. She has no wheezes. She has no rales.  Abdominal: Soft. Bowel sounds are normal. She exhibits no distension and no mass. There is no tenderness. There is no rebound and no guarding.  Musculoskeletal: Normal range of motion.  No LE edema bilaterally Negative Homan's sign  bilaterally  Neurological: She is alert.  Skin: Skin is warm and dry.  Psychiatric: She has a normal mood and affect.    ED Course  Procedures (including critical care time) Labs Review Labs Reviewed  CBC WITH DIFFERENTIAL  BASIC METABOLIC PANEL   Imaging Review Dg Chest 2 View  12/17/2012   CLINICAL DATA:  Shortness of breath.  EXAM: CHEST  2 VIEW  COMPARISON:  09/30/2009.  FINDINGS: The heart  size and mediastinal contours are within normal limits. Both lungs are clear. The visualized skeletal structures are unremarkable.  IMPRESSION: No active cardiopulmonary disease.   Electronically Signed   By: Elige Ko   On: 12/17/2012 16:42   Ct Angio Chest Pe W/cm &/or Wo Cm  12/18/2012   CLINICAL DATA:  Rule out pulmonary embolism. Shortness of breath and generalized chest pain.  EXAM: CT ANGIOGRAPHY CHEST WITH CONTRAST  TECHNIQUE: Multidetector CT imaging of the chest was performed using the standard protocol during bolus administration of intravenous contrast. Multiplanar CT image reconstructions including MIPs were obtained to evaluate the vascular anatomy.  CONTRAST:  OMNIPAQUE IOHEXOL 350 MG/ML SOLN  COMPARISON:  None.  FINDINGS: THORACIC INLET/BODY WALL:  No acute abnormality.  MEDIASTINUM:  Normal heart size. No pericardial effusion. No acute vascular abnormality. No adenopathy.  LUNG WINDOWS:  No consolidation.  No effusion.  No suspicious pulmonary nodule.  UPPER ABDOMEN:  On the lowest image there is a low-attenuation structure within the central liver measuring 11 mm. The appearance is nonspecific. Notably there is no indication in the EMR of chronic liver disease.  OSSEOUS:  Mineralization around both humeral heads, location favoring chondrocalcinosis.  Review of the MIP images confirms the above findings.  IMPRESSION: No evidence of pulmonary embolism or other acute intrathoracic disease.   Electronically Signed   By: Tiburcio Pea M.D.   On: 12/18/2012 00:05    EKG Interpretation   None       Date: 12/18/2012  Rate: 88  Rhythm: normal sinus rhythm  QRS Axis: normal  Intervals: normal  ST/T Wave abnormalities: nonspecific T wave changes  Conduction Disutrbances:none  Narrative Interpretation:   Old EKG Reviewed: unchanged      MDM  No diagnosis found. Patient presents with a chief complaint of SOB which has been present for the past several months.  She denies  chest pain.  Lungs CTAB.  Pulse ox 100 on RA.  CT angio negative for PE.  Labs unremarkable.  Troponin negative.  Patient stable for discharge.  Return precautions given.    Santiago Glad, PA-C 12/18/12 [phone]

## 2012-12-17 NOTE — ED Notes (Signed)
Pt went to her primary Dr today for shortness of breath, Dr called her this evening and said that her d dimer was elevated and to come to ED for further evaluation of a PE

## 2012-12-17 NOTE — ED Notes (Signed)
Patient ambulated to bathroom without difficulty  No SOB/DOE during or after ambulation

## 2012-12-17 NOTE — ED Notes (Signed)
Patient to CT.

## 2012-12-17 NOTE — ED Notes (Signed)
Patient with c/o SOB x 3 months Per patient, her PCP called her tonight and informed patient that her D-Dimer was elevated  Patient does report intermittent CP to mid/right side of chest--unknown precipitating or alleviating factors Patient also reports "lightheadedness" x 3 months, for which she is supposed to have an MRI tomorrow Patient placed on cardiac monitor and noted to be in SR LCTA with O2 sat 97 % on RA Patient appears somewhat anxious during assessment, but in NAD Patient admits to this nurse "I've also had about three glasses of wine tonight."

## 2012-12-17 NOTE — ED Notes (Signed)
Patient back from CT Patient in NAD upon return to room

## 2012-12-18 ENCOUNTER — Telehealth: Payer: Self-pay | Admitting: *Deleted

## 2012-12-18 LAB — BASIC METABOLIC PANEL
BUN: 17 mg/dL (ref 6–23)
Calcium: 10.2 mg/dL (ref 8.4–10.5)
Chloride: 101 mEq/L (ref 96–112)
Creatinine, Ser: 0.9 mg/dL (ref 0.4–1.2)
GFR: 66.46 mL/min (ref >60.00)

## 2012-12-18 LAB — CBC WITH DIFFERENTIAL/PLATELET
Basophils Absolute: 0 10*3/uL (ref 0.0–0.1)
Eosinophils Relative: 1.6 % (ref 0.0–5.0)
HCT: 38.2 % (ref 36.0–46.0)
Monocytes Absolute: 0.4 10*3/uL (ref 0.1–1.0)
Monocytes Relative: 6.4 % (ref 3.0–12.0)
Neutrophils Relative %: 55.6 % (ref 43.0–77.0)
Platelets: 323 10*3/uL (ref 150.0–400.0)
RBC: 4.19 Mil/uL (ref 3.87–5.11)
WBC: 5.6 10*3/uL (ref 4.5–10.5)

## 2012-12-18 LAB — POCT URINALYSIS DIPSTICK
Bilirubin, UA: NEGATIVE
Glucose, UA: NEGATIVE
Nitrite, UA: NEGATIVE
Spec Grav, UA: >=1.03
Urobilinogen, UA: 0.2

## 2012-12-18 LAB — HEPATIC FUNCTION PANEL
ALT: 17 U/L (ref 0–35)
AST: 22 U/L (ref 0–37)
Albumin: 4.5 g/dL (ref 3.5–5.2)
Bilirubin, Direct: 0.1 mg/dL (ref 0.0–0.3)
Total Bilirubin: 1 mg/dL (ref 0.3–1.2)

## 2012-12-18 LAB — TSH: TSH: 1.22 u[IU]/mL (ref 0.35–5.50)

## 2012-12-18 LAB — VITAMIN B12: Vitamin B-12: 410 pg/mL (ref 211–911)

## 2012-12-18 NOTE — ED Provider Notes (Deleted)
Medical screening examination/treatment/procedure(s) were performed by non-physician practitioner and as supervising physician I was immediately available for consultation/collaboration.  Osinachi Navarrette L Reg Bircher, MD 12/18/12 1520 

## 2012-12-18 NOTE — ED Notes (Signed)
Dr. Effie Shy at bedside Will obtain ambulatory pulse ox when MD finished speaking with patient

## 2012-12-18 NOTE — ED Notes (Signed)
Ambulatory pulse ox on room air 96%

## 2012-12-18 NOTE — Telephone Encounter (Signed)
[facility] Triage Call Report Triage Record Num: [id number] Operator: Baldomero Lamy Patient Name: Toni [name] Call Date & Time: 12/17/2012 7:03:24PM Patient Phone: [phone] PCP: Lelon Perla Patient Gender: Female PCP Fax : [phone] Patient DOB: 05-24-1945 Practice Name: [facility] Reason for Call: Caller: [name]; PCP: Lelon Perla.; CB#: [phone]. Pt # [phone]. Reporting stat D-dimer and Troponin drawn on 12/17/12. D-Dimer 1.39 Hight (0-0.48) Triponin <0.01 -normal (anything less than 0.06) No prior hx on either lab for pt. Reviewed Epic for office f/u; none. Reported ot Dr. Cato Mulligan per standing order. He will send Dr. Laury Axon a message to f/u with pt on 12/18/12. No further questions. Protocol(s) Used: PCP Calls, No Triage (Adult) Recommended Outcome per Protocol: Call Provider within 24 Hours Reason for Outcome: Lab calling with test results

## 2012-12-18 NOTE — ED Provider Notes (Signed)
  Face-to-face evaluation   History: Ongoing shortness of breath for several months. Evaluated today by PCP.  Physical exam: Alert, calm, cooperative. No respiratory distress on room air, with normal oxygen saturation. Lungs clear to auscultation  Medical screening examination/treatment/procedure(s) were conducted as a shared visit with non-physician practitioner(s) and myself.  I personally evaluated the patient during the encounter  Flint Melter, MD 12/20/12 [phone]

## 2012-12-22 ENCOUNTER — Ambulatory Visit (HOSPITAL_BASED_OUTPATIENT_CLINIC_OR_DEPARTMENT_OTHER): Payer: Medicare Other

## 2012-12-26 ENCOUNTER — Ambulatory Visit (HOSPITAL_BASED_OUTPATIENT_CLINIC_OR_DEPARTMENT_OTHER): Payer: Medicare Other

## 2013-01-02 ENCOUNTER — Ambulatory Visit (HOSPITAL_BASED_OUTPATIENT_CLINIC_OR_DEPARTMENT_OTHER): Payer: Medicare Other | Attending: Family Medicine

## 2013-01-11 ENCOUNTER — Other Ambulatory Visit: Payer: Self-pay | Admitting: Family Medicine

## 2013-01-11 ENCOUNTER — Ambulatory Visit (HOSPITAL_COMMUNITY): Payer: Medicare Other | Attending: Interventional Cardiology | Admitting: Cardiology

## 2013-01-11 ENCOUNTER — Ambulatory Visit (INDEPENDENT_AMBULATORY_CARE_PROVIDER_SITE_OTHER): Payer: Medicare Other | Admitting: Interventional Cardiology

## 2013-01-11 ENCOUNTER — Encounter: Payer: Self-pay | Admitting: Interventional Cardiology

## 2013-01-11 VITALS — BP 150/88 | HR 101 | Ht 65.0 in | Wt 159.0 lb

## 2013-01-11 DIAGNOSIS — I10 Essential (primary) hypertension: Secondary | ICD-10-CM | POA: Insufficient documentation

## 2013-01-11 DIAGNOSIS — R0600 Dyspnea, unspecified: Secondary | ICD-10-CM

## 2013-01-11 DIAGNOSIS — R0789 Other chest pain: Secondary | ICD-10-CM | POA: Diagnosis not present

## 2013-01-11 DIAGNOSIS — R0609 Other forms of dyspnea: Secondary | ICD-10-CM | POA: Diagnosis not present

## 2013-01-11 DIAGNOSIS — E785 Hyperlipidemia, unspecified: Secondary | ICD-10-CM

## 2013-01-11 DIAGNOSIS — J449 Chronic obstructive pulmonary disease, unspecified: Secondary | ICD-10-CM | POA: Diagnosis not present

## 2013-01-11 DIAGNOSIS — J4489 Other specified chronic obstructive pulmonary disease: Secondary | ICD-10-CM | POA: Insufficient documentation

## 2013-01-11 DIAGNOSIS — R0989 Other specified symptoms and signs involving the circulatory and respiratory systems: Secondary | ICD-10-CM | POA: Insufficient documentation

## 2013-01-11 NOTE — Patient Instructions (Signed)
Your physician recommends that you continue on your current medications as directed. Please refer to the Current Medication list given to you today.  Lab today: Bnp  Your physician has requested that you have an echocardiogram. Echocardiography is a painless test that uses sound waves to create images of your heart. It provides your doctor with information about the size and shape of your heart and how well your heart's chambers and valves are working. This procedure takes approximately one hour. There are no restrictions for this procedure.  Your physician recommends that you schedule a follow-up appointment as needed pending results

## 2013-01-11 NOTE — Progress Notes (Addendum)
Patient ID: Toni [name], female   DOB: 08/09/1945, 67 y.o.   MRN: [id number]   Date: 01/11/2013 ID: Toni [name], DOB 10/10/1945, MRN [id number] PCP: Loreen Freud, DO  Reason: Dyspnea  ASSESSMENT;  1. Dyspnea on exertion. Likely represents physical deconditioning. Rule out left heart failure; anemia and pulmonary emboli have been excluded. 2. Chest pain, highly atypical 3. History of depression and anxiety 4. Fibromyalgia 5. History of recurring nausea, diaphoresis, and weakness. Recurring episodes, possibly neurocardiogenic in origin.  PLAN:  1. BNP 2. 2-D Doppler echocardiogram 3. His BNP and 2-D Doppler echocardiogram or unremarkable, I don't believe further evaluation will be necessary.   SUBJECTIVE: Toni [name] is a 67 y.o. female who is referred for evaluation of dyspnea and chest discomfort. She has vast complaints from nearly every organ system. The dyspnea is that of feeling breathless with minimal activity such as getting up and walking to her telephone which may be only feet away. Chest discomfort is poorly characterized and occurs spontaneously. The dyspnea has led to several chest x-rays and a CT scan of the chest (November 20th 2014). No evidence of coronary calcification was mentioned. She has been physically inactive do to fibromyalgia and depression. Her physical activity has been severely curtailed because of this. She denies edema. There is no orthopnea. She denies palpitations/tachycardia.   Allergies  Allergen Reactions  . Levofloxacin     REACTION: RASH/REDNESS    Current Outpatient Prescriptions on File Prior to Visit  Medication Sig Dispense Refill  . ALPRAZolam (XANAX) 1 MG tablet TAKE 1/2 TABLET BY MOUTH THREE TIMES DAILY AS NEEDED AS DIRECTED  30 tablet  0  . amitriptyline (ELAVIL) 25 MG tablet TAKE 1 TO 2 TABLETS BY MOUTH EVERY NIGHT AT BEDTIME AS NEEDED  60 tablet  2  . B-COMPLEX CAPS Take 1 capsule by mouth daily.      . Biotin 1000 MCG tablet  Take 1,000 mcg by mouth daily.      . Calcium Carbonate-Vit D-Min 1200-1000 MG-UNIT CHEW Chew 1 capsule by mouth daily.      . cholecalciferol (VITAMIN D) 1000 UNITS tablet Take 1,000 Units by mouth 3 (three) times daily.      . cyanocobalamin 1000 MCG tablet Take 100 mcg by mouth daily.      Marland Kitchen gabapentin (NEURONTIN) 600 MG tablet TAKE 1 TABLET BY MOUTH THREE TIMES DAILY  90 tablet  2  . ibuprofen (ADVIL,MOTRIN) 600 MG tablet Take 600 mg by mouth every 6 (six) hours as needed.      . Multiple Vitamin (MULTIVITAMIN) tablet Take 1 tablet by mouth daily.      . Omega-3 Fatty Acids (FISH OIL) 1200 MG CAPS Take 1,200 capsules by mouth daily.       . Selenium 200 MCG TABS Take 1 tablet by mouth daily.      Marland Kitchen tiZANidine (ZANAFLEX) 4 MG tablet TAKE 1-2 TABLETS BY MOUTH EVERY 6 HOURS AS NEEDED  30 tablet  0   No current facility-administered medications on file prior to visit.    Past Medical History  Diagnosis Date  . COPD (chronic obstructive pulmonary disease)   . Depression   . Diverticulitis   . Hyperlipidemia   . Osteoarthritis   . Hypertension   . Bipolar disorder     Past Surgical History  Procedure Laterality Date  . Tonsillectomy and adenoidectomy    . Total hip arthroplasty      1979 and 1986  . Femur fracture surgery  09/2009  .  Joint replacement  09/2009    R hip,  redone, 06/14/2010 [place],---  done secondary to fractured fractured    History   Social History  . Marital Status: Single    Spouse Name: N/A    Number of Children: N/A  . Years of Education: N/A   Occupational History  . Not on file.   Social History Main Topics  . Smoking status: Former Smoker    Quit date: 06/18/2011  . Smokeless tobacco: Never Used     Comment: marijuana  . Alcohol Use: 1.0 oz/week    2 drink(s) per week  . Drug Use: 14.00 per week    Special: Marijuana     Comment: regular use  . Sexual Activity: Not Currently    Partners: Male   Other Topics Concern  . Not on file    Social History Narrative  . No narrative on file    Family History  Problem Relation Age of Onset  . Stroke Mother   . Arthritis      mother family  . Depression      mother fanily  . Alcohol abuse      Both sides  . Cancer      mothers side  . Colon cancer Neg Hx     ROS: Denies alcohol. Weight is been stable. She is with depression. She complains of diffuse muscle aches and pains related to fibromyalgia. She has had abdominal surgeries and diverticulitis.Marland Kitchen Episodes of nausea, diaphoresis and vomiting have been occurring recently. A source has not been identified. Other systems negative for complaints.  OBJECTIVE: BP 150/88  Pulse 101  Ht 5\' 5"  (1.651 m)  Wt 159 lb (72.122 kg)  BMI 26.46 kg/m2,  General: No acute distress, appearing over than her stated age HEENT: normal no pallor or jaundice Neck: JVD flat. Carotids absent Chest: Clear Cardiac: Murmur: 1 of 6 systolic murmur right upper sternal border. Gallop: S4. Rhythm: Regular. Other: Normal Abdomen: Bruit: Absent. Pulsation: 2+ Extremities: Edema: No edema. Pulses: 2+ Neuro: Normal Psych: Anxious  ECG: Sinus tachycardia 101 beats per minute otherwise unremarkable.

## 2013-01-11 NOTE — Progress Notes (Signed)
Echo performed. 

## 2013-01-12 ENCOUNTER — Other Ambulatory Visit: Payer: Self-pay | Admitting: Family Medicine

## 2013-01-12 ENCOUNTER — Ambulatory Visit (INDEPENDENT_AMBULATORY_CARE_PROVIDER_SITE_OTHER): Payer: Medicare Other

## 2013-01-12 ENCOUNTER — Telehealth: Payer: Self-pay

## 2013-01-12 DIAGNOSIS — R42 Dizziness and giddiness: Secondary | ICD-10-CM | POA: Diagnosis not present

## 2013-01-12 DIAGNOSIS — R5381 Other malaise: Secondary | ICD-10-CM

## 2013-01-12 DIAGNOSIS — R0602 Shortness of breath: Secondary | ICD-10-CM

## 2013-01-12 DIAGNOSIS — R002 Palpitations: Secondary | ICD-10-CM

## 2013-01-12 NOTE — Telephone Encounter (Signed)
lmom.  Echo is normal

## 2013-01-12 NOTE — Telephone Encounter (Signed)
Message copied by Jarvis Newcomer on Tue Jan 12, 2013  1:38 PM ------      Message from: Verdis Prime      Created: Mon Jan 11, 2013  7:26 PM       Echo is normal ------

## 2013-01-12 NOTE — Telephone Encounter (Signed)
Last seen 12/17/12 and filled 11/27/12 #30. Please advise      KP

## 2013-01-13 ENCOUNTER — Telehealth: Payer: Self-pay

## 2013-01-13 NOTE — Telephone Encounter (Signed)
pt given lab results.  Blood tests suggested shortness of breath is not related to heart .pt verbalized understanding.

## 2013-01-13 NOTE — Telephone Encounter (Signed)
Message copied by Jarvis Newcomer on Wed Jan 13, 2013  3:59 PM ------      Message from: Verdis Prime      Created: Tue Jan 12, 2013  5:36 PM       Blood tests suggested shortness of breath is not related to heart ------

## 2013-01-15 ENCOUNTER — Telehealth: Payer: Self-pay | Admitting: *Deleted

## 2013-01-15 NOTE — Progress Notes (Signed)
Spoke with pt and made aware of results.  

## 2013-01-15 NOTE — Telephone Encounter (Signed)
Spoke with pt and made aware that MRI was negative.

## 2013-01-27 DIAGNOSIS — Z23 Encounter for immunization: Secondary | ICD-10-CM | POA: Diagnosis not present

## 2013-02-05 ENCOUNTER — Other Ambulatory Visit: Payer: Self-pay | Admitting: Family Medicine

## 2013-02-17 ENCOUNTER — Encounter: Payer: Self-pay | Admitting: Gastroenterology

## 2013-03-01 ENCOUNTER — Other Ambulatory Visit: Payer: Self-pay | Admitting: Family Medicine

## 2013-03-01 NOTE — Telephone Encounter (Signed)
Last seen 12/17/12 and filled 01/12/13 #30.  UDS 05/11/12-- low risk  Please advise     KP

## 2013-03-31 ENCOUNTER — Other Ambulatory Visit: Payer: Self-pay | Admitting: Family Medicine

## 2013-04-02 NOTE — Telephone Encounter (Signed)
Last seen 12/17/12 and filled Xanax 03/01/13 #30 Zanaflex 01/12/13 #30. Please advise      KP

## 2013-04-09 ENCOUNTER — Encounter: Payer: Medicare Other | Admitting: Gastroenterology

## 2013-04-25 ENCOUNTER — Other Ambulatory Visit: Payer: Self-pay | Admitting: Family Medicine

## 2013-04-26 NOTE — Telephone Encounter (Signed)
Last seen 12/17/12 and filled 03/01/13 #30. UDS 05/11/12 Low risk; however Neg for Xanax.   Please advise      KP

## 2013-05-04 ENCOUNTER — Other Ambulatory Visit: Payer: Self-pay | Admitting: Family Medicine

## 2013-05-05 ENCOUNTER — Telehealth: Payer: Self-pay | Admitting: Family Medicine

## 2013-05-05 MED ORDER — IBUPROFEN 600 MG PO TABS: 600.0000 mg | ORAL_TABLET | Freq: Four times a day (QID) | ORAL | Status: DC | PRN

## 2013-05-05 NOTE — Telephone Encounter (Signed)
Called pharmacy and they have the Rx.      KP

## 2013-05-05 NOTE — Telephone Encounter (Signed)
1 po q6h prn #60

## 2013-05-05 NOTE — Telephone Encounter (Signed)
Patient called to request a refill for Ibuprofen 600 mg. Patient states that she takes them for her back pain and they are cheaper than OTC. Please advise Pharmacy [id number] - [place], [place] - [address] AT [place]

## 2013-05-05 NOTE — Telephone Encounter (Signed)
MSG left advising patient the medication was sent.      KP

## 2013-05-05 NOTE — Telephone Encounter (Signed)
Rx has not been called in from our office. Is it ok to send to the pharmacy?     KP

## 2013-05-18 DIAGNOSIS — IMO0002 Reserved for concepts with insufficient information to code with codable children: Secondary | ICD-10-CM | POA: Diagnosis not present

## 2013-07-01 ENCOUNTER — Other Ambulatory Visit: Payer: Self-pay | Admitting: Family Medicine

## 2013-07-01 DIAGNOSIS — IMO0002 Reserved for concepts with insufficient information to code with codable children: Secondary | ICD-10-CM | POA: Diagnosis not present

## 2013-07-01 NOTE — Telephone Encounter (Signed)
Last seen 12/17/12 and filled 04/26/13 #30. Please advise     KP

## 2013-07-14 ENCOUNTER — Telehealth: Payer: Self-pay | Admitting: Family Medicine

## 2013-07-14 NOTE — Telephone Encounter (Addendum)
C/o: Hemorrhoids that has pushed through.  Started this morning.  Painful.  Itching.  No bleeding.  Has periodic episodes of constipation.    Advice: She was advised to try Preparation H, Miralax (which she already has at home), drink plenty of water, increase fiber in food, and warm sitz bath daily.  If no relief or symptoms worsen, call office to schedule an appointment.

## 2013-07-14 NOTE — Telephone Encounter (Signed)
Caller name: Suhey  Call back number:[phone]   Reason for call:   Pt has a sudden appearance of hemorrhoids and wants some advice.  no bleeding as of yet.  Just needs advice.  Does not want a referral to GI.

## 2013-07-17 ENCOUNTER — Ambulatory Visit: Payer: Self-pay | Admitting: Family Medicine

## 2013-07-22 ENCOUNTER — Ambulatory Visit (INDEPENDENT_AMBULATORY_CARE_PROVIDER_SITE_OTHER): Payer: Medicare Other | Admitting: Family Medicine

## 2013-07-22 ENCOUNTER — Encounter: Payer: Self-pay | Admitting: Family Medicine

## 2013-07-22 VITALS — BP 130/82 | HR 108 | Temp 98.8°F | Wt 147.0 lb

## 2013-07-22 DIAGNOSIS — S0500XA Injury of conjunctiva and corneal abrasion without foreign body, unspecified eye, initial encounter: Secondary | ICD-10-CM

## 2013-07-22 DIAGNOSIS — R209 Unspecified disturbances of skin sensation: Secondary | ICD-10-CM

## 2013-07-22 DIAGNOSIS — R200 Anesthesia of skin: Secondary | ICD-10-CM

## 2013-07-22 DIAGNOSIS — K649 Unspecified hemorrhoids: Secondary | ICD-10-CM | POA: Diagnosis not present

## 2013-07-22 DIAGNOSIS — S058X9A Other injuries of unspecified eye and orbit, initial encounter: Secondary | ICD-10-CM | POA: Diagnosis not present

## 2013-07-22 MED ORDER — SULFACETAMIDE-PREDNISOLONE 10-0.2 % OP OINT: 1.0000 "application " | TOPICAL_OINTMENT | Freq: Four times a day (QID) | OPHTHALMIC | Status: DC

## 2013-07-22 MED ORDER — STARCH 51 % RE SUPP: 1.0000 | RECTAL | Status: DC | PRN

## 2013-07-22 NOTE — Progress Notes (Signed)
  Subjective:    Toni [name] is a 68 y.o. female who presents for evaluation of discharge, erythema, foreign body sensation, itching and tearing in both eyes L >R. She has noticed the above symptoms for 2 weeks. Onset was gradual. Patient denies blurred vision, pain and photophobia. There is a history of wearing glasses.  Pt also c/o numbness on bottom of both feet x since surgery 2012 on hip and leg.  Pt does not radiate down leg. She has had R foot numbness for years that has recently gone to L foot as well.  Feels like it is asleep all the time.  The following portions of the patient's history were reviewed and updated as appropriate: allergies, current medications, past family history, past medical history, past social history, past surgical history and problem list.  Review of Systems Pertinent items are noted in HPI.   Objective:    BP 130/82  Pulse 108  Temp(Src) 98.8 F (37.1 C) (Oral)  Wt 147 lb (66.679 kg)  SpO2 95%      General: alert, cooperative, appears stated age and no distress  Eyes:  positive findings: + injected + d/c L>R  Vision: Not performed  Fluorescein:  positive uptake b/l eyes     Assessment:    Acute conjunctivitis and Corneal abrasion   Plan:    Discussed the diagnosis and proper care of conjunctivitis.  Stressed household Nurse, mental health. Ophthalmic drops per orders. Warm compress to eye(s). Local eye care discussed.  If not better by Monday or worsens --call opht

## 2013-07-22 NOTE — Progress Notes (Signed)
Pre visit review using our clinic review tool, if applicable. No additional management support is needed unless otherwise documented below in the visit note. 

## 2013-07-22 NOTE — Patient Instructions (Signed)
Corneal Abrasion °The cornea is the clear covering at the front and center of the eye. When looking at the colored portion of the eye (iris), you are looking through the cornea. This very thin tissue is made up of many layers. The surface layer is a single layer of cells (corneal epithelium) and is one of the most sensitive tissues in the body. If a scratch or injury causes the corneal epithelium to come off, it is called a corneal abrasion. If the injury extends to the tissues below the epithelium, the condition is called a corneal ulcer. °CAUSES  °· Scratches. °· Trauma. °· Foreign body in the eye. °Some people have recurrences of abrasions in the area of the original injury even after it has healed (recurrent erosion syndrome). Recurrent erosion syndrome generally improves and goes away with time. °SYMPTOMS  °· Eye pain. °· Difficulty or inability to keep the injured eye open. °· The eye becomes very sensitive to light. °· Recurrent erosions tend to happen suddenly, first thing in the morning, usually after waking up and opening the eye. °DIAGNOSIS  °Your health care provider can diagnose a corneal abrasion during an eye exam. Dye is usually placed in the eye using a drop or a small paper strip moistened by your tears. When the eye is examined with a special light, the abrasion shows up clearly because of the dye. °TREATMENT  °· Small abrasions may be treated with antibiotic drops or ointment alone. °· A pressure patch may be put over the eye. If this is done, follow your doctor's instructions for when to remove the patch. Do not drive or use machines while the eye patch is on. Judging distances is hard to do with a patch on. °If the abrasion becomes infected and spreads to the deeper tissues of the cornea, a corneal ulcer can result. This is serious because it can cause corneal scarring. Corneal scars interfere with light passing through the cornea and cause a loss of vision in the involved eye. °HOME CARE  INSTRUCTIONS °· Use medicine or ointment as directed. Only take over-the-counter or prescription medicines for pain, discomfort, or fever as directed by your health care provider. °· Do not drive or operate machinery if your eye is patched. Your ability to judge distances is impaired. °· If your health care provider has given you a follow-up appointment, it is very important to keep that appointment. Not keeping the appointment could result in a severe eye infection or permanent loss of vision. If there is any problem keeping the appointment, let your health care provider know. °SEEK MEDICAL CARE IF:  °· You have pain, light sensitivity, and a scratchy feeling in one eye or both eyes. °· Your pressure patch keeps loosening up, and you can blink your eye under the patch after treatment. °· Any kind of discharge develops from the eye after treatment or if the lids stick together in the morning. °· You have the same symptoms in the morning as you did with the original abrasion days, weeks, or months after the abrasion healed. °MAKE SURE YOU:  °· Understand these instructions. °· Will watch your condition. °· Will get help right away if you are not doing well or get worse. °Document Released: 01/12/2000 Document Revised: 01/19/2013 Document Reviewed: 09/21/2012 °ExitCare® Patient Information ©2015 [employer]. This information is not intended to replace advice given to you by your health care provider. Make sure you discuss any questions you have with your health care provider. ° °

## 2013-07-26 ENCOUNTER — Telehealth: Payer: Self-pay | Admitting: Family Medicine

## 2013-07-26 NOTE — Telephone Encounter (Signed)
7-10 days

## 2013-07-26 NOTE — Telephone Encounter (Signed)
Please advise      KP 

## 2013-07-26 NOTE — Telephone Encounter (Signed)
Patient has been made aware and voiced understanding.     KP 

## 2013-07-26 NOTE — Telephone Encounter (Signed)
Caller name: Nimco  Call back number:[phone]   Reason for call:  Pt wants to know how long she should take the eye cream.  Pt states they are starting to feel a little better

## 2013-08-03 ENCOUNTER — Other Ambulatory Visit: Payer: Self-pay | Admitting: Family Medicine

## 2013-08-04 NOTE — Telephone Encounter (Signed)
Requesting xanax 1mg  refill Last OV---07/22/2013 Last refill---04/26/2013--#30 UDS 05/11/12 Low risk; however Neg for Xanax.  Please Advise

## 2013-08-09 ENCOUNTER — Telehealth: Payer: Self-pay

## 2013-08-09 MED ORDER — ALPRAZOLAM 1 MG PO TABS: ORAL_TABLET | ORAL | Status: DC

## 2013-08-09 NOTE — Telephone Encounter (Signed)
Refill x1   1 refill 

## 2013-08-09 NOTE — Telephone Encounter (Signed)
Last seen 07/22/13 and filled 04/26/13 #30.   Please advise      KP

## 2013-08-09 NOTE — Telephone Encounter (Signed)
Rx faxed.    KP 

## 2013-08-15 ENCOUNTER — Other Ambulatory Visit: Payer: Self-pay | Admitting: Family Medicine

## 2013-08-16 NOTE — Telephone Encounter (Signed)
Rx sent to the pharmacy by e-script.//AB/CMA 

## 2013-08-31 ENCOUNTER — Telehealth: Payer: Self-pay | Admitting: Neurology

## 2013-08-31 ENCOUNTER — Ambulatory Visit: Payer: Medicare Other | Admitting: Neurology

## 2013-08-31 NOTE — Telephone Encounter (Signed)
Pt called at 10:23AM to cancel her initial appt for today 08/31/13 at 1PM. She stated that she is sick.  She would like to r/s her appt. Is it okay for pt to r/s?

## 2013-08-31 NOTE — Telephone Encounter (Signed)
Ok to reschedule for another day, but if no-show or 2nd time same-day cancellation, will not reschedule patient.  Ancel Easler K. Posey Pronto, DO

## 2013-09-15 DIAGNOSIS — IMO0002 Reserved for concepts with insufficient information to code with codable children: Secondary | ICD-10-CM | POA: Diagnosis not present

## 2013-09-16 ENCOUNTER — Ambulatory Visit: Payer: Medicare Other | Admitting: Neurology

## 2013-09-19 ENCOUNTER — Telehealth: Payer: Self-pay | Admitting: Neurology

## 2013-09-19 NOTE — Telephone Encounter (Signed)
Pt no showed 09/16/13 NP appt w/ Dr. Posey Pronto. This is the 2nd no showed NP appt. No show letter sent to pt. Tish Frederickson to notify referring office / Gay[facility] Orem.

## 2013-09-20 ENCOUNTER — Other Ambulatory Visit: Payer: Self-pay | Admitting: Family Medicine

## 2013-09-20 ENCOUNTER — Encounter: Payer: Self-pay | Admitting: *Deleted

## 2013-09-20 DIAGNOSIS — IMO0002 Reserved for concepts with insufficient information to code with codable children: Secondary | ICD-10-CM | POA: Diagnosis not present

## 2013-09-29 DIAGNOSIS — IMO0002 Reserved for concepts with insufficient information to code with codable children: Secondary | ICD-10-CM | POA: Diagnosis not present

## 2013-10-01 ENCOUNTER — Encounter: Payer: Self-pay | Admitting: Gastroenterology

## 2013-10-08 DIAGNOSIS — IMO0002 Reserved for concepts with insufficient information to code with codable children: Secondary | ICD-10-CM | POA: Diagnosis not present

## 2013-10-15 ENCOUNTER — Ambulatory Visit: Payer: Medicare Other | Admitting: Neurology

## 2013-10-15 DIAGNOSIS — Z23 Encounter for immunization: Secondary | ICD-10-CM | POA: Diagnosis not present

## 2013-10-21 DIAGNOSIS — IMO0002 Reserved for concepts with insufficient information to code with codable children: Secondary | ICD-10-CM | POA: Diagnosis not present

## 2013-10-25 DIAGNOSIS — IMO0002 Reserved for concepts with insufficient information to code with codable children: Secondary | ICD-10-CM | POA: Diagnosis not present

## 2013-10-27 ENCOUNTER — Other Ambulatory Visit: Payer: Self-pay | Admitting: Family Medicine

## 2013-11-01 DIAGNOSIS — F3341 Major depressive disorder, recurrent, in partial remission: Secondary | ICD-10-CM | POA: Diagnosis not present

## 2013-11-09 DIAGNOSIS — F3341 Major depressive disorder, recurrent, in partial remission: Secondary | ICD-10-CM | POA: Diagnosis not present

## 2013-11-15 DIAGNOSIS — F3341 Major depressive disorder, recurrent, in partial remission: Secondary | ICD-10-CM | POA: Diagnosis not present

## 2013-11-17 ENCOUNTER — Other Ambulatory Visit: Payer: Self-pay | Admitting: Family Medicine

## 2013-11-27 ENCOUNTER — Other Ambulatory Visit: Payer: Self-pay | Admitting: Family Medicine

## 2013-11-29 ENCOUNTER — Other Ambulatory Visit: Payer: Self-pay | Admitting: Family Medicine

## 2013-11-29 NOTE — Telephone Encounter (Signed)
Last seen 07/22/13 and filled 05/05/13 #60 Please advise     KP

## 2013-12-03 ENCOUNTER — Ambulatory Visit: Payer: Medicare Other | Admitting: Gastroenterology

## 2013-12-27 ENCOUNTER — Other Ambulatory Visit: Payer: Self-pay | Admitting: Family Medicine

## 2013-12-27 NOTE — Telephone Encounter (Signed)
Last seen 07/25/13 and filled 08/09/13 #30 with 1 refill.   Please advise     KP

## 2014-01-18 DIAGNOSIS — F3341 Major depressive disorder, recurrent, in partial remission: Secondary | ICD-10-CM | POA: Diagnosis not present

## 2014-01-24 DIAGNOSIS — F3341 Major depressive disorder, recurrent, in partial remission: Secondary | ICD-10-CM | POA: Diagnosis not present

## 2014-01-25 ENCOUNTER — Ambulatory Visit: Payer: Medicare Other | Admitting: Neurology

## 2014-01-26 ENCOUNTER — Encounter: Payer: Self-pay | Admitting: Neurology

## 2014-01-26 ENCOUNTER — Ambulatory Visit (INDEPENDENT_AMBULATORY_CARE_PROVIDER_SITE_OTHER): Payer: Medicare Other | Admitting: Neurology

## 2014-01-26 VITALS — BP 120/84 | HR 102 | Ht 66.0 in | Wt 138.3 lb

## 2014-01-26 DIAGNOSIS — R202 Paresthesia of skin: Secondary | ICD-10-CM | POA: Diagnosis not present

## 2014-01-26 DIAGNOSIS — G621 Alcoholic polyneuropathy: Secondary | ICD-10-CM

## 2014-01-26 DIAGNOSIS — F1020 Alcohol dependence, uncomplicated: Secondary | ICD-10-CM

## 2014-01-26 NOTE — Patient Instructions (Addendum)
1.  Check blood work 2.  EMG of the arms, right > left 3.  Encouraged alcohol cessation 4.  Return to clinic in 76-months

## 2014-01-26 NOTE — Progress Notes (Signed)
[facility] Clinic Note - Initial Visit   Date: 01/26/2014  Toni [name] MRN: [id number] DOB: [birth year]   Dear Dr. Etter Sjogren:  Thank you for your kind referral of Toni [name] for consultation of numbness of the feet. Although her history is well known to you, please allow [place] to reiterate it for the purpose of our medical record. The patient was accompanied to the clinic by self.    History of Present Illness: Toni [name] is a 68 y.o. right-handed Caucasian female with COPD, depression, hyperlipidemia, hypertension, fibromyalgia, and bipolar disorder presenting for evaluation of numbness of her feet.  In May 2012, she had right hip surgery and developed numbness of the bottoms of her toes, described as if her socks were "bunching up".  A few years later, she developed similar sensation on her left toes.  Numbness is constant and she has not noticed any exacerbating or alleviating factors.  She denies any weakness, tingling, pain, or falls.    She endorses drinking 1 bottle of wine per day for at least the past 10 years.  She reports to cutting back to 0.5-1 bottle every 10 days.  She also complains of bilateral hand tingling and numbness involving the thumb and index finger.  Symptoms are intermittent and wake her up from sleeping.  No identifiable triggers.  She has not noticed any positional changes that brings it on.  She is more bothered by her hand symptoms than feet numbness, because when her hands are numb, she reports to even dropping objects.   Out-side paper records, electronic medical record, and images have been reviewed where available and summarized as:  MRI brain wo contrast 01/12/2013:  Negative  Lab Results  Component Value Date   TSH 1.22 12/17/2012   Lab Results  Component Value Date   VITAMINB12 410 12/17/2012   Lab Results  Component Value Date   HGBA1C 5.7 04/14/2007      Past Medical History  Diagnosis Date  . COPD  (chronic obstructive pulmonary disease)   . Depression   . Diverticulitis   . Hyperlipidemia   . Osteoarthritis   . Hypertension   . Bipolar disorder     Past Surgical History  Procedure Laterality Date  . Tonsillectomy and adenoidectomy    . Total hip arthroplasty      1979 and 1986  . Femur fracture surgery  09/2009  . Joint replacement  09/2009    R hip,  redone, 06/14/2010 [place],---  done secondary to fractured fractured     Medications:  Current Outpatient Prescriptions on File Prior to Visit  Medication Sig Dispense Refill  . ALPRAZolam (XANAX) 1 MG tablet TAKE 1/2 TABLET BY MOUTH THREE TIMES DAILY AS NEEDED 30 tablet 0  . B-COMPLEX CAPS Take 1 capsule by mouth daily.    . Biotin 1000 MCG tablet Take 1,000 mcg by mouth daily.    . Calcium Carbonate-Vit D-Min 1200-1000 MG-UNIT CHEW Chew 1 capsule by mouth daily.    . cholecalciferol (VITAMIN D) 1000 UNITS tablet Take 1,000 Units by mouth 3 (three) times daily.    . cyanocobalamin 1000 MCG tablet Take 100 mcg by mouth daily.    Marland Kitchen gabapentin (NEURONTIN) 600 MG tablet TAKE 1 TABLET BY MOUTH THREE TIMES DAILY 90 tablet 2  . ibuprofen (ADVIL,MOTRIN) 600 MG tablet TAKE 1 TABLET BY MOUTH EVERY 6 HOURS AS NEEDED 60 tablet 0  . lamoTRIgine (LAMICTAL) 100 MG tablet TAKE 1 TABLET BY MOUTH TWICE DAILY 60 tablet  2  . Multiple Vitamin (MULTIVITAMIN) tablet Take 1 tablet by mouth daily.    . Omega-3 Fatty Acids (FISH OIL) 1200 MG CAPS Take 1,200 capsules by mouth daily.     Marland Kitchen starch (ANUSOL) 51 % suppository Place 1 suppository rectally as needed for pain. 24 suppository 0  . tiZANidine (ZANAFLEX) 4 MG tablet TAKE 1 TO 2 TABLETS BY MOUTH EVERY 6 HOURS AS NEEDED 30 tablet 0   No current facility-administered medications on file prior to visit.    Allergies:  Allergies  Allergen Reactions  . Levofloxacin     REACTION: RASH/REDNESS    Family History: Family History  Problem Relation Age of Onset  . Stroke Mother   . Arthritis       mother family  . Depression      mother fanily  . Alcohol abuse      Both sides  . Cancer      mothers side  . Colon cancer Neg Hx     Social History: History   Social History  . Marital Status: Single    Spouse Name: N/A    Number of Children: N/A  . Years of Education: N/A   Occupational History  . Not on file.   Social History Main Topics  . Smoking status: Former Smoker    Quit date: 06/18/2011  . Smokeless tobacco: Never Used     Comment: marijuana  . Alcohol Use: 1.2 oz/week    2 Not specified per week  . Drug Use: 14.00 per week    Special: Marijuana     Comment: regular use  . Sexual Activity:    Partners: Male   Other Topics Concern  . Not on file   Social History Narrative   Lives alone in a one story home.  Retired from different jobs.  Education: college.  Has one child.     Review of Systems:  CONSTITUTIONAL: No fevers, chills, night sweats, or weight loss.   EYES: No visual changes or eye pain ENT: No hearing changes.  No history of nose bleeds.   RESPIRATORY: No cough, wheezing and shortness of breath.   CARDIOVASCULAR: Negative for chest pain, and palpitations.   GI: Negative for abdominal discomfort, blood in stools or black stools.  No recent change in bowel habits.   GU:  No history of incontinence.   MUSCLOSKELETAL: No history of joint pain or swelling.  No myalgias.   SKIN: Negative for lesions, rash, and itching.   HEMATOLOGY/ONCOLOGY: Negative for prolonged bleeding, bruising easily, and swollen nodes.  No history of cancer.   ENDOCRINE: Negative for cold or heat intolerance, polydipsia or goiter.   PSYCH:  +depression or anxiety symptoms.   NEURO: As Above.   Vital Signs:  BP 120/84 mmHg  Pulse 102  Ht 5\' 6"  (1.676 m)  Wt 138 lb 5 oz (62.738 kg)  BMI 22.33 kg/m2  SpO2 98%   General Medical Exam:   General:  Well appearing, comfortable.   Eyes/ENT: see cranial nerve examination.   Neck: No masses appreciated.  Full  range of motion without tenderness.  No carotid bruits. Respiratory:  Clear to auscultation, good air entry bilaterally.   Cardiac:  Regular rate and rhythm, no murmur.   Extremities:  No deformities, edema, or skin discoloration.  Skin:  No rashes or lesions.  Neurological Exam: MENTAL STATUS including orientation to time, place, person, recent and remote memory, attention span and concentration, language, and fund of knowledge is normal.  Speech is not dysarthric.  CRANIAL NERVES: II:  No visual field defects.  Unremarkable fundi.   III-IV-VI: Pupils equal round and reactive to light.  Normal conjugate, extra-ocular eye movements in all directions of gaze.  No nystagmus.  No ptosis.   V:  Normal facial sensation.   VII:  Normal facial symmetry and movements.   VIII:  Normal hearing and vestibular function.   IX-X:  Normal palatal movement.   XI:  Normal shoulder shrug and head rotation.   XII:  Normal tongue strength and range of motion, no deviation or fasciculation.  MOTOR:  No atrophy, fasciculations or abnormal movements.  No pronator drift.  Tone is normal.  Tinel's sign negative.  Right Upper Extremity:    Left Upper Extremity:    Deltoid  5/5   Deltoid  5/5   Biceps  5/5   Biceps  5/5   Triceps  5/5   Triceps  5/5   Wrist extensors  5/5   Wrist extensors  5/5   Wrist flexors  5/5   Wrist flexors  5/5   Finger extensors  5/5   Finger extensors  5/5   Finger flexors  5/5   Finger flexors  5/5   Dorsal interossei  5/5   Dorsal interossei  5/5   Abductor pollicis  5/5   Abductor pollicis  5/5   Tone (Ashworth scale)  0  Tone (Ashworth scale)  0   Right Lower Extremity:    Left Lower Extremity:    Hip flexors  5/5   Hip flexors  5/5   Hip extensors  5/5   Hip extensors  5/5   Knee flexors  5/5   Knee flexors  5/5   Knee extensors  5/5   Knee extensors  5/5   Dorsiflexors  5/5   Dorsiflexors  5/5   Plantarflexors  5/5   Plantarflexors  5/5   Toe extensors  5/5   Toe  extensors  5/5   Toe flexors  5/5   Toe flexors  5/5   Tone (Ashworth scale)  0  Tone (Ashworth scale)  0   MSRs:  Right                                                                 Left brachioradialis 3+  brachioradialis 3+  biceps 3+  biceps 3+  triceps 3+  triceps 3+  patellar 2+  patellar 2+  ankle jerk 1+  ankle jerk 2+  Hoffman no  Hoffman no  plantar response down  plantar response down   SENSORY:  Reduced vibration to 60% at the ankles and great toe bilaterally.  Temperature and pin prick intact.  Romberg's sign absent.   COORDINATION/GAIT: Normal finger-to- nose-finger and heel-to-shin.  Intact rapid alternating movements bilaterally.  Able to rise from a chair without using arms.  Antalgic gait due to structural deformity of leg length discrepancy and hip deformity.   IMPRESSION: 1.  Bilateral hand paresthesias, ?cervical radiculopathy especially with hyperreflexia of the arms vs entrapment neuropathy 2.  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 I had extensive discussion with the patient regarding the pathogenesis, etiology, management, and natural course of neuropathy. Neuropathy tends to be slowly progressive, especially if a treatable  etiology is not intervened upon, thus alcohol cessation was strongly advised 3.  Alcohol dependence   PLAN/RECOMMENDATIONS:  1.  Check copper, ceruloplasmin, zinc, vitamin B1, and vitamin B6 2.  EMG of the upper extremities, right > left 3.  Consider EMG of the legs going forward 4.  Encouraged alcohol cessation 5.  Return to clinic in 3 months.   The duration of this appointment visit was 45 minutes of face-to-face time with the patient.  Greater than 50% of this time was spent in counseling, explanation of diagnosis, planning of further management, and coordination of care.   Thank you for allowing me to participate in patient's care.  If I can answer any additional questions, I would be  pleased to do so.    Sincerely,    Donika K. Posey Pronto, DO

## 2014-01-29 LAB — COPPER, SERUM: COPPER: 152 ug/dL (ref 70–175)

## 2014-01-29 LAB — ZINC: ZINC: 83 ug/dL (ref 60–130)

## 2014-01-30 ENCOUNTER — Other Ambulatory Visit: Payer: Self-pay | Admitting: Family Medicine

## 2014-01-30 LAB — VITAMIN B6: Vitamin B6: 22.8 ng/mL — ABNORMAL HIGH (ref 2.1–21.7)

## 2014-01-31 LAB — VITAMIN B1: Vitamin B1 (Thiamine): 10 nmol/L (ref 8–30)

## 2014-01-31 LAB — CERULOPLASMIN: CERULOPLASMIN: 38 mg/dL (ref 18–53)

## 2014-02-02 DIAGNOSIS — F3341 Major depressive disorder, recurrent, in partial remission: Secondary | ICD-10-CM | POA: Diagnosis not present

## 2014-02-09 DIAGNOSIS — F3341 Major depressive disorder, recurrent, in partial remission: Secondary | ICD-10-CM | POA: Diagnosis not present

## 2014-02-22 DIAGNOSIS — F3341 Major depressive disorder, recurrent, in partial remission: Secondary | ICD-10-CM | POA: Diagnosis not present

## 2014-03-01 DIAGNOSIS — F3341 Major depressive disorder, recurrent, in partial remission: Secondary | ICD-10-CM | POA: Diagnosis not present

## 2014-03-08 ENCOUNTER — Other Ambulatory Visit: Payer: Self-pay | Admitting: Family Medicine

## 2014-03-10 ENCOUNTER — Other Ambulatory Visit: Payer: Self-pay | Admitting: Family Medicine

## 2014-03-10 DIAGNOSIS — F3341 Major depressive disorder, recurrent, in partial remission: Secondary | ICD-10-CM | POA: Diagnosis not present

## 2014-03-16 DIAGNOSIS — F3341 Major depressive disorder, recurrent, in partial remission: Secondary | ICD-10-CM | POA: Diagnosis not present

## 2014-03-24 DIAGNOSIS — F3341 Major depressive disorder, recurrent, in partial remission: Secondary | ICD-10-CM | POA: Diagnosis not present

## 2014-03-28 ENCOUNTER — Telehealth: Payer: Self-pay | Admitting: Neurology

## 2014-03-28 ENCOUNTER — Ambulatory Visit (INDEPENDENT_AMBULATORY_CARE_PROVIDER_SITE_OTHER): Payer: Medicare Other | Admitting: Neurology

## 2014-03-28 DIAGNOSIS — R202 Paresthesia of skin: Secondary | ICD-10-CM

## 2014-03-28 DIAGNOSIS — G621 Alcoholic polyneuropathy: Secondary | ICD-10-CM

## 2014-03-28 NOTE — Procedures (Signed)
[facility]  [address] [place]  [place], [place] [address] Tel: [phone] Fax:  [phone] Test Date:  03/28/2014  Patient: Toni [name] DOB: 1945/12/23 Physician: Narda Amber  Sex: Female Height: 5\' 6"  Ref Phys: Narda Amber  ID#: [id number]   Technician: Laureen Ochs R. NCS T.   Patient Complaints: Patient is a 69 year old female here for evaluation of both hand paresthesias right worse than left.  NCV & EMG Findings: Extensive electrodiagnostic testing of the right upper extremity and additional studies of the left shows:  1. Bilateral median sensory responses are prolonged in the amplitude is reduced on the right side. Bilateral ulnar and radial sensory responses are within normal limits.  2. Bilateral median motor responses are prolonged with preserved amplitude. Bilateral ulnar motor responses are within normal limits.  3. There is no evidence of active or chronic motor axon loss changes affecting any of the tested muscles. Motor unit configuration and pattern is normal.   Impression: 1. Bilateral median neuropathy at or distal to the wrist, consistent with the clinical diagnosis of carpal tunnel syndrome. Overall, these findings are moderate in degree electrically and worse on the right. 2. There is no evidence of a cervical radiculopathy affecting the upper extremities.   ___________________________ Narda Amber    Nerve Conduction Studies Anti Sensory Summary Table   Stim Site NR Peak (ms) Norm Peak (ms) P-T Amp (V) Norm P-T Amp  Left Median Anti Sensory (2nd Digit)  34C  Wrist    4.4 <3.8 23.1 >10  Right Median Anti Sensory (2nd Digit)  34C  Wrist    5.3 <3.8 6.8 >10  Left Radial Anti Sensory (Base 1st Digit)  34C  Wrist    2.2 <2.8 40.1 >10  Right Radial Anti Sensory (Base 1st Digit)  34C  Wrist    2.1 <2.8 41.8 >10  Left Ulnar Anti Sensory (5th Digit)  34C  Wrist    2.8 <3.2 25.4 >5  Right Ulnar Anti Sensory (5th Digit)  34C    Wrist    2.7 <3.2 26.7 >5   Motor Summary Table   Stim Site NR Onset (ms) Norm Onset (ms) O-P Amp (mV) Norm O-P Amp Site1 Site2 Delta-0 (ms) Dist (cm) Vel (m/s) Norm Vel (m/s)  Left Median Motor (Abd Poll Brev)  34C  Wrist    4.2 <4.0 9.2 >5 Elbow Wrist 6.0 30.0 50 >50  Elbow    10.2  9.0         Right Median Motor (Abd Poll Brev)  34C  Wrist    6.7 <4.0 6.8 >5 Elbow Wrist 5.3 28.0 53 >50  Elbow    12.0  6.5         Left Ulnar Motor (Abd Dig Minimi)  34C  Wrist    2.3 <3.1 11.0 >7 B Elbow Wrist 3.9 23.0 59 >50  B Elbow    6.2  10.7  A Elbow B Elbow 1.8 10.0 56 >50  A Elbow    8.0  10.2         Right Ulnar Motor (Abd Dig Minimi)  34C  Wrist    2.3 <3.1 10.4 >7 B Elbow Wrist 3.7 21.5 58 >50  B Elbow    6.0  9.7  A Elbow B Elbow 2.0 10.0 50 >50  A Elbow    8.0  9.4          F Wave Studies   NR F-Lat (ms) Lat Norm (  ms) L-R F-Lat (ms)  Left Ulnar (Mrkrs) (Abd Dig Min)  34C     27.89 <33 1.18  Right Ulnar (Mrkrs) (Abd Dig Min)  34C     26.72 <33 1.18   EMG   Side Muscle Ins Act Fibs Psw Fasc Number Recrt Dur Dur. Amp Amp. Poly Poly. Comment  Right 1stDorInt Nml Nml Nml Nml Nml Nml Nml Nml Nml Nml Nml Nml N/A  Right Abd Poll Brev Nml Nml Nml Nml Nml Nml Nml Nml Nml Nml Nml Nml N/A  Right PronatorTeres Nml Nml Nml Nml Nml Nml Nml Nml Nml Nml Nml Nml N/A  Right Biceps Nml Nml Nml Nml Nml Nml Nml Nml Nml Nml Nml Nml N/A  Right Triceps Nml Nml Nml Nml Nml Nml Nml Nml Nml Nml Nml Nml N/A  Right Deltoid Nml Nml Nml Nml Nml Nml Nml Nml Nml Nml Nml Nml N/A  Left 1stDorInt Nml Nml Nml Nml Nml Nml Nml Nml Nml Nml Nml Nml N/A  Left Abd Poll Brev Nml Nml Nml Nml Nml Nml Nml Nml Nml Nml Nml Nml N/A  Left Ext Indicis Nml Nml Nml Nml Nml Nml Nml Nml Nml Nml Nml Nml N/A  Left PronatorTeres Nml Nml Nml Nml Nml Nml Nml Nml Nml Nml Nml Nml N/A  Left Biceps Nml Nml Nml Nml Nml Nml Nml Nml Nml Nml Nml Nml N/A  Left Triceps Nml Nml Nml Nml Nml Nml Nml Nml Nml Nml Nml Nml N/A  Left Deltoid Nml  Nml Nml Nml Nml Nml Nml Nml Nml Nml Nml Nml N/A      Waveforms:

## 2014-03-28 NOTE — Telephone Encounter (Signed)
Pt wants a copy of all of the test mailed to her that she has had done

## 2014-03-29 NOTE — Telephone Encounter (Signed)
Copy of EMG mailed to patient.

## 2014-04-03 ENCOUNTER — Other Ambulatory Visit: Payer: Self-pay | Admitting: Family Medicine

## 2014-04-04 DIAGNOSIS — F3341 Major depressive disorder, recurrent, in partial remission: Secondary | ICD-10-CM | POA: Diagnosis not present

## 2014-04-04 NOTE — Telephone Encounter (Signed)
Last seen 07/22/13 and filled 03/09/14 #60   Please advise     KP

## 2014-04-12 DIAGNOSIS — F3341 Major depressive disorder, recurrent, in partial remission: Secondary | ICD-10-CM | POA: Diagnosis not present

## 2014-04-18 DIAGNOSIS — F3341 Major depressive disorder, recurrent, in partial remission: Secondary | ICD-10-CM | POA: Diagnosis not present

## 2014-04-26 DIAGNOSIS — F3341 Major depressive disorder, recurrent, in partial remission: Secondary | ICD-10-CM | POA: Diagnosis not present

## 2014-04-28 ENCOUNTER — Encounter: Payer: Self-pay | Admitting: Neurology

## 2014-04-28 ENCOUNTER — Ambulatory Visit (INDEPENDENT_AMBULATORY_CARE_PROVIDER_SITE_OTHER): Payer: Medicare Other | Admitting: Neurology

## 2014-04-28 ENCOUNTER — Other Ambulatory Visit: Payer: Self-pay | Admitting: Family Medicine

## 2014-04-28 VITALS — BP 110/80 | HR 102 | Ht 66.0 in | Wt 147.2 lb

## 2014-04-28 DIAGNOSIS — G5602 Carpal tunnel syndrome, left upper limb: Secondary | ICD-10-CM

## 2014-04-28 DIAGNOSIS — G621 Alcoholic polyneuropathy: Secondary | ICD-10-CM | POA: Diagnosis not present

## 2014-04-28 DIAGNOSIS — G5601 Carpal tunnel syndrome, right upper limb: Secondary | ICD-10-CM

## 2014-04-28 DIAGNOSIS — G5603 Carpal tunnel syndrome, bilateral upper limbs: Secondary | ICD-10-CM

## 2014-04-28 NOTE — Progress Notes (Signed)
Follow-up Visit   Date: 04/28/2014   Toni [name] MRN: [id number] DOB: [birth year]   Interim History: Toni [name] is a 69 y.o. 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  The patient was accompanied to the clinic by self.  History of present illness: In May 2012, she had right hip surgery and developed numbness of the bottoms of her toes, described as if her socks were "bunching up". A few years later, she developed similar sensation on her left toes. Numbness is constant and she has not noticed any exacerbating or alleviating factors. She denies any weakness, tingling, pain, or falls.   She endorses drinking 1 bottle of wine per day for at least the past 10 years. She reports to cutting back to 0.5-1 bottle which lasts 10 days.  She also complains of bilateral hand tingling and numbness involving the thumb and index finger. Symptoms are intermittent and wake her up from sleeping. No identifiable triggers. She has not noticed any positional changes that brings it on. She is more bothered by her hand symptoms than feet numbness, because when her hands are numb, she reports to even dropping objects.   UPDATE 04/28/2014:  She started using a hard wrist splint and did not tolerate it whatsoever and it actually made her pain worse, so bought another softer wrist splint.  She had not started to use it yet because she wanted to discuss her options with me.  Regarding her feet paresthesias, symptoms are unchanged.  She does report to drinking less wine and has not had anything in the past 10 days.  She is concerned that personal stressors may make her symptoms worse.  Medications:  Current Outpatient Prescriptions on File Prior to Visit  Medication Sig Dispense Refill  . ALPRAZolam (XANAX) 1 MG tablet TAKE 1/2 TABLET BY MOUTH  THREE TIMES DAILY AS NEEDED 30 tablet 0  . Ascorbic Acid (VITAMIN C PO) Take by mouth.    . B-COMPLEX CAPS Take 1 capsule by mouth daily.    . Biotin 1000 MCG tablet Take 1,000 mcg by mouth daily.    . Calcium Carbonate-Vit D-Min 1200-1000 MG-UNIT CHEW Chew 1 capsule by mouth daily.    . cholecalciferol (VITAMIN D) 1000 UNITS tablet Take 1,000 Units by mouth 3 (three) times daily.    . cyanocobalamin 1000 MCG tablet Take 100 mcg by mouth daily.    Marland Kitchen gabapentin (NEURONTIN) 600 MG tablet TAKE 1 TABLET BY MOUTH THREE TIMES DAILY 90 tablet 5  . ibuprofen (ADVIL,MOTRIN) 600 MG tablet TAKE 1 TABLET BY MOUTH EVERY 6 HOURS AS NEEDED 60 tablet 0  . lamoTRIgine (LAMICTAL) 100 MG tablet TAKE 1 TABLET BY MOUTH TWICE DAILY. DUE FOR OFFICE VISIT 60 tablet 0  . MAGNESIUM OXIDE PO Take by mouth.    . Multiple Vitamin (MULTIVITAMIN) tablet Take 1 tablet by mouth daily.    . Multiple Vitamins-Minerals (ZINC PO) Take by mouth.    . nortriptyline (PAMELOR) 50 MG capsule   0  . Omega-3 Fatty Acids (FISH OIL) 1200 MG CAPS Take 1,200 capsules by mouth daily.     Marland Kitchen starch (ANUSOL) 51 % suppository Place 1 suppository rectally as needed for pain. 24 suppository 0  . temazepam (RESTORIL) 15 MG capsule   1  . tiZANidine (ZANAFLEX) 4 MG tablet TAKE 1 TO 2 TABLETS BY MOUTH EVERY 6 HOURS AS NEEDED 30 tablet 0  No current facility-administered medications on file prior to visit.    Allergies:  Allergies  Allergen Reactions  . Levofloxacin     REACTION: RASH/REDNESS    Review of Systems:  CONSTITUTIONAL: No fevers, chills, night sweats, or weight loss.  EYES: No visual changes or eye pain ENT: No hearing changes.  No history of nose bleeds.   RESPIRATORY: No cough, wheezing and shortness of breath.   CARDIOVASCULAR: Negative for chest pain, and palpitations.   GI: Negative for abdominal discomfort, blood in stools or black stools.  No recent change in bowel habits.   GU:  No history of incontinence.     MUSCLOSKELETAL: No history of joint pain or swelling.  No myalgias.   SKIN: Negative for lesions, rash, and itching.   ENDOCRINE: Negative for cold or heat intolerance, polydipsia or goiter.   PSYCH:  + depression or anxiety symptoms.   NEURO: As Above.   Vital Signs:  BP 110/80 mmHg  Pulse 102  Ht 5\' 6"  (1.676 m)  Wt 147 lb 4 oz (66.792 kg)  BMI 23.78 kg/m2  SpO2 98%  Neurological Exam: MENTAL STATUS including orientation to time, place, person, recent and remote memory, attention span and concentration, language, and fund of knowledge is normal.  Speech is not dysarthric.  CRANIAL NERVES  Face is symmetric. Palate elevates symmetrically.  Tongue is midline.  MOTOR:  Motor strength is 5/5 in all extremities, except mild bilateral APB weakness.  No pronator drift.  Tone is normal.    MSRs:  Right                                                                 Left brachioradialis 3+  brachioradialis 3+  biceps 3+  biceps 3+  triceps 3+  triceps 3+  patellar 2+  patellar 2+  ankle jerk 1+  ankle jerk 2+  Hoffman no  Hoffman no  plantar response down  plantar response down   SENSORY: Reduced vibration at ankles bilaterally  COORDINATION/GAIT:  Antalgic but stable gait.   Data: EMG 04/28/2014: Impression: 1. Bilateral median neuropathy at or distal to the wrist, consistent with the clinical diagnosis of carpal tunnel syndrome. Overall, these findings are moderate in degree electrically and worse on the right. 2. There is no evidence of a cervical radiculopathy affecting the upper extremities.  Labs 01/16/2014:  Copper 152, ceruloplasmin 38, zinc 83, vmitain B1, vitamin B6 22.8   IMPRESSION/PLAN: 1. Bilateral carpal tunnel syndrome, worse on the right  - Confirmed by EMG which showed moderate CTS bilaterally  - Start using wrist splint and encouraged avoid hyperflexion of the wrist  - Offered to start PT, but would like to give wrist splint adequate trial  2. 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  - Encouraged alcohol cessation and she is making efforts to reduce it  - Neuropathy labs returned normal  3. Alcohol dependence  - Praised her for cutting back on her alcohol intake and encouraged her to continue to limit   Return to clinic in 79-months   The duration of this appointment visit was 25 minutes of face-to-face time with the patient.  Greater than 50% of this time was spent in counseling, explanation of diagnosis, planning of further management,  and coordination of care.   Thank you for allowing me to participate in patient's care.  If I can answer any additional questions, I would be pleased to do so.    Sincerely,    Donika K. Posey Pronto, DO

## 2014-04-29 NOTE — Telephone Encounter (Signed)
30 day supply of lamictal sent to pharmacy. Pt last seen by PCP 06/2013 and has no future appts scheduled. Please advise when pt should follow up in the office?

## 2014-04-29 NOTE — Telephone Encounter (Signed)
Informed patient of med refill and patient wanted to be seen in May. cpe schedule for may

## 2014-04-29 NOTE — Telephone Encounter (Signed)
Please call pt to arrange follow up in June per PCP recommendation:  Rosalita Chessman, DO   Sent: Fri April 29, 2014 10:08 AM    To: Ronny Flurry, [facility]        Message     June

## 2014-05-10 DIAGNOSIS — F3341 Major depressive disorder, recurrent, in partial remission: Secondary | ICD-10-CM | POA: Diagnosis not present

## 2014-05-17 ENCOUNTER — Telehealth: Payer: Self-pay | Admitting: Family Medicine

## 2014-05-17 NOTE — Telephone Encounter (Signed)
Pre Visit letter sent  °

## 2014-05-20 DIAGNOSIS — F3341 Major depressive disorder, recurrent, in partial remission: Secondary | ICD-10-CM | POA: Diagnosis not present

## 2014-05-23 DIAGNOSIS — F3341 Major depressive disorder, recurrent, in partial remission: Secondary | ICD-10-CM | POA: Diagnosis not present

## 2014-06-06 ENCOUNTER — Encounter: Payer: Medicare Other | Admitting: Family Medicine

## 2014-06-08 ENCOUNTER — Telehealth: Payer: Self-pay | Admitting: Family Medicine

## 2014-06-08 NOTE — Telephone Encounter (Signed)
°  Relation to pt: self  Call back number: [phone] Pharmacy: [facility] - [place], [place] [phone] (Phone) [phone] (Fax         Reason for call:  Pt requesting a refill lamoTRIgine (LAMICTAL) 100 MG tablet

## 2014-06-08 NOTE — Telephone Encounter (Signed)
Pt was no show for appt on 06/06/14- rescheduled. No letter sent. Charge the pt?

## 2014-06-08 NOTE — Telephone Encounter (Signed)
No -- she she was late because she said she got lost , I think

## 2014-06-09 MED ORDER — LAMOTRIGINE 100 MG PO TABS: 100.0000 mg | ORAL_TABLET | Freq: Two times a day (BID) | ORAL | Status: DC

## 2014-06-09 NOTE — Telephone Encounter (Signed)
Rx faxed.    KP 

## 2014-06-14 DIAGNOSIS — F3341 Major depressive disorder, recurrent, in partial remission: Secondary | ICD-10-CM | POA: Diagnosis not present

## 2014-06-24 DIAGNOSIS — F3341 Major depressive disorder, recurrent, in partial remission: Secondary | ICD-10-CM | POA: Diagnosis not present

## 2014-06-30 ENCOUNTER — Other Ambulatory Visit: Payer: Self-pay | Admitting: Family Medicine

## 2014-06-30 NOTE — Telephone Encounter (Signed)
Last seen 07/22/13 and filled 12/27/13 #30 Pending apt on 08/08/14  Please advise     KP

## 2014-07-01 LAB — HM MAMMOGRAPHY

## 2014-07-06 DIAGNOSIS — F3341 Major depressive disorder, recurrent, in partial remission: Secondary | ICD-10-CM | POA: Diagnosis not present

## 2014-07-11 DIAGNOSIS — F3341 Major depressive disorder, recurrent, in partial remission: Secondary | ICD-10-CM | POA: Diagnosis not present

## 2014-07-13 DIAGNOSIS — F3341 Major depressive disorder, recurrent, in partial remission: Secondary | ICD-10-CM | POA: Diagnosis not present

## 2014-07-18 ENCOUNTER — Other Ambulatory Visit: Payer: Self-pay | Admitting: Family Medicine

## 2014-07-18 DIAGNOSIS — F3341 Major depressive disorder, recurrent, in partial remission: Secondary | ICD-10-CM | POA: Diagnosis not present

## 2014-07-19 ENCOUNTER — Telehealth: Payer: Self-pay | Admitting: Family Medicine

## 2014-07-19 NOTE — Telephone Encounter (Signed)
pre visit letter mailed 07/18/14

## 2014-07-26 DIAGNOSIS — F3341 Major depressive disorder, recurrent, in partial remission: Secondary | ICD-10-CM | POA: Diagnosis not present

## 2014-08-02 DIAGNOSIS — F3341 Major depressive disorder, recurrent, in partial remission: Secondary | ICD-10-CM | POA: Diagnosis not present

## 2014-08-05 ENCOUNTER — Telehealth: Payer: Self-pay | Admitting: *Deleted

## 2014-08-05 ENCOUNTER — Encounter: Payer: Self-pay | Admitting: *Deleted

## 2014-08-05 NOTE — Telephone Encounter (Signed)
Pre-Visit Call completed with patient and chart updated.   Pre-Visit Info documented in Specialty Comments under [employer].    

## 2014-08-08 ENCOUNTER — Encounter: Payer: Self-pay | Admitting: Family Medicine

## 2014-08-08 ENCOUNTER — Ambulatory Visit (HOSPITAL_BASED_OUTPATIENT_CLINIC_OR_DEPARTMENT_OTHER)
Admission: RE | Admit: 2014-08-08 | Discharge: 2014-08-08 | Disposition: A | Discharge status: Home / Self Care | Payer: Medicare Other | Source: Ambulatory Visit | Attending: Family Medicine | Admitting: Family Medicine

## 2014-08-08 ENCOUNTER — Ambulatory Visit (INDEPENDENT_AMBULATORY_CARE_PROVIDER_SITE_OTHER): Payer: Medicare Other | Admitting: Family Medicine

## 2014-08-08 ENCOUNTER — Other Ambulatory Visit (HOSPITAL_COMMUNITY)
Admission: RE | Admit: 2014-08-08 | Discharge: 2014-08-08 | Disposition: A | Discharge status: Home / Self Care | Payer: Medicare Other | Source: Ambulatory Visit | Attending: Family Medicine | Admitting: Family Medicine

## 2014-08-08 VITALS — BP 114/68 | HR 95 | Temp 98.1°F | Ht 65.0 in | Wt 141.4 lb

## 2014-08-08 DIAGNOSIS — Z124 Encounter for screening for malignant neoplasm of cervix: Secondary | ICD-10-CM

## 2014-08-08 DIAGNOSIS — M545 Low back pain: Secondary | ICD-10-CM | POA: Diagnosis not present

## 2014-08-08 DIAGNOSIS — M25552 Pain in left hip: Secondary | ICD-10-CM | POA: Insufficient documentation

## 2014-08-08 DIAGNOSIS — G8929 Other chronic pain: Secondary | ICD-10-CM | POA: Insufficient documentation

## 2014-08-08 DIAGNOSIS — M25562 Pain in left knee: Secondary | ICD-10-CM

## 2014-08-08 DIAGNOSIS — Z0000 Encounter for general adult medical examination without abnormal findings: Secondary | ICD-10-CM | POA: Diagnosis not present

## 2014-08-08 DIAGNOSIS — F329 Major depressive disorder, single episode, unspecified: Secondary | ICD-10-CM

## 2014-08-08 DIAGNOSIS — Z23 Encounter for immunization: Secondary | ICD-10-CM | POA: Diagnosis not present

## 2014-08-08 DIAGNOSIS — M11262 Other chondrocalcinosis, left knee: Secondary | ICD-10-CM | POA: Diagnosis not present

## 2014-08-08 DIAGNOSIS — M1712 Unilateral primary osteoarthritis, left knee: Secondary | ICD-10-CM | POA: Diagnosis not present

## 2014-08-08 DIAGNOSIS — Z96642 Presence of left artificial hip joint: Secondary | ICD-10-CM | POA: Diagnosis not present

## 2014-08-08 DIAGNOSIS — M898X8 Other specified disorders of bone, other site: Secondary | ICD-10-CM | POA: Diagnosis not present

## 2014-08-08 DIAGNOSIS — Z1151 Encounter for screening for human papillomavirus (HPV): Secondary | ICD-10-CM | POA: Diagnosis not present

## 2014-08-08 DIAGNOSIS — F322 Major depressive disorder, single episode, severe without psychotic features: Secondary | ICD-10-CM

## 2014-08-08 DIAGNOSIS — E785 Hyperlipidemia, unspecified: Secondary | ICD-10-CM

## 2014-08-08 DIAGNOSIS — Z96641 Presence of right artificial hip joint: Secondary | ICD-10-CM | POA: Diagnosis not present

## 2014-08-08 DIAGNOSIS — Z96643 Presence of artificial hip joint, bilateral: Secondary | ICD-10-CM | POA: Diagnosis not present

## 2014-08-08 DIAGNOSIS — K5730 Diverticulosis of large intestine without perforation or abscess without bleeding: Secondary | ICD-10-CM

## 2014-08-08 LAB — CBC WITH DIFFERENTIAL/PLATELET
Basophils Absolute: 0 10*3/uL (ref 0.0–0.1)
Basophils Relative: 0.3 % (ref 0.0–3.0)
EOS ABS: 0.1 10*3/uL (ref 0.0–0.7)
Eosinophils Relative: 0.9 % (ref 0.0–5.0)
HEMATOCRIT: 42.5 % (ref 36.0–46.0)
Hemoglobin: 14.5 g/dL (ref 12.0–15.0)
Lymphocytes Relative: 26 % (ref 12.0–46.0)
Lymphs Abs: 2.2 10*3/uL (ref 0.7–4.0)
MCHC: 34.1 g/dL (ref 30.0–36.0)
MCV: 94.3 fl (ref 78.0–100.0)
Monocytes Absolute: 0.5 10*3/uL (ref 0.1–1.0)
Monocytes Relative: 6 % (ref 3.0–12.0)
NEUTROS PCT: 66.8 % (ref 43.0–77.0)
Neutro Abs: 5.7 10*3/uL (ref 1.4–7.7)
Platelets: 311 10*3/uL (ref 150.0–400.0)
RBC: 4.51 Mil/uL (ref 3.87–5.11)
RDW: 14.4 % (ref 11.5–15.5)
WBC: 8.6 10*3/uL (ref 4.0–10.5)

## 2014-08-08 LAB — HEPATIC FUNCTION PANEL
ALK PHOS: 60 U/L (ref 39–117)
ALT: 10 U/L (ref 0–35)
AST: 18 U/L (ref 0–37)
Albumin: 4.8 g/dL (ref 3.5–5.2)
Bilirubin, Direct: 0.1 mg/dL (ref 0.0–0.3)
TOTAL PROTEIN: 7.7 g/dL (ref 6.0–8.3)
Total Bilirubin: 0.6 mg/dL (ref 0.2–1.2)

## 2014-08-08 LAB — BASIC METABOLIC PANEL
BUN: 9 mg/dL (ref 6–23)
CO2: 30 mEq/L (ref 19–32)
Calcium: 10.2 mg/dL (ref 8.4–10.5)
Chloride: 95 mEq/L — ABNORMAL LOW (ref 96–112)
Creatinine, Ser: 0.8 mg/dL (ref 0.40–1.20)
GFR: 75.76 mL/min (ref >60.00)
Glucose, Bld: 105 mg/dL — ABNORMAL HIGH (ref 70–99)
Potassium: 4 mEq/L (ref 3.5–5.1)
Sodium: 132 mEq/L — ABNORMAL LOW (ref 135–145)

## 2014-08-08 LAB — LIPID PANEL
CHOLESTEROL: 259 mg/dL — AB (ref 0–200)
HDL: 65.4 mg/dL (ref >39.00)
LDL Cholesterol: 165 mg/dL — ABNORMAL HIGH (ref 0–99)
NonHDL: 193.6
TRIGLYCERIDES: 144 mg/dL (ref 0.0–149.0)
Total CHOL/HDL Ratio: 4
VLDL: 28.8 mg/dL (ref 0.0–40.0)

## 2014-08-08 MED ORDER — IBUPROFEN 600 MG PO TABS: 600.0000 mg | ORAL_TABLET | Freq: Four times a day (QID) | ORAL | Status: DC | PRN

## 2014-08-08 NOTE — Progress Notes (Signed)
Pre visit review using our clinic review tool, if applicable. No additional management support is needed unless otherwise documented below in the visit note. 

## 2014-08-08 NOTE — Patient Instructions (Signed)
Preventive Care for Adults A healthy lifestyle and preventive care can promote health and wellness. Preventive health guidelines for women include the following key practices.  A routine yearly physical is a good way to check with your health care provider about your health and preventive screening. It is a chance to share any concerns and updates on your health and to receive a thorough exam.  Visit your dentist for a routine exam and preventive care every 6 months. Brush your teeth twice a day and floss once a day. Good oral hygiene prevents tooth decay and gum disease.  The frequency of eye exams is based on your age, health, family medical history, use of contact lenses, and other factors. Follow your health care provider's recommendations for frequency of eye exams.  Eat a healthy diet. Foods like vegetables, fruits, whole grains, low-fat dairy products, and lean protein foods contain the nutrients you need without too many calories. Decrease your intake of foods high in solid fats, added sugars, and salt. Eat the right amount of calories for you.Get information about a proper diet from your health care provider, if necessary.  Regular physical exercise is one of the most important things you can do for your health. Most adults should get at least 150 minutes of moderate-intensity exercise (any activity that increases your heart rate and causes you to sweat) each week. In addition, most adults need muscle-strengthening exercises on 2 or more days a week.  Maintain a healthy weight. The body mass index (BMI) is a screening tool to identify possible weight problems. It provides an estimate of body fat based on height and weight. Your health care provider can find your BMI and can help you achieve or maintain a healthy weight.For adults 20 years and older:  A BMI below 18.5 is considered underweight.  A BMI of 18.5 to 24.9 is normal.  A BMI of 25 to 29.9 is considered overweight.  A BMI of  30 and above is considered obese.  Maintain normal blood lipids and cholesterol levels by exercising and minimizing your intake of saturated fat. Eat a balanced diet with plenty of fruit and vegetables. Blood tests for lipids and cholesterol should begin at age 76 and be repeated every 5 years. If your lipid or cholesterol levels are high, you are over 50, or you are at high risk for heart disease, you may need your cholesterol levels checked more frequently.Ongoing high lipid and cholesterol levels should be treated with medicines if diet and exercise are not working.  If you smoke, find out from your health care provider how to quit. If you do not use tobacco, do not start.  Lung cancer screening is recommended for adults aged 22-80 years who are at high risk for developing lung cancer because of a history of smoking. A yearly low-dose CT scan of the lungs is recommended for people who have at least a 30-pack-year history of smoking and are a current smoker or have quit within the past 15 years. A pack year of smoking is smoking an average of 1 pack of cigarettes a day for 1 year (for example: 1 pack a day for 30 years or 2 packs a day for 15 years). Yearly screening should continue until the smoker has stopped smoking for at least 15 years. Yearly screening should be stopped for people who develop a health problem that would prevent them from having lung cancer treatment.  If you are pregnant, do not drink alcohol. If you are breastfeeding,  be very cautious about drinking alcohol. If you are not pregnant and choose to drink alcohol, do not have more than 1 drink per day. One drink is considered to be 12 ounces (355 mL) of beer, 5 ounces (148 mL) of wine, or 1.5 ounces (44 mL) of liquor.  Avoid use of street drugs. Do not share needles with anyone. Ask for help if you need support or instructions about stopping the use of drugs.  High blood pressure causes heart disease and increases the risk of  stroke. Your blood pressure should be checked at least every 1 to 2 years. Ongoing high blood pressure should be treated with medicines if weight loss and exercise do not work.  If you are 3-86 years old, ask your health care provider if you should take aspirin to prevent strokes.  Diabetes screening involves taking a blood sample to check your fasting blood sugar level. This should be done once every 3 years, after age 67, if you are within normal weight and without risk factors for diabetes. Testing should be considered at a younger age or be carried out more frequently if you are overweight and have at least 1 risk factor for diabetes.  Breast cancer screening is essential preventive care for women. You should practice "breast self-awareness." This means understanding the normal appearance and feel of your breasts and may include breast self-examination. Any changes detected, no matter how small, should be reported to a health care provider. Women in their 8s and 30s should have a clinical breast exam (CBE) by a health care provider as part of a regular health exam every 1 to 3 years. After age 70, women should have a CBE every year. Starting at age 25, women should consider having a mammogram (breast X-ray test) every year. Women who have a family history of breast cancer should talk to their health care provider about genetic screening. Women at a high risk of breast cancer should talk to their health care providers about having an MRI and a mammogram every year.  Breast cancer gene (BRCA)-related cancer risk assessment is recommended for women who have family members with BRCA-related cancers. BRCA-related cancers include breast, ovarian, tubal, and peritoneal cancers. Having family members with these cancers may be associated with an increased risk for harmful changes (mutations) in the breast cancer genes BRCA1 and BRCA2. Results of the assessment will determine the need for genetic counseling and  BRCA1 and BRCA2 testing.  Routine pelvic exams to screen for cancer are no longer recommended for nonpregnant women who are considered low risk for cancer of the pelvic organs (ovaries, uterus, and vagina) and who do not have symptoms. Ask your health care provider if a screening pelvic exam is right for you.  If you have had past treatment for cervical cancer or a condition that could lead to cancer, you need Pap tests and screening for cancer for at least 20 years after your treatment. If Pap tests have been discontinued, your risk factors (such as having a new sexual partner) need to be reassessed to determine if screening should be resumed. Some women have medical problems that increase the chance of getting cervical cancer. In these cases, your health care provider may recommend more frequent screening and Pap tests.  The HPV test is an additional test that may be used for cervical cancer screening. The HPV test looks for the virus that can cause the cell changes on the cervix. The cells collected during the Pap test can be  tested for HPV. The HPV test could be used to screen women aged 30 years and older, and should be used in women of any age who have unclear Pap test results. After the age of 30, women should have HPV testing at the same frequency as a Pap test.  Colorectal cancer can be detected and often prevented. Most routine colorectal cancer screening begins at the age of 50 years and continues through age 75 years. However, your health care provider may recommend screening at an earlier age if you have risk factors for colon cancer. On a yearly basis, your health care provider may provide home test kits to check for hidden blood in the stool. Use of a small camera at the end of a tube, to directly examine the colon (sigmoidoscopy or colonoscopy), can detect the earliest forms of colorectal cancer. Talk to your health care provider about this at age 50, when routine screening begins. Direct  exam of the colon should be repeated every 5-10 years through age 75 years, unless early forms of pre-cancerous polyps or small growths are found.  People who are at an increased risk for hepatitis B should be screened for this virus. You are considered at high risk for hepatitis B if:  You were born in a country where hepatitis B occurs often. Talk with your health care provider about which countries are considered high risk.  Your parents were born in a high-risk country and you have not received a shot to protect against hepatitis B (hepatitis B vaccine).  You have HIV or AIDS.  You use needles to inject street drugs.  You live with, or have sex with, someone who has hepatitis B.  You get hemodialysis treatment.  You take certain medicines for conditions like cancer, organ transplantation, and autoimmune conditions.  Hepatitis C blood testing is recommended for all people born from 1945 through 1965 and any individual with known risks for hepatitis C.  Practice safe sex. Use condoms and avoid high-risk sexual practices to reduce the spread of sexually transmitted infections (STIs). STIs include gonorrhea, chlamydia, syphilis, trichomonas, herpes, HPV, and human immunodeficiency virus (HIV). Herpes, HIV, and HPV are viral illnesses that have no cure. They can result in disability, cancer, and death.  You should be screened for sexually transmitted illnesses (STIs) including gonorrhea and chlamydia if:  You are sexually active and are younger than 24 years.  You are older than 24 years and your health care provider tells you that you are at risk for this type of infection.  Your sexual activity has changed since you were last screened and you are at an increased risk for chlamydia or gonorrhea. Ask your health care provider if you are at risk.  If you are at risk of being infected with HIV, it is recommended that you take a prescription medicine daily to prevent HIV infection. This is  called preexposure prophylaxis (PrEP). You are considered at risk if:  You are a heterosexual woman, are sexually active, and are at increased risk for HIV infection.  You take drugs by injection.  You are sexually active with a partner who has HIV.  Talk with your health care provider about whether you are at high risk of being infected with HIV. If you choose to begin PrEP, you should first be tested for HIV. You should then be tested every 3 months for as long as you are taking PrEP.  Osteoporosis is a disease in which the bones lose minerals and strength   with aging. This can result in serious bone fractures or breaks. The risk of osteoporosis can be identified using a bone density scan. Women ages 65 years and over and women at risk for fractures or osteoporosis should discuss screening with their health care providers. Ask your health care provider whether you should take a calcium supplement or vitamin D to reduce the rate of osteoporosis.  Menopause can be associated with physical symptoms and risks. Hormone replacement therapy is available to decrease symptoms and risks. You should talk to your health care provider about whether hormone replacement therapy is right for you.  Use sunscreen. Apply sunscreen liberally and repeatedly throughout the day. You should seek shade when your shadow is shorter than you. Protect yourself by wearing long sleeves, pants, a wide-brimmed hat, and sunglasses year round, whenever you are outdoors.  Once a month, do a whole body skin exam, using a mirror to look at the skin on your back. Tell your health care provider of new moles, moles that have irregular borders, moles that are larger than a pencil eraser, or moles that have changed in shape or color.  Stay current with required vaccines (immunizations).  Influenza vaccine. All adults should be immunized every year.  Tetanus, diphtheria, and acellular pertussis (Td, Tdap) vaccine. Pregnant women should  receive 1 dose of Tdap vaccine during each pregnancy. The dose should be obtained regardless of the length of time since the last dose. Immunization is preferred during the 27th-36th week of gestation. An adult who has not previously received Tdap or who does not know her vaccine status should receive 1 dose of Tdap. This initial dose should be followed by tetanus and diphtheria toxoids (Td) booster doses every 10 years. Adults with an unknown or incomplete history of completing a 3-dose immunization series with Td-containing vaccines should begin or complete a primary immunization series including a Tdap dose. Adults should receive a Td booster every 10 years.  Varicella vaccine. An adult without evidence of immunity to varicella should receive 2 doses or a second dose if she has previously received 1 dose. Pregnant females who do not have evidence of immunity should receive the first dose after pregnancy. This first dose should be obtained before leaving the health care facility. The second dose should be obtained 4-8 weeks after the first dose.  Human papillomavirus (HPV) vaccine. Females aged 13-26 years who have not received the vaccine previously should obtain the 3-dose series. The vaccine is not recommended for use in pregnant females. However, pregnancy testing is not needed before receiving a dose. If a female is found to be pregnant after receiving a dose, no treatment is needed. In that case, the remaining doses should be delayed until after the pregnancy. Immunization is recommended for any person with an immunocompromised condition through the age of 26 years if she did not get any or all doses earlier. During the 3-dose series, the second dose should be obtained 4-8 weeks after the first dose. The third dose should be obtained 24 weeks after the first dose and 16 weeks after the second dose.  Zoster vaccine. One dose is recommended for adults aged 60 years or older unless certain conditions are  present.  Measles, mumps, and rubella (MMR) vaccine. Adults born before 1957 generally are considered immune to measles and mumps. Adults born in 1957 or later should have 1 or more doses of MMR vaccine unless there is a contraindication to the vaccine or there is laboratory evidence of immunity to   each of the three diseases. A routine second dose of MMR vaccine should be obtained at least 28 days after the first dose for students attending postsecondary schools, health care workers, or international travelers. People who received inactivated measles vaccine or an unknown type of measles vaccine during 1963-1967 should receive 2 doses of MMR vaccine. People who received inactivated mumps vaccine or an unknown type of mumps vaccine before 1979 and are at high risk for mumps infection should consider immunization with 2 doses of MMR vaccine. For females of childbearing age, rubella immunity should be determined. If there is no evidence of immunity, females who are not pregnant should be vaccinated. If there is no evidence of immunity, females who are pregnant should delay immunization until after pregnancy. Unvaccinated health care workers born before 1957 who lack laboratory evidence of measles, mumps, or rubella immunity or laboratory confirmation of disease should consider measles and mumps immunization with 2 doses of MMR vaccine or rubella immunization with 1 dose of MMR vaccine.  Pneumococcal 13-valent conjugate (PCV13) vaccine. When indicated, a person who is uncertain of her immunization history and has no record of immunization should receive the PCV13 vaccine. An adult aged 19 years or older who has certain medical conditions and has not been previously immunized should receive 1 dose of PCV13 vaccine. This PCV13 should be followed with a dose of pneumococcal polysaccharide (PPSV23) vaccine. The PPSV23 vaccine dose should be obtained at least 8 weeks after the dose of PCV13 vaccine. An adult aged 19  years or older who has certain medical conditions and previously received 1 or more doses of PPSV23 vaccine should receive 1 dose of PCV13. The PCV13 vaccine dose should be obtained 1 or more years after the last PPSV23 vaccine dose.  Pneumococcal polysaccharide (PPSV23) vaccine. When PCV13 is also indicated, PCV13 should be obtained first. All adults aged 65 years and older should be immunized. An adult younger than age 65 years who has certain medical conditions should be immunized. Any person who resides in a nursing home or long-term care facility should be immunized. An adult smoker should be immunized. People with an immunocompromised condition and certain other conditions should receive both PCV13 and PPSV23 vaccines. People with human immunodeficiency virus (HIV) infection should be immunized as soon as possible after diagnosis. Immunization during chemotherapy or radiation therapy should be avoided. Routine use of PPSV23 vaccine is not recommended for American Indians, [place] Natives, or people younger than 65 years unless there are medical conditions that require PPSV23 vaccine. When indicated, people who have unknown immunization and have no record of immunization should receive PPSV23 vaccine. One-time revaccination 5 years after the first dose of PPSV23 is recommended for people aged 19-64 years who have chronic kidney failure, nephrotic syndrome, asplenia, or immunocompromised conditions. People who received 1-2 doses of PPSV23 before age 65 years should receive another dose of PPSV23 vaccine at age 65 years or later if at least 5 years have passed since the previous dose. Doses of PPSV23 are not needed for people immunized with PPSV23 at or after age 65 years.  Meningococcal vaccine. Adults with asplenia or persistent complement component deficiencies should receive 2 doses of quadrivalent meningococcal conjugate (MenACWY-D) vaccine. The doses should be obtained at least 2 months apart.  Microbiologists working with certain meningococcal bacteria, military recruits, people at risk during an outbreak, and people who travel to or live in countries with a high rate of meningitis should be immunized. A first-year college student up through age   21 years who is living in a residence hall should receive a dose if she did not receive a dose on or after her 16th birthday. Adults who have certain high-risk conditions should receive one or more doses of vaccine.  Hepatitis A vaccine. Adults who wish to be protected from this disease, have certain high-risk conditions, work with hepatitis A-infected animals, work in hepatitis A research labs, or travel to or work in countries with a high rate of hepatitis A should be immunized. Adults who were previously unvaccinated and who anticipate close contact with an international adoptee during the first 60 days after arrival in the [place] from a country with a high rate of hepatitis A should be immunized.  Hepatitis B vaccine. Adults who wish to be protected from this disease, have certain high-risk conditions, may be exposed to blood or other infectious body fluids, are household contacts or sex partners of hepatitis B positive people, are clients or workers in certain care facilities, or travel to or work in countries with a high rate of hepatitis B should be immunized.  Haemophilus influenzae type b (Hib) vaccine. A previously unvaccinated person with asplenia or sickle cell disease or having a scheduled splenectomy should receive 1 dose of Hib vaccine. Regardless of previous immunization, a recipient of a hematopoietic stem cell transplant should receive a 3-dose series 6-12 months after her successful transplant. Hib vaccine is not recommended for adults with HIV infection. Preventive Services / Frequency Ages 64 to 68 years  Blood pressure check.** / Every 1 to 2 years.  Lipid and cholesterol check.** / Every 5 years beginning at age  22.  Clinical breast exam.** / Every 3 years for women in their 88s and 53s.  BRCA-related cancer risk assessment.** / For women who have family members with a BRCA-related cancer (breast, ovarian, tubal, or peritoneal cancers).  Pap test.** / Every 2 years from ages 90 through 51. Every 3 years starting at age 21 through age 56 or 3 with a history of 3 consecutive normal Pap tests.  HPV screening.** / Every 3 years from ages 24 through ages 1 to 46 with a history of 3 consecutive normal Pap tests.  Hepatitis C blood test.** / For any individual with known risks for hepatitis C.  Skin self-exam. / Monthly.  Influenza vaccine. / Every year.  Tetanus, diphtheria, and acellular pertussis (Tdap, Td) vaccine.** / Consult your health care provider. Pregnant women should receive 1 dose of Tdap vaccine during each pregnancy. 1 dose of Td every 10 years.  Varicella vaccine.** / Consult your health care provider. Pregnant females who do not have evidence of immunity should receive the first dose after pregnancy.  HPV vaccine. / 3 doses over 6 months, if 72 and younger. The vaccine is not recommended for use in pregnant females. However, pregnancy testing is not needed before receiving a dose.  Measles, mumps, rubella (MMR) vaccine.** / You need at least 1 dose of MMR if you were born in 1957 or later. You may also need a 2nd dose. For females of childbearing age, rubella immunity should be determined. If there is no evidence of immunity, females who are not pregnant should be vaccinated. If there is no evidence of immunity, females who are pregnant should delay immunization until after pregnancy.  Pneumococcal 13-valent conjugate (PCV13) vaccine.** / Consult your health care provider.  Pneumococcal polysaccharide (PPSV23) vaccine.** / 1 to 2 doses if you smoke cigarettes or if you have certain conditions.  Meningococcal vaccine.** /  1 dose if you are age 19 to 21 years and a first-year college  student living in a residence hall, or have one of several medical conditions, you need to get vaccinated against meningococcal disease. You may also need additional booster doses.  Hepatitis A vaccine.** / Consult your health care provider.  Hepatitis B vaccine.** / Consult your health care provider.  Haemophilus influenzae type b (Hib) vaccine.** / Consult your health care provider. Ages 40 to 64 years  Blood pressure check.** / Every 1 to 2 years.  Lipid and cholesterol check.** / Every 5 years beginning at age 20 years.  Lung cancer screening. / Every year if you are aged 55-80 years and have a 30-pack-year history of smoking and currently smoke or have quit within the past 15 years. Yearly screening is stopped once you have quit smoking for at least 15 years or develop a health problem that would prevent you from having lung cancer treatment.  Clinical breast exam.** / Every year after age 40 years.  BRCA-related cancer risk assessment.** / For women who have family members with a BRCA-related cancer (breast, ovarian, tubal, or peritoneal cancers).  Mammogram.** / Every year beginning at age 40 years and continuing for as long as you are in good health. Consult with your health care provider.  Pap test.** / Every 3 years starting at age 30 years through age 65 or 70 years with a history of 3 consecutive normal Pap tests.  HPV screening.** / Every 3 years from ages 30 years through ages 65 to 70 years with a history of 3 consecutive normal Pap tests.  Fecal occult blood test (FOBT) of stool. / Every year beginning at age 50 years and continuing until age 75 years. You may not need to do this test if you get a colonoscopy every 10 years.  Flexible sigmoidoscopy or colonoscopy.** / Every 5 years for a flexible sigmoidoscopy or every 10 years for a colonoscopy beginning at age 50 years and continuing until age 75 years.  Hepatitis C blood test.** / For all people born from 1945 through  1965 and any individual with known risks for hepatitis C.  Skin self-exam. / Monthly.  Influenza vaccine. / Every year.  Tetanus, diphtheria, and acellular pertussis (Tdap/Td) vaccine.** / Consult your health care provider. Pregnant women should receive 1 dose of Tdap vaccine during each pregnancy. 1 dose of Td every 10 years.  Varicella vaccine.** / Consult your health care provider. Pregnant females who do not have evidence of immunity should receive the first dose after pregnancy.  Zoster vaccine.** / 1 dose for adults aged 60 years or older.  Measles, mumps, rubella (MMR) vaccine.** / You need at least 1 dose of MMR if you were born in 1957 or later. You may also need a 2nd dose. For females of childbearing age, rubella immunity should be determined. If there is no evidence of immunity, females who are not pregnant should be vaccinated. If there is no evidence of immunity, females who are pregnant should delay immunization until after pregnancy.  Pneumococcal 13-valent conjugate (PCV13) vaccine.** / Consult your health care provider.  Pneumococcal polysaccharide (PPSV23) vaccine.** / 1 to 2 doses if you smoke cigarettes or if you have certain conditions.  Meningococcal vaccine.** / Consult your health care provider.  Hepatitis A vaccine.** / Consult your health care provider.  Hepatitis B vaccine.** / Consult your health care provider.  Haemophilus influenzae type b (Hib) vaccine.** / Consult your health care provider. Ages 65   years and over  Blood pressure check.** / Every 1 to 2 years.  Lipid and cholesterol check.** / Every 5 years beginning at age 22 years.  Lung cancer screening. / Every year if you are aged 73-80 years and have a 30-pack-year history of smoking and currently smoke or have quit within the past 15 years. Yearly screening is stopped once you have quit smoking for at least 15 years or develop a health problem that would prevent you from having lung cancer  treatment.  Clinical breast exam.** / Every year after age 4 years.  BRCA-related cancer risk assessment.** / For women who have family members with a BRCA-related cancer (breast, ovarian, tubal, or peritoneal cancers).  Mammogram.** / Every year beginning at age 40 years and continuing for as long as you are in good health. Consult with your health care provider.  Pap test.** / Every 3 years starting at age 9 years through age 34 or 91 years with 3 consecutive normal Pap tests. Testing can be stopped between 65 and 70 years with 3 consecutive normal Pap tests and no abnormal Pap or HPV tests in the past 10 years.  HPV screening.** / Every 3 years from ages 57 years through ages 64 or 45 years with a history of 3 consecutive normal Pap tests. Testing can be stopped between 65 and 70 years with 3 consecutive normal Pap tests and no abnormal Pap or HPV tests in the past 10 years.  Fecal occult blood test (FOBT) of stool. / Every year beginning at age 15 years and continuing until age 17 years. You may not need to do this test if you get a colonoscopy every 10 years.  Flexible sigmoidoscopy or colonoscopy.** / Every 5 years for a flexible sigmoidoscopy or every 10 years for a colonoscopy beginning at age 86 years and continuing until age 71 years.  Hepatitis C blood test.** / For all people born from [birth year] through 1964 and any individual with known risks for hepatitis C.  Osteoporosis screening.** / A one-time screening for women ages 83 years and over and women at risk for fractures or osteoporosis.  Skin self-exam. / Monthly.  Influenza vaccine. / Every year.  Tetanus, diphtheria, and acellular pertussis (Tdap/Td) vaccine.** / 1 dose of Td every 10 years.  Varicella vaccine.** / Consult your health care provider.  Zoster vaccine.** / 1 dose for adults aged 61 years or older.  Pneumococcal 13-valent conjugate (PCV13) vaccine.** / Consult your health care provider.  Pneumococcal  polysaccharide (PPSV23) vaccine.** / 1 dose for all adults aged 28 years and older.  Meningococcal vaccine.** / Consult your health care provider.  Hepatitis A vaccine.** / Consult your health care provider.  Hepatitis B vaccine.** / Consult your health care provider.  Haemophilus influenzae type b (Hib) vaccine.** / Consult your health care provider. ** Family history and personal history of risk and conditions may change your health care provider's recommendations. Document Released: 03/12/2001 Document Revised: 05/31/2013 Document Reviewed: 06/11/2010 [facility] Patient Information 2015 [place], [place]. This information is not intended to replace advice given to you by your health care provider. Make sure you discuss any questions you have with your health care provider.

## 2014-08-08 NOTE — Progress Notes (Signed)
Subjective:   Toni [name] is a 69 y.o. female who presents for Medicare Annual (Subsequent) preventive examination. Pt is having trouble with L hip and knee.  She had hip replacement in 1979 and it recently started bothering her a few months ago.  The L knee is also bothering her recently -- she had what sound like kenalog injections and had effusions in past.  No new injury.   Pt c/o ? Bug bite, "whelps" that come up periodically on her arms , legs , chest that itch, lasts for days and then goes away on its own.     Review of Systems:   Review of Systems  Constitutional: Negative for activity change, appetite change and fatigue.  HENT: Negative for hearing loss, congestion, tinnitus and ear discharge.   Eyes: Negative for visual disturbance (see optho q1y -- vision corrected to 20/20 with glasses).  Respiratory: Negative for cough, chest tightness and shortness of breath.   Cardiovascular: Negative for chest pain, palpitations and leg swelling.  Gastrointestinal: Negative for abdominal pain, diarrhea, constipation and abdominal distention.  Genitourinary: Negative for urgency, frequency, decreased urine volume and difficulty urinating.  Musculoskeletal: Negative for back pain, arthralgias and gait problem.  Skin: Negative for color change, pallor and rash.  Neurological: Negative for dizziness, light-headedness, numbness and headaches.  Hematological: Negative for adenopathy. Does not bruise/bleed easily.  Psychiatric/Behavioral: Negative for suicidal ideas, confusion, sleep disturbance, self-injury, dysphoric mood, decreased concentration and agitation.  Pt is able to read and write and can do all ADLs No risk for falling No abuse/ violence in home           Objective:     Vitals: BP 114/68 mmHg  Pulse 95  Temp(Src) 98.1 F (36.7 C) (Oral)  Ht 5\' 5"  (1.651 m)  Wt 141 lb 6.4 oz (64.139 kg)  BMI 23.53 kg/m2  SpO2 96% BP 114/68 mmHg  Pulse 95  Temp(Src) 98.1 F (36.7  C) (Oral)  Ht 5\' 5"  (1.651 m)  Wt 141 lb 6.4 oz (64.139 kg)  BMI 23.53 kg/m2  SpO2 96% General appearance: alert, cooperative, appears stated age and no distress Head: Normocephalic, without obvious abnormality, atraumatic Eyes: conjunctivae/corneas clear. PERRL, EOM's intact. Fundi benign. Ears: normal TM's and external ear canals both ears Nose: Nares normal. Septum midline. Mucosa normal. No drainage or sinus tenderness. Throat: lips, mucosa, and tongue normal; teeth and gums normal Neck: no adenopathy, supple, symmetrical, trachea midline and thyroid not enlarged, symmetric, no tenderness/mass/nodules Back: symmetric, no curvature. ROM normal. No CVA tenderness. Lungs: clear to auscultation bilaterally Breasts: normal appearance, no masses or tenderness Heart: S1, S2 normal Abdomen: soft, non-tender; bowel sounds normal; no masses,  no organomegaly Pelvic: cervix normal in appearance, external genitalia normal, no adnexal masses or tenderness, no cervical motion tenderness, rectovaginal septum normal, uterus normal size, shape, and consistency, vagina normal without discharge and pap done, rectal heme neg brown stool Extremities: extremities normal, atraumatic, no cyanosis or edema Pulses: 2+ and symmetric Skin: Skin color, texture, turgor normal. No rashes or lesions Lymph nodes: Cervical, supraclavicular, and axillary nodes normal. Neurologic: Alert and oriented X 3, normal strength and tone. Normal symmetric reflexes. Normal coordination and gait Psych-- no depression, no anxiety Tobacco History  Smoking status  . Former Smoker  . Quit date: 06/18/2011  Smokeless tobacco  . Never Used    Comment: marijuana     Counseling given: Not Answered   Past Medical History  Diagnosis Date  . COPD (chronic obstructive  pulmonary disease)   . Depression   . Diverticulitis   . Hyperlipidemia   . Osteoarthritis   . Hypertension   . Bipolar disorder    Past Surgical History    Procedure Laterality Date  . Tonsillectomy and adenoidectomy    . Total hip arthroplasty      1979 and 1986  . Femur fracture surgery  09/2009  . Joint replacement  09/2009    R hip,  redone, 06/14/2010 [place],---  done secondary to fractured fractured   Family History  Problem Relation Age of Onset  . Stroke Mother   . Arthritis      mother family  . Depression      mother fanily  . Alcohol abuse      Both sides  . Cancer      mothers side  . Colon cancer Neg Hx    History  Sexual Activity  . Sexual Activity:  . Partners: Male    Outpatient Encounter Prescriptions as of 08/08/2014  Medication Sig  . ALPRAZolam (XANAX) 1 MG tablet TAKE 1/2 TABLET BY MOUTH THREE TIMES DAILY AS NEEDED  . Ascorbic Acid (VITAMIN C PO) Take by mouth.  . B-COMPLEX CAPS Take 1 capsule by mouth daily.  . Biotin 1000 MCG tablet Take 1,000 mcg by mouth daily.  . Calcium Carbonate-Vit D-Min 1200-1000 MG-UNIT CHEW Chew 1 capsule by mouth daily.  . cholecalciferol (VITAMIN D) 1000 UNITS tablet Take 1,000 Units by mouth 3 (three) times daily.  . cyanocobalamin 1000 MCG tablet Take 100 mcg by mouth daily.  . Flaxseed, Linseed, (FLAX SEED OIL PO) Take 1 capsule by mouth daily.  Marland Kitchen gabapentin (NEURONTIN) 600 MG tablet TAKE 1 TABLET BY MOUTH THREE TIMES DAILY  . ibuprofen (ADVIL,MOTRIN) 600 MG tablet Take 1 tablet (600 mg total) by mouth every 6 (six) hours as needed.  . lamoTRIgine (LAMICTAL) 100 MG tablet Take 1 tablet (100 mg total) by mouth 2 (two) times daily.  . Magnesium Citrate 100 MG TABS Take 1 tablet by mouth daily.  . Melatonin 10 MG CAPS Take 1 capsule by mouth daily.  . Multiple Vitamin (MULTIVITAMIN) tablet Take 1 tablet by mouth daily.  . nortriptyline (PAMELOR) 50 MG capsule   . temazepam (RESTORIL) 15 MG capsule Take 30 mg by mouth.   Marland Kitchen tiZANidine (ZANAFLEX) 4 MG tablet TAKE 1-2 TABLETS BY MOUTH EVERY 6 HOURS AS NEEDED  . Valerian 450 MG CAPS Take 1 capsule by mouth daily.  . vitamin  E 400 UNIT capsule Take 400 Units by mouth daily.  . [DISCONTINUED] ibuprofen (ADVIL,MOTRIN) 600 MG tablet TAKE 1 TABLET BY MOUTH EVERY 6 HOURS AS NEEDED  . [DISCONTINUED] MAGNESIUM OXIDE PO Take by mouth.   No facility-administered encounter medications on file as of 08/08/2014.    Activities of Daily Living In your present state of health, do you have any difficulty performing the following activities: 08/08/2014  Hearing? N  Vision? Y  Difficulty concentrating or making decisions? Y  Walking or climbing stairs? N  Dressing or bathing? N  Doing errands, shopping? N    Patient Care Team: Rosalita Chessman, DO as PCP - General Norma Fredrickson, MD as Consulting Physician (Psychiatry) Alda Berthold, DO as Consulting Physician (Neurology) Alda Berthold, DO as Consulting Physician (Neurology)    Assessment:    cpe Exercise Activities and Dietary recommendations--- no exercise secondary to depression    Goals    None     Fall Risk Fall Risk  08/08/2014 07/22/2013 05/11/2012  Falls in the past year? No No Yes  Number falls in past yr: - - 2 or more  Risk for fall due to : - - History of fall(s)  Risk for fall due to (comments): - - Normally trips over things   Depression Screen PHQ 2/9 Scores 08/08/2014 07/22/2013 05/11/2012  PHQ - 2 Score 1 1 1      Cognitive Testing AAOx 3   MMSE 30/30  Immunization History  Administered Date(s) Administered  . Influenza Whole 11/09/2008, 11/29/2010  . Pneumococcal Polysaccharide-23 05/11/2012   Screening Tests Health Maintenance  Topic Date Due  . TETANUS/TDAP  05/18/1965  . COLONOSCOPY  05/18/1996  . PNA vac Low Risk Adult (2 of 2 - PCV13) 05/11/2013  . MAMMOGRAM  07/01/2014  . INFLUENZA VACCINE  08/29/2014  . DEXA SCAN  Completed  . ZOSTAVAX  Addressed      Plan:    see AVS During the course of the visit the patient was educated and counseled about the following appropriate screening and preventive services:   Vaccines to  include Pneumoccal, Influenza, Hepatitis B, Td, Zostavax, HCV  Electrocardiogram  Cardiovascular Disease  Colorectal cancer screening  Bone density screening  Diabetes screening  Glaucoma screening  Mammography/PAP  Nutrition counseling   Patient Instructions (the written plan) was given to the patient.  1. Low back pain without sciatica, unspecified back pain laterality  - ibuprofen (ADVIL,MOTRIN) 600 MG tablet; Take 1 tablet (600 mg total) by mouth every 6 (six) hours as needed.  Dispense: 60 tablet; Refill: 0  2. Hyperlipidemia Check labs today - Basic metabolic panel - CBC with Differential/Platelet - Hepatic function panel - Lipid panel - POCT urinalysis dipstick  3. Medicare annual wellness visit, subsequent See AVS   4. Left hip pain Check xray - DG HIPS BILAT WITH PELVIS 2V; Future  5. Left knee pain Hx effusion  - DG Knee 1-2 Views Left; Future  6. Severe depression Per psych con't lamictal and amitryptiline   Garnet Koyanagi, DO  08/08/2014

## 2014-08-08 NOTE — Addendum Note (Signed)
Addended by: Eduard Roux E on: 08/08/2014 10:58 AM   Modules accepted: Orders

## 2014-08-09 LAB — POCT URINALYSIS DIPSTICK
Bilirubin, UA: NEGATIVE
Blood, UA: NEGATIVE
GLUCOSE UA: NEGATIVE
KETONES UA: NEGATIVE
Leukocytes, UA: NEGATIVE
Nitrite, UA: NEGATIVE
PROTEIN UA: NEGATIVE
SPEC GRAV UA: 1.02
Urobilinogen, UA: 0.2
pH, UA: 6

## 2014-08-09 LAB — CYTOLOGY - PAP

## 2014-08-10 ENCOUNTER — Encounter: Payer: Self-pay | Admitting: Gastroenterology

## 2014-08-14 ENCOUNTER — Other Ambulatory Visit: Payer: Self-pay | Admitting: Family Medicine

## 2014-08-15 ENCOUNTER — Ambulatory Visit: Payer: Medicare Other | Admitting: Neurology

## 2014-08-16 ENCOUNTER — Ambulatory Visit: Payer: Medicare Other | Admitting: Neurology

## 2014-08-16 DIAGNOSIS — F3341 Major depressive disorder, recurrent, in partial remission: Secondary | ICD-10-CM | POA: Diagnosis not present

## 2014-08-17 DIAGNOSIS — F3341 Major depressive disorder, recurrent, in partial remission: Secondary | ICD-10-CM | POA: Diagnosis not present

## 2014-08-27 ENCOUNTER — Encounter: Payer: Self-pay | Admitting: Cardiology

## 2014-08-30 DIAGNOSIS — F3341 Major depressive disorder, recurrent, in partial remission: Secondary | ICD-10-CM | POA: Diagnosis not present

## 2014-08-30 DIAGNOSIS — Z79899 Other long term (current) drug therapy: Secondary | ICD-10-CM | POA: Diagnosis not present

## 2014-09-06 DIAGNOSIS — F3341 Major depressive disorder, recurrent, in partial remission: Secondary | ICD-10-CM | POA: Diagnosis not present

## 2014-09-13 DIAGNOSIS — F3341 Major depressive disorder, recurrent, in partial remission: Secondary | ICD-10-CM | POA: Diagnosis not present

## 2014-09-15 DIAGNOSIS — F3341 Major depressive disorder, recurrent, in partial remission: Secondary | ICD-10-CM | POA: Diagnosis not present

## 2014-09-27 DIAGNOSIS — F3341 Major depressive disorder, recurrent, in partial remission: Secondary | ICD-10-CM | POA: Diagnosis not present

## 2014-10-04 ENCOUNTER — Other Ambulatory Visit: Payer: Self-pay | Admitting: Family Medicine

## 2014-10-04 DIAGNOSIS — F3341 Major depressive disorder, recurrent, in partial remission: Secondary | ICD-10-CM | POA: Diagnosis not present

## 2014-10-11 DIAGNOSIS — F3341 Major depressive disorder, recurrent, in partial remission: Secondary | ICD-10-CM | POA: Diagnosis not present

## 2014-10-18 DIAGNOSIS — F3341 Major depressive disorder, recurrent, in partial remission: Secondary | ICD-10-CM | POA: Diagnosis not present

## 2014-10-19 ENCOUNTER — Ambulatory Visit: Payer: Medicare Other | Admitting: Gastroenterology

## 2014-10-25 DIAGNOSIS — F3341 Major depressive disorder, recurrent, in partial remission: Secondary | ICD-10-CM | POA: Diagnosis not present

## 2014-11-01 DIAGNOSIS — F3341 Major depressive disorder, recurrent, in partial remission: Secondary | ICD-10-CM | POA: Diagnosis not present

## 2014-11-08 ENCOUNTER — Other Ambulatory Visit: Payer: Self-pay | Admitting: Family Medicine

## 2014-11-08 DIAGNOSIS — F3341 Major depressive disorder, recurrent, in partial remission: Secondary | ICD-10-CM | POA: Diagnosis not present

## 2014-11-15 DIAGNOSIS — F3341 Major depressive disorder, recurrent, in partial remission: Secondary | ICD-10-CM | POA: Diagnosis not present

## 2014-11-22 DIAGNOSIS — F3341 Major depressive disorder, recurrent, in partial remission: Secondary | ICD-10-CM | POA: Diagnosis not present

## 2014-11-29 DIAGNOSIS — F3341 Major depressive disorder, recurrent, in partial remission: Secondary | ICD-10-CM | POA: Diagnosis not present

## 2014-12-06 DIAGNOSIS — F3341 Major depressive disorder, recurrent, in partial remission: Secondary | ICD-10-CM | POA: Diagnosis not present

## 2014-12-07 ENCOUNTER — Ambulatory Visit: Payer: Medicare Other | Admitting: Gastroenterology

## 2014-12-08 ENCOUNTER — Telehealth: Payer: Self-pay | Admitting: Family Medicine

## 2014-12-08 NOTE — Telephone Encounter (Signed)
Caller name: Self   Can be reached: [phone]   Reason for call: Patient would like to ask question about getting the Pneumonia shot

## 2014-12-08 NOTE — Telephone Encounter (Signed)
We discussed Prevnar and she will have it done at [facility]      KP

## 2014-12-13 DIAGNOSIS — F3341 Major depressive disorder, recurrent, in partial remission: Secondary | ICD-10-CM | POA: Diagnosis not present

## 2014-12-29 ENCOUNTER — Other Ambulatory Visit: Payer: Self-pay | Admitting: *Deleted

## 2014-12-29 MED ORDER — TIZANIDINE HCL 4 MG PO TABS: 4.0000 mg | ORAL_TABLET | Freq: Four times a day (QID) | ORAL | Status: DC | PRN

## 2015-01-02 ENCOUNTER — Telehealth: Payer: Self-pay | Admitting: Family Medicine

## 2015-01-02 MED ORDER — TIZANIDINE HCL 4 MG PO TABS: 4.0000 mg | ORAL_TABLET | Freq: Four times a day (QID) | ORAL | Status: DC | PRN

## 2015-01-02 NOTE — Telephone Encounter (Signed)
Medication filled to pharmacy as requested.   

## 2015-01-02 NOTE — Telephone Encounter (Signed)
Relation to [id number] Call back number:[phone]   Reason for call:  Patient is currently out of town and experiencing  back spasm  requesting a increase of tiZANidine (ZANAFLEX) 4 MG tablet or "flexril" patient states please send to  [id number] - [place], [place] - [address] [phone] (Phone) [phone] (Fax)       and she will have Rx transfer to the [facility] in [place], [place]

## 2015-01-03 ENCOUNTER — Telehealth: Payer: Self-pay

## 2015-01-03 DIAGNOSIS — M545 Low back pain: Secondary | ICD-10-CM

## 2015-01-03 MED ORDER — IBUPROFEN 600 MG PO TABS: 600.0000 mg | ORAL_TABLET | Freq: Four times a day (QID) | ORAL | Status: DC | PRN

## 2015-01-03 NOTE — Telephone Encounter (Signed)
Ibuprofen rx sent to pharmacy

## 2015-02-01 ENCOUNTER — Other Ambulatory Visit: Payer: Self-pay | Admitting: Family Medicine

## 2015-02-02 NOTE — Telephone Encounter (Signed)
Last seen 08/08/14 and filled 06/30/14 #30  Please advise    KP

## 2015-02-27 DIAGNOSIS — F3341 Major depressive disorder, recurrent, in partial remission: Secondary | ICD-10-CM | POA: Diagnosis not present

## 2015-03-14 ENCOUNTER — Telehealth: Payer: Self-pay | Admitting: Family Medicine

## 2015-03-14 NOTE — Telephone Encounter (Signed)
Did she say why she needed one? We need that information to complete the form     KP

## 2015-03-14 NOTE — Telephone Encounter (Signed)
Relation to [id number] Call back number:[phone] Pharmacy:  Reason for call:  Patient would like a handicap place card patient scheduled her follow up for 03/28/15. Please advise

## 2015-03-15 NOTE — Telephone Encounter (Signed)
LVM advising patient to follow up

## 2015-03-24 NOTE — Telephone Encounter (Signed)
Pt states that she had handicap placard before. She is requesting call from [name] to discuss. She would prefer not to come in for appt as she does not want to spend the money. I advised that she needs to visit every 6 months for medication mgmt, etc. Pt states that she feels that she doesn't need a visit, she is feeling fine and doesn't need any refills. Pt request call from [place] at [phone].

## 2015-03-27 NOTE — Telephone Encounter (Signed)
Patient would like a renewal of handicapped placard, he is having trouble walking long distance w/o assistive device. She is requesting that it be mailed.  Please advise       KP

## 2015-03-28 ENCOUNTER — Ambulatory Visit: Payer: Medicare Other | Admitting: Family Medicine

## 2015-03-28 NOTE — Telephone Encounter (Signed)
Ok to give handicap app 

## 2015-03-28 NOTE — Telephone Encounter (Signed)
Mailed    KP 

## 2015-03-29 DIAGNOSIS — F3341 Major depressive disorder, recurrent, in partial remission: Secondary | ICD-10-CM | POA: Diagnosis not present

## 2015-04-05 DIAGNOSIS — F3341 Major depressive disorder, recurrent, in partial remission: Secondary | ICD-10-CM | POA: Diagnosis not present

## 2015-04-18 DIAGNOSIS — F3341 Major depressive disorder, recurrent, in partial remission: Secondary | ICD-10-CM | POA: Diagnosis not present

## 2015-04-25 DIAGNOSIS — F3341 Major depressive disorder, recurrent, in partial remission: Secondary | ICD-10-CM | POA: Diagnosis not present

## 2015-05-02 DIAGNOSIS — F3341 Major depressive disorder, recurrent, in partial remission: Secondary | ICD-10-CM | POA: Diagnosis not present

## 2015-05-08 DIAGNOSIS — F3341 Major depressive disorder, recurrent, in partial remission: Secondary | ICD-10-CM | POA: Diagnosis not present

## 2015-05-19 DIAGNOSIS — F3341 Major depressive disorder, recurrent, in partial remission: Secondary | ICD-10-CM | POA: Diagnosis not present

## 2015-05-22 ENCOUNTER — Other Ambulatory Visit: Payer: Self-pay | Admitting: Family Medicine

## 2015-05-22 ENCOUNTER — Other Ambulatory Visit (HOSPITAL_COMMUNITY): Payer: Self-pay | Admitting: Psychiatry

## 2015-05-23 DIAGNOSIS — F3341 Major depressive disorder, recurrent, in partial remission: Secondary | ICD-10-CM | POA: Diagnosis not present

## 2015-05-30 DIAGNOSIS — F3341 Major depressive disorder, recurrent, in partial remission: Secondary | ICD-10-CM | POA: Diagnosis not present

## 2015-06-06 DIAGNOSIS — F3341 Major depressive disorder, recurrent, in partial remission: Secondary | ICD-10-CM | POA: Diagnosis not present

## 2015-06-19 ENCOUNTER — Other Ambulatory Visit: Payer: Self-pay | Admitting: Family Medicine

## 2015-06-20 NOTE — Telephone Encounter (Signed)
30 day supply of lamictal and gabapentin sent to pharmacy. Pt is past due for 6 month follow up with Dr Carollee Herter. She will need to be seen in the office before further refills can be given. Thanks!

## 2015-06-20 NOTE — Telephone Encounter (Signed)
Left message for patient informing her of the need for an OV before she can get future refills.

## 2015-06-21 NOTE — Telephone Encounter (Signed)
Pt scheduled appt for 07/03/15 with Dr Etter Sjogren.

## 2015-06-22 DIAGNOSIS — F3341 Major depressive disorder, recurrent, in partial remission: Secondary | ICD-10-CM | POA: Diagnosis not present

## 2015-07-03 ENCOUNTER — Ambulatory Visit (INDEPENDENT_AMBULATORY_CARE_PROVIDER_SITE_OTHER): Payer: Medicare Other | Admitting: Family Medicine

## 2015-07-03 ENCOUNTER — Encounter: Payer: Self-pay | Admitting: Family Medicine

## 2015-07-03 VITALS — BP 130/80 | HR 84 | Temp 98.4°F | Ht 65.0 in | Wt 137.8 lb

## 2015-07-03 DIAGNOSIS — E785 Hyperlipidemia, unspecified: Secondary | ICD-10-CM | POA: Diagnosis not present

## 2015-07-03 DIAGNOSIS — F419 Anxiety disorder, unspecified: Secondary | ICD-10-CM

## 2015-07-03 DIAGNOSIS — Z1159 Encounter for screening for other viral diseases: Secondary | ICD-10-CM | POA: Diagnosis not present

## 2015-07-03 DIAGNOSIS — M62838 Other muscle spasm: Secondary | ICD-10-CM | POA: Diagnosis not present

## 2015-07-03 DIAGNOSIS — F329 Major depressive disorder, single episode, unspecified: Secondary | ICD-10-CM

## 2015-07-03 DIAGNOSIS — H538 Other visual disturbances: Secondary | ICD-10-CM

## 2015-07-03 DIAGNOSIS — F322 Major depressive disorder, single episode, severe without psychotic features: Secondary | ICD-10-CM

## 2015-07-03 LAB — HEPATITIS C ANTIBODY: HCV Ab: NEGATIVE

## 2015-07-03 MED ORDER — TIZANIDINE HCL 4 MG PO TABS: 4.0000 mg | ORAL_TABLET | Freq: Four times a day (QID) | ORAL | Status: DC | PRN

## 2015-07-03 MED ORDER — ALPRAZOLAM 1 MG PO TABS: ORAL_TABLET | ORAL | Status: DC

## 2015-07-03 NOTE — Progress Notes (Signed)
Patient ID: Toni [name], female    DOB: [birth year]  Age: 70 y.o. MRN: [id number]    Subjective:  Subjective HPI Anonda Blanchet presents for f/u--- psych to take over psych meds  Review of Systems  Constitutional: Negative for diaphoresis, appetite change, fatigue and unexpected weight change.  Eyes: Negative for pain, redness and visual disturbance.  Respiratory: Negative for cough, chest tightness, shortness of breath and wheezing.   Cardiovascular: Negative for chest pain, palpitations and leg swelling.  Endocrine: Negative for cold intolerance, heat intolerance, polydipsia, polyphagia and polyuria.  Genitourinary: Negative for dysuria, frequency and difficulty urinating.  Neurological: Negative for dizziness, light-headedness, numbness and headaches.    History Past Medical History  Diagnosis Date  . COPD (chronic obstructive pulmonary disease) ([place])   . Depression   . Diverticulitis   . Hyperlipidemia   . Osteoarthritis   . Hypertension   . Bipolar disorder ([place])     She has past surgical history that includes Tonsillectomy and adenoidectomy; Total hip arthroplasty; Femur fracture surgery (09/2009); and Joint replacement (09/2009).   Her family history includes Stroke in her mother. There is no history of Colon cancer.She reports that she quit smoking about 4 years ago. She has never used smokeless tobacco. She reports that she drinks about 1.2 oz of alcohol per week. She reports that she uses illicit drugs (Marijuana) about 14 times per week.  Current Outpatient Prescriptions on File Prior to Visit  Medication Sig Dispense Refill  . Ascorbic Acid (VITAMIN C PO) Take by mouth.    . B-COMPLEX CAPS Take 1 capsule by mouth daily.    . Biotin 1000 MCG tablet Take 1,000 mcg by mouth daily.    . cholecalciferol (VITAMIN D) 1000 UNITS tablet Take 1,000 Units by mouth 3 (three) times daily.    . cyanocobalamin 1000 MCG tablet Take 100 mcg by mouth daily.    . Flaxseed, Linseed,  (FLAX SEED OIL PO) Take 1 capsule by mouth daily.    Marland Kitchen gabapentin (NEURONTIN) 600 MG tablet TAKE 1 TABLET BY MOUTH THREE TIMES DAILY 90 tablet 0  . ibuprofen (ADVIL,MOTRIN) 600 MG tablet Take 1 tablet (600 mg total) by mouth every 6 (six) hours as needed. 60 tablet 0  . lamoTRIgine (LAMICTAL) 100 MG tablet TAKE 1 TABLET BY MOUTH TWICE DAILY 60 tablet 0  . Magnesium Citrate 100 MG TABS Take 1 tablet by mouth daily.    . Melatonin 10 MG CAPS Take 1 capsule by mouth daily.    . Multiple Vitamin (MULTIVITAMIN) tablet Take 1 tablet by mouth daily.    . nortriptyline (PAMELOR) 50 MG capsule   0  . temazepam (RESTORIL) 15 MG capsule Take 30 mg by mouth.   1  . Valerian 450 MG CAPS Take 1 capsule by mouth daily.    . vitamin E 400 UNIT capsule Take 400 Units by mouth daily.     No current facility-administered medications on file prior to visit.     Objective:  Objective Physical Exam  Constitutional: She is oriented to person, place, and time. She appears well-developed and well-nourished.  HENT:  Head: Normocephalic and atraumatic.  Eyes: Conjunctivae and EOM are normal.  Neck: Normal range of motion. Neck supple. No JVD present. Carotid bruit is not present. No thyromegaly present.  Cardiovascular: Normal rate, regular rhythm and normal heart sounds.   No murmur heard. Pulmonary/Chest: Effort normal and breath sounds normal. No respiratory distress. She has no wheezes. She has no rales. She exhibits no  tenderness.  Musculoskeletal: She exhibits no edema.  Neurological: She is alert and oriented to person, place, and time.  Psychiatric: She has a normal mood and affect.  Nursing note and vitals reviewed.  BP 130/80 mmHg  Pulse 84  Temp(Src) 98.4 F (36.9 C) (Oral)  Ht 5\' 5"  (1.651 m)  Wt 137 lb 12.8 oz (62.506 kg)  BMI 22.93 kg/m2  SpO2 96% Wt Readings from Last 3 Encounters:  07/03/15 137 lb 12.8 oz (62.506 kg)  08/08/14 141 lb 6.4 oz (64.139 kg)  04/28/14 147 lb 4 oz (66.792 kg)      Lab Results  Component Value Date   WBC 8.6 08/08/2014   HGB 14.5 08/08/2014   HCT 42.5 08/08/2014   PLT 311.0 08/08/2014   GLUCOSE 105* 08/08/2014   CHOL 259* 08/08/2014   TRIG 144.0 08/08/2014   HDL 65.40 08/08/2014   LDLDIRECT 162.3 05/11/2012   LDLCALC 165* 08/08/2014   ALT 10 08/08/2014   AST 18 08/08/2014   NA 132* 08/08/2014   K 4.0 08/08/2014   CL 95* 08/08/2014   CREATININE 0.80 08/08/2014   BUN 9 08/08/2014   CO2 30 08/08/2014   TSH 1.22 12/17/2012   INR 1.05 09/30/2009   HGBA1C 5.7 04/14/2007    Dg Knee 1-2 Views Left  08/08/2014  CLINICAL DATA:  Chronic left hip pain and left knee pain EXAM: LEFT KNEE - 1-2 VIEW COMPARISON:  None. FINDINGS: Two views of the left knee submitted. No acute fracture or subluxation. Significant chondrocalcinosis. Mild narrowing of patellofemoral joint space. Tiny joint effusion. IMPRESSION: No acute fracture or subluxation. Significant chondrocalcinosis. Mild degenerative changes. Electronically Signed   By: Lahoma Crocker M.D.   On: 08/08/2014 11:17   Dg Hips Bilat With Pelvis 2v  08/08/2014  CLINICAL DATA:  Left hip pain. Knee pain. No known injury. Initial evaluation. EXAM: DG HIP (WITH OR WITHOUT PELVIS) 2V BILAT COMPARISON:  09/30/2009. FINDINGS: Diffuse osteopenia.Degenerative changes noted lumbar spine. Bilateral hip replacements are unchanged from prior exam of 09/30/2009. Hardware intact. No acute abnormality. Pelvic calcifications consistent phleboliths. IMPRESSION: 1.  Bilateral hip replacements appears stable from 09/30/2009. 2.  No acute abnormality identified. Electronically Signed   By: [place]   On: 08/08/2014 11:19     Assessment & Plan:  Plan I have discontinued Ms. Eduardo's Calcium Carbonate-Vit D-Min. I have also changed her ALPRAZolam. Additionally, I am having her maintain her multivitamin, cholecalciferol, cyanocobalamin, Biotin, B-Complex, Ascorbic Acid (VITAMIN C PO), nortriptyline, temazepam, (Flaxseed,  Linseed, (FLAX SEED OIL PO)), vitamin E, Valerian, Melatonin, Magnesium Citrate, ibuprofen, gabapentin, lamoTRIgine, Calcium Citrate (CITRACAL PO), Glucosamine-Chondroitin (GLUCOSAMINE CHONDR COMPLEX PO), and tiZANidine.  Meds ordered this encounter  Medications  . Calcium Citrate (CITRACAL PO)    Sig: Take 1 tablet by mouth daily.  . Glucosamine-Chondroitin (GLUCOSAMINE CHONDR COMPLEX PO)    Sig: Take 2 g by mouth daily.  Marland Kitchen tiZANidine (ZANAFLEX) 4 MG tablet    Sig: Take 1-2 tablets (4-8 mg total) by mouth every 6 (six) hours as needed.    Dispense:  30 tablet    Refill:  0  . ALPRAZolam (XANAX) 1 MG tablet    Sig: TAKE 1/2 TABLET BY MOUTH THREE TIMES DAILY AS NEEDED--- dr Casimiro Needle to take over    Dispense:  30 tablet    Refill:  0    Problem List Items Addressed This Visit    Severe depression    Per psych--- psych to take over all psych meds  Relevant Medications   ALPRAZolam (XANAX) 1 MG tablet    Other Visit Diagnoses    Anxiety disorder, unspecified anxiety disorder type    -  Primary    Relevant Medications    ALPRAZolam (XANAX) 1 MG tablet    Muscle spasm        Relevant Medications    tiZANidine (ZANAFLEX) 4 MG tablet    Hyperlipidemia LDL goal <100        Relevant Orders    Lipid panel    Comprehensive metabolic panel    Need for hepatitis C screening test        Relevant Orders    Hepatitis C antibody (Completed)    Blurry vision, bilateral        Relevant Orders    Ambulatory referral to Ophthalmology       Follow-up: Return in about 6 months (around 01/02/2016), or if symptoms worsen or fail to improve, for annual exam, fasting.  Ann Held, DO

## 2015-07-03 NOTE — Assessment & Plan Note (Signed)
Per psych--- psych to take over all psych meds

## 2015-07-03 NOTE — Progress Notes (Signed)
Pre visit review using our clinic review tool, if applicable. No additional management support is needed unless otherwise documented below in the visit note. 

## 2015-07-03 NOTE — Patient Instructions (Signed)
Insomnia Insomnia is a sleep disorder that makes it difficult to fall asleep or to stay asleep. Insomnia can cause tiredness (fatigue), low energy, difficulty concentrating, mood swings, and poor performance at work or school.  There are three different ways to classify insomnia:  Difficulty falling asleep.  Difficulty staying asleep.  Waking up too early in the morning. Any type of insomnia can be long-term (chronic) or short-term (acute). Both are common. Short-term insomnia usually lasts for three months or less. Chronic insomnia occurs at least three times a week for longer than three months. CAUSES  Insomnia may be caused by another condition, situation, or substance, such as:  Anxiety.  Certain medicines.  Gastroesophageal reflux disease (GERD) or other gastrointestinal conditions.  Asthma or other breathing conditions.  Restless legs syndrome, sleep apnea, or other sleep disorders.  Chronic pain.  Menopause. This may include hot flashes.  Stroke.  Abuse of alcohol, tobacco, or illegal drugs.  Depression.  Caffeine.   Neurological disorders, such as Alzheimer disease.  An overactive thyroid (hyperthyroidism). The cause of insomnia may not be known. RISK FACTORS Risk factors for insomnia include:  Gender. Women are more commonly affected than men.  Age. Insomnia is more common as you get older.  Stress. This may involve your professional or personal life.  Income. Insomnia is more common in people with lower income.  Lack of exercise.   Irregular work schedule or night shifts.  Traveling between different time zones. SIGNS AND SYMPTOMS If you have insomnia, trouble falling asleep or trouble staying asleep is the main symptom. This may lead to other symptoms, such as:  Feeling fatigued.  Feeling nervous about going to sleep.  Not feeling rested in the morning.  Having trouble concentrating.  Feeling irritable, anxious, or depressed. TREATMENT   Treatment for insomnia depends on the cause. If your insomnia is caused by an underlying condition, treatment will focus on addressing the condition. Treatment may also include:   Medicines to help you sleep.  Counseling or therapy.  Lifestyle adjustments. HOME CARE INSTRUCTIONS   Take medicines only as directed by your health care provider.  Keep regular sleeping and waking hours. Avoid naps.  Keep a sleep diary to help you and your health care provider figure out what could be causing your insomnia. Include:   When you sleep.  When you wake up during the night.  How well you sleep.   How rested you feel the next day.  Any side effects of medicines you are taking.  What you eat and drink.   Make your bedroom a comfortable place where it is easy to fall asleep:  Put up shades or special blackout curtains to block light from outside.  Use a white noise machine to block noise.  Keep the temperature cool.   Exercise regularly as directed by your health care provider. Avoid exercising right before bedtime.  Use relaxation techniques to manage stress. Ask your health care provider to suggest some techniques that may work well for you. These may include:  Breathing exercises.  Routines to release muscle tension.  Visualizing peaceful scenes.  Cut back on alcohol, caffeinated beverages, and cigarettes, especially close to bedtime. These can disrupt your sleep.  Do not overeat or eat spicy foods right before bedtime. This can lead to digestive discomfort that can make it hard for you to sleep.  Limit screen use before bedtime. This includes:  Watching TV.  Using your smartphone, tablet, and computer.  Stick to a routine. This   can help you fall asleep faster. Try to do a quiet activity, brush your teeth, and go to bed at the same time each night.  Get out of bed if you are still awake after 15 minutes of trying to sleep. Keep the lights down, but try reading or  doing a quiet activity. When you feel sleepy, go back to bed.  Make sure that you drive carefully. Avoid driving if you feel very sleepy.  Keep all follow-up appointments as directed by your health care provider. This is important. SEEK MEDICAL CARE IF:   You are tired throughout the day or have trouble in your daily routine due to sleepiness.  You continue to have sleep problems or your sleep problems get worse. SEEK IMMEDIATE MEDICAL CARE IF:   You have serious thoughts about hurting yourself or someone else.   This information is not intended to replace advice given to you by your health care provider. Make sure you discuss any questions you have with your health care provider.   Document Released: 01/12/2000 Document Revised: 10/05/2014 Document Reviewed: 10/15/2013 Elsevier Interactive Patient Education 2016 Elsevier Inc.  

## 2015-07-04 LAB — LIPID PANEL
CHOL/HDL RATIO: 4
Cholesterol: 251 mg/dL — ABNORMAL HIGH (ref 0–200)
HDL: 67.4 mg/dL (ref >39.00)
LDL Cholesterol: 167 mg/dL — ABNORMAL HIGH (ref 0–99)
NonHDL: 183.63
TRIGLYCERIDES: 83 mg/dL (ref 0.0–149.0)
VLDL: 16.6 mg/dL (ref 0.0–40.0)

## 2015-07-04 LAB — COMPREHENSIVE METABOLIC PANEL
ALK PHOS: 62 U/L (ref 39–117)
ALT: 12 U/L (ref 0–35)
AST: 20 U/L (ref 0–37)
Albumin: 4.8 g/dL (ref 3.5–5.2)
BILIRUBIN TOTAL: 0.5 mg/dL (ref 0.2–1.2)
BUN: 19 mg/dL (ref 6–23)
CALCIUM: 9.8 mg/dL (ref 8.4–10.5)
CO2: 28 mEq/L (ref 19–32)
Chloride: 98 mEq/L (ref 96–112)
Creatinine, Ser: 0.8 mg/dL (ref 0.40–1.20)
GFR: 75.56 mL/min (ref >60.00)
Glucose, Bld: 99 mg/dL (ref 70–99)
POTASSIUM: 5.1 meq/L (ref 3.5–5.1)
Sodium: 133 mEq/L — ABNORMAL LOW (ref 135–145)
TOTAL PROTEIN: 7.5 g/dL (ref 6.0–8.3)

## 2015-07-12 ENCOUNTER — Other Ambulatory Visit: Payer: Self-pay | Admitting: Family Medicine

## 2015-07-13 ENCOUNTER — Other Ambulatory Visit: Payer: Self-pay | Admitting: Family Medicine

## 2015-07-13 DIAGNOSIS — E785 Hyperlipidemia, unspecified: Secondary | ICD-10-CM

## 2015-07-13 DIAGNOSIS — F3341 Major depressive disorder, recurrent, in partial remission: Secondary | ICD-10-CM | POA: Diagnosis not present

## 2015-07-14 ENCOUNTER — Encounter: Payer: Self-pay | Admitting: Gastroenterology

## 2015-07-19 DIAGNOSIS — H524 Presbyopia: Secondary | ICD-10-CM | POA: Diagnosis not present

## 2015-07-19 DIAGNOSIS — H2513 Age-related nuclear cataract, bilateral: Secondary | ICD-10-CM | POA: Diagnosis not present

## 2015-07-19 DIAGNOSIS — H5203 Hypermetropia, bilateral: Secondary | ICD-10-CM | POA: Diagnosis not present

## 2015-07-25 ENCOUNTER — Other Ambulatory Visit: Payer: Self-pay | Admitting: Family Medicine

## 2015-07-27 DIAGNOSIS — F3341 Major depressive disorder, recurrent, in partial remission: Secondary | ICD-10-CM | POA: Diagnosis not present

## 2015-08-04 DIAGNOSIS — F3341 Major depressive disorder, recurrent, in partial remission: Secondary | ICD-10-CM | POA: Diagnosis not present

## 2015-08-09 DIAGNOSIS — F3341 Major depressive disorder, recurrent, in partial remission: Secondary | ICD-10-CM | POA: Diagnosis not present

## 2015-08-10 ENCOUNTER — Other Ambulatory Visit: Payer: Medicare Other

## 2015-08-10 DIAGNOSIS — Z79899 Other long term (current) drug therapy: Secondary | ICD-10-CM | POA: Diagnosis not present

## 2015-08-16 DIAGNOSIS — F3341 Major depressive disorder, recurrent, in partial remission: Secondary | ICD-10-CM | POA: Diagnosis not present

## 2015-08-21 ENCOUNTER — Telehealth: Payer: Self-pay | Admitting: Family Medicine

## 2015-08-21 NOTE — Telephone Encounter (Signed)
Relation to [id number] Call back number:[phone] Pharmacy:  Reason for call:  Patient would like to discuss her gastro concerns patient didn't  want to elaborate or schedule appt before she speaks with [name]. Patient did state she cant get into see Dr. Ardis Hughs before 09/15/15. Please advise

## 2015-08-22 NOTE — Telephone Encounter (Signed)
scheduled 08/24/15 at 10:30.    KP

## 2015-08-22 NOTE — Telephone Encounter (Signed)
Spoke with patient and she has a History of diverticulitis and thinks she has been having a flare up, she is having abdominal pain and loose stools that are foamy, she said she stared eating a bland diet and the pain has subsided slightly, but still having issues with the stools. She wanted to know what she should do, she said her apt with with Dr.Jacobs is on the 18 th. Please advise.   KP

## 2015-08-22 NOTE — Telephone Encounter (Signed)
She needs an ov

## 2015-08-24 ENCOUNTER — Ambulatory Visit (INDEPENDENT_AMBULATORY_CARE_PROVIDER_SITE_OTHER): Payer: Medicare Other | Admitting: Family Medicine

## 2015-08-24 ENCOUNTER — Other Ambulatory Visit: Payer: Self-pay | Admitting: Family Medicine

## 2015-08-24 ENCOUNTER — Encounter: Payer: Self-pay | Admitting: Family Medicine

## 2015-08-24 VITALS — BP 147/84 | HR 90 | Temp 98.1°F | Resp 16 | Wt 141.6 lb

## 2015-08-24 DIAGNOSIS — R1013 Epigastric pain: Secondary | ICD-10-CM | POA: Diagnosis not present

## 2015-08-24 DIAGNOSIS — K5732 Diverticulitis of large intestine without perforation or abscess without bleeding: Secondary | ICD-10-CM | POA: Insufficient documentation

## 2015-08-24 DIAGNOSIS — M62838 Other muscle spasm: Secondary | ICD-10-CM

## 2015-08-24 LAB — CBC WITH DIFFERENTIAL/PLATELET
Basophils Absolute: 0 10*3/uL (ref 0.0–0.1)
Basophils Relative: 0.4 % (ref 0.0–3.0)
Eosinophils Absolute: 0.1 10*3/uL (ref 0.0–0.7)
Eosinophils Relative: 1.4 % (ref 0.0–5.0)
HEMATOCRIT: 39.9 % (ref 36.0–46.0)
Hemoglobin: 13.6 g/dL (ref 12.0–15.0)
LYMPHS ABS: 1.6 10*3/uL (ref 0.7–4.0)
LYMPHS PCT: 27.4 % (ref 12.0–46.0)
MCHC: 34 g/dL (ref 30.0–36.0)
MCV: 93.2 fl (ref 78.0–100.0)
MONOS PCT: 5.9 % (ref 3.0–12.0)
Monocytes Absolute: 0.3 10*3/uL (ref 0.1–1.0)
Neutro Abs: 3.8 10*3/uL (ref 1.4–7.7)
Neutrophils Relative %: 64.9 % (ref 43.0–77.0)
Platelets: 274 10*3/uL (ref 150.0–400.0)
RBC: 4.29 Mil/uL (ref 3.87–5.11)
RDW: 13.5 % (ref 11.5–15.5)
WBC: 5.9 10*3/uL (ref 4.0–10.5)

## 2015-08-24 LAB — COMPREHENSIVE METABOLIC PANEL
ALK PHOS: 57 U/L (ref 39–117)
ALT: 10 U/L (ref 0–35)
AST: 16 U/L (ref 0–37)
Albumin: 4.7 g/dL (ref 3.5–5.2)
BILIRUBIN TOTAL: 0.5 mg/dL (ref 0.2–1.2)
BUN: 10 mg/dL (ref 6–23)
CALCIUM: 10.3 mg/dL (ref 8.4–10.5)
CO2: 30 mEq/L (ref 19–32)
Chloride: 97 mEq/L (ref 96–112)
Creatinine, Ser: 0.8 mg/dL (ref 0.40–1.20)
GFR: 75.53 mL/min (ref >60.00)
GLUCOSE: 107 mg/dL — AB (ref 70–99)
POTASSIUM: 4.5 meq/L (ref 3.5–5.1)
Sodium: 132 mEq/L — ABNORMAL LOW (ref 135–145)
TOTAL PROTEIN: 7.4 g/dL (ref 6.0–8.3)

## 2015-08-24 MED ORDER — AMOXICILLIN-POT CLAVULANATE 875-125 MG PO TABS: 1.0000 | ORAL_TABLET | Freq: Two times a day (BID) | ORAL | 0 refills | Status: DC

## 2015-08-24 MED ORDER — RANITIDINE HCL 150 MG PO TABS: 150.0000 mg | ORAL_TABLET | Freq: Two times a day (BID) | ORAL | 2 refills | Status: DC

## 2015-08-24 NOTE — Progress Notes (Signed)
Pre visit review using our clinic review tool, if applicable. No additional management support is needed unless otherwise documented below in the visit note. 

## 2015-08-24 NOTE — Patient Instructions (Signed)
Diverticulitis °Diverticulitis is inflammation or infection of small pouches in your colon that form when you have a condition called diverticulosis. The pouches in your colon are called diverticula. Your colon, or large intestine, is where water is absorbed and stool is formed. °Complications of diverticulitis can include: °· Bleeding. °· Severe infection. °· Severe pain. °· Perforation of your colon. °· Obstruction of your colon. °CAUSES  °Diverticulitis is caused by bacteria. °Diverticulitis happens when stool becomes trapped in diverticula. This allows bacteria to grow in the diverticula, which can lead to inflammation and infection. °RISK FACTORS °People with diverticulosis are at risk for diverticulitis. Eating a diet that does not include enough fiber from fruits and vegetables may make diverticulitis more likely to develop. °SYMPTOMS  °Symptoms of diverticulitis may include: °· Abdominal pain and tenderness. The pain is normally located on the left side of the abdomen, but may occur in other areas. °· Fever and chills. °· Bloating. °· Cramping. °· Nausea. °· Vomiting. °· Constipation. °· Diarrhea. °· Blood in your stool. °DIAGNOSIS  °Your health care provider will ask you about your medical history and do a physical exam. You may need to have tests done because many medical conditions can cause the same symptoms as diverticulitis. Tests may include: °· Blood tests. °· Urine tests. °· Imaging tests of the abdomen, including X-rays and CT scans. °When your condition is under control, your health care provider may recommend that you have a colonoscopy. A colonoscopy can show how severe your diverticula are and whether something else is causing your symptoms. °TREATMENT  °Most cases of diverticulitis are mild and can be treated at home. Treatment may include: °· Taking over-the-counter pain medicines. °· Following a clear liquid diet. °· Taking antibiotic medicines by mouth for 7-10 days. °More severe cases may  be treated at a hospital. Treatment may include: °· Not eating or drinking. °· Taking prescription pain medicine. °· Receiving antibiotic medicines through an IV tube. °· Receiving fluids and nutrition through an IV tube. °· Surgery. °HOME CARE INSTRUCTIONS  °· Follow your health care provider's instructions carefully. °· Follow a full liquid diet or other diet as directed by your health care provider. After your symptoms improve, your health care provider may tell you to change your diet. He or she may recommend you eat a high-fiber diet. Fruits and vegetables are good sources of fiber. Fiber makes it easier to pass stool. °· Take fiber supplements or probiotics as directed by your health care provider. °· Only take medicines as directed by your health care provider. °· Keep all your follow-up appointments. °SEEK MEDICAL CARE IF:  °· Your pain does not improve. °· You have a hard time eating food. °· Your bowel movements do not return to normal. °SEEK IMMEDIATE MEDICAL CARE IF:  °· Your pain becomes worse. °· Your symptoms do not get better. °· Your symptoms suddenly get worse. °· You have a fever. °· You have repeated vomiting. °· You have bloody or black, tarry stools. °MAKE SURE YOU:  °· Understand these instructions. °· Will watch your condition. °· Will get help right away if you are not doing well or get worse. °  °This information is not intended to replace advice given to you by your health care provider. Make sure you discuss any questions you have with your health care provider. °  °Document Released: 10/24/2004 Document Revised: 01/19/2013 Document Reviewed: 12/09/2012 °Elsevier Interactive Patient Education ©2016 Elsevier Inc. ° °

## 2015-08-24 NOTE — Progress Notes (Signed)
Patient ID: Toni [name], female    DOB: [birth year]  Age: 70 y.o. MRN: [id number]    Subjective:  Subjective  HPI Toni [name] presents for abd pain that feels just like when she had a diverticulitis flare. Pt is in epigastric area and some in RLQ--- occasionally in Left.  No fever, no blood in stool.  Diarrhea comes and goes.  No abd pain right now.  She has an appointment with GI next month but wanted to be seen here since pain comes and goes.    Review of Systems  Constitutional: Negative for appetite change, diaphoresis, fatigue and unexpected weight change.  Eyes: Negative for pain, redness and visual disturbance.  Respiratory: Negative for cough, chest tightness, shortness of breath and wheezing.   Cardiovascular: Negative for chest pain, palpitations and leg swelling.  Gastrointestinal: Positive for abdominal pain, diarrhea and nausea. Negative for blood in stool, constipation, rectal pain and vomiting.  Endocrine: Negative for cold intolerance, heat intolerance, polydipsia, polyphagia and polyuria.  Genitourinary: Negative for difficulty urinating, dysuria and frequency.  Neurological: Negative for dizziness, light-headedness, numbness and headaches.    History Past Medical History:  Diagnosis Date  . Bipolar disorder ([place])   . COPD (chronic obstructive pulmonary disease) ([place])   . Depression   . Diverticulitis   . Hyperlipidemia   . Hypertension   . Osteoarthritis     She has a past surgical history that includes Tonsillectomy and adenoidectomy; Total hip arthroplasty; Femur fracture surgery (09/2009); and Joint replacement (09/2009).   Her family history includes Stroke in her mother.She reports that she quit smoking about 4 years ago. She has never used smokeless tobacco. She reports that she drinks about 1.2 oz of alcohol per week . She reports that she uses drugs, including Marijuana, about 14 times per week.  Current Outpatient Prescriptions on File Prior to Visit    Medication Sig Dispense Refill  . ALPRAZolam (XANAX) 1 MG tablet TAKE 1/2 TABLET BY MOUTH THREE TIMES DAILY AS NEEDED--- dr Casimiro Needle to take over 30 tablet 0  . Ascorbic Acid (VITAMIN C PO) Take by mouth.    . B-COMPLEX CAPS Take 1 capsule by mouth daily.    . Biotin 1000 MCG tablet Take 1,000 mcg by mouth daily.    . Calcium Citrate (CITRACAL PO) Take 1 tablet by mouth daily.    . cholecalciferol (VITAMIN D) 1000 UNITS tablet Take 1,000 Units by mouth 3 (three) times daily.    . cyanocobalamin 1000 MCG tablet Take 100 mcg by mouth daily.    . Flaxseed, Linseed, (FLAX SEED OIL PO) Take 1 capsule by mouth daily.    Marland Kitchen gabapentin (NEURONTIN) 600 MG tablet TAKE 1 TABLET BY MOUTH THREE TIMES DAILY 90 tablet 5  . Glucosamine-Chondroitin (GLUCOSAMINE CHONDR COMPLEX PO) Take 2 g by mouth daily.    Marland Kitchen ibuprofen (ADVIL,MOTRIN) 600 MG tablet Take 1 tablet (600 mg total) by mouth every 6 (six) hours as needed. 60 tablet 0  . lamoTRIgine (LAMICTAL) 100 MG tablet TAKE 1 TABLET BY MOUTH TWICE DAILY 60 tablet 5  . Magnesium Citrate 100 MG TABS Take 1 tablet by mouth daily.    . Melatonin 10 MG CAPS Take 1 capsule by mouth daily.    . Multiple Vitamin (MULTIVITAMIN) tablet Take 1 tablet by mouth daily.    . nortriptyline (PAMELOR) 50 MG capsule   0  . temazepam (RESTORIL) 15 MG capsule Take 30 mg by mouth.   1  . tiZANidine (ZANAFLEX) 4 MG  tablet Take 1-2 tablets (4-8 mg total) by mouth every 6 (six) hours as needed. 30 tablet 0  . Valerian 450 MG CAPS Take 1 capsule by mouth daily.    . vitamin E 400 UNIT capsule Take 400 Units by mouth daily.     No current facility-administered medications on file prior to visit.      Objective:  Objective  Physical Exam  Constitutional: She is oriented to person, place, and time. She appears well-developed and well-nourished.  HENT:  Head: Normocephalic and atraumatic.  Eyes: Conjunctivae and EOM are normal.  Neck: Normal range of motion. Neck supple. No JVD  present. Carotid bruit is not present. No thyromegaly present.  Cardiovascular: Normal rate, regular rhythm and normal heart sounds.   No murmur heard. Pulmonary/Chest: Effort normal and breath sounds normal. No respiratory distress. She has no wheezes. She has no rales. She exhibits no tenderness.  Abdominal: Soft. Bowel sounds are normal. She exhibits no distension and no mass. There is no splenomegaly or hepatomegaly. There is tenderness in the epigastric area. There is no rebound and no guarding.    Musculoskeletal: She exhibits no edema.  Neurological: She is alert and oriented to person, place, and time.  Nursing note and vitals reviewed.  BP (!) 147/84 (BP Location: Left Arm, Patient Position: Sitting)   Pulse 90   Temp 98.1 F (36.7 C)   Resp 16   Wt 141 lb 9.6 oz (64.2 kg)   SpO2 98%   BMI 23.56 kg/m  Wt Readings from Last 3 Encounters:  08/24/15 141 lb 9.6 oz (64.2 kg)  07/03/15 137 lb 12.8 oz (62.5 kg)  08/08/14 141 lb 6.4 oz (64.1 kg)     Lab Results  Component Value Date   WBC 8.6 08/08/2014   HGB 14.5 08/08/2014   HCT 42.5 08/08/2014   PLT 311.0 08/08/2014   GLUCOSE 99 07/03/2015   CHOL 251 (H) 07/03/2015   TRIG 83.0 07/03/2015   HDL 67.40 07/03/2015   LDLDIRECT 162.3 05/11/2012   LDLCALC 167 (H) 07/03/2015   ALT 12 07/03/2015   AST 20 07/03/2015   NA 133 (L) 07/03/2015   K 5.1 07/03/2015   CL 98 07/03/2015   CREATININE 0.80 07/03/2015   BUN 19 07/03/2015   CO2 28 07/03/2015   TSH 1.22 12/17/2012   INR 1.05 09/30/2009   HGBA1C 5.7 04/14/2007    Dg Knee 1-2 Views Left  Result Date: 08/08/2014 CLINICAL DATA:  Chronic left hip pain and left knee pain EXAM: LEFT KNEE - 1-2 VIEW COMPARISON:  None. FINDINGS: Two views of the left knee submitted. No acute fracture or subluxation. Significant chondrocalcinosis. Mild narrowing of patellofemoral joint space. Tiny joint effusion. IMPRESSION: No acute fracture or subluxation. Significant chondrocalcinosis. Mild  degenerative changes. Electronically Signed   By: Lahoma Crocker M.D.   On: 08/08/2014 11:17   Dg Hips Bilat With Pelvis 2v  Result Date: 08/08/2014 CLINICAL DATA:  Left hip pain. Knee pain. No known injury. Initial evaluation. EXAM: DG HIP (WITH OR WITHOUT PELVIS) 2V BILAT COMPARISON:  09/30/2009. FINDINGS: Diffuse osteopenia.Degenerative changes noted lumbar spine. Bilateral hip replacements are unchanged from prior exam of 09/30/2009. Hardware intact. No acute abnormality. Pelvic calcifications consistent phleboliths. IMPRESSION: 1.  Bilateral hip replacements appears stable from 09/30/2009. 2.  No acute abnormality identified. Electronically Signed   By: Gardner   On: 08/08/2014 11:19     Assessment & Plan:  Plan  I am having Ms. Lampley start on amoxicillin-clavulanate  and ranitidine. I am also having her maintain her multivitamin, cholecalciferol, cyanocobalamin, Biotin, B-Complex, Ascorbic Acid (VITAMIN C PO), nortriptyline, temazepam, (Flaxseed, Linseed, (FLAX SEED OIL PO)), vitamin E, Valerian, Melatonin, Magnesium Citrate, ibuprofen, Calcium Citrate (CITRACAL PO), Glucosamine-Chondroitin (GLUCOSAMINE CHONDR COMPLEX PO), tiZANidine, ALPRAZolam, gabapentin, and lamoTRIgine.  Meds ordered this encounter  Medications  . amoxicillin-clavulanate (AUGMENTIN) 875-125 MG tablet    Sig: Take 1 tablet by mouth 2 (two) times daily.    Dispense:  20 tablet    Refill:  0  . ranitidine (ZANTAC) 150 MG tablet    Sig: Take 1 tablet (150 mg total) by mouth 2 (two) times daily.    Dispense:  60 tablet    Refill:  2    Problem List Items Addressed This Visit      Unprioritized   Diverticulitis of colon - Primary    Pt has appointment with GI next month Check labs Augmentin If pain worsens consider CT abd-pelvis rto prn      Relevant Medications   amoxicillin-clavulanate (AUGMENTIN) 875-125 MG tablet   Other Relevant Orders   CBC with Differential/Platelet   Comprehensive metabolic  panel   Fecal occult blood, imunochemical    Other Visit Diagnoses    Abdominal pain, epigastric       Relevant Medications   ranitidine (ZANTAC) 150 MG tablet      Follow-up: Return in about 6 months (around 02/24/2016).  Ann Held, DO

## 2015-08-24 NOTE — Assessment & Plan Note (Signed)
Pt has appointment with GI next month Check labs Augmentin If pain worsens consider CT abd-pelvis rto prn

## 2015-08-24 NOTE — Telephone Encounter (Signed)
Patient was seen toady, please advise if rf is appropriate.   KP

## 2015-08-30 DIAGNOSIS — F3341 Major depressive disorder, recurrent, in partial remission: Secondary | ICD-10-CM | POA: Diagnosis not present

## 2015-09-05 DIAGNOSIS — F3341 Major depressive disorder, recurrent, in partial remission: Secondary | ICD-10-CM | POA: Diagnosis not present

## 2015-09-12 ENCOUNTER — Other Ambulatory Visit (INDEPENDENT_AMBULATORY_CARE_PROVIDER_SITE_OTHER): Payer: Medicare Other

## 2015-09-12 DIAGNOSIS — K5732 Diverticulitis of large intestine without perforation or abscess without bleeding: Secondary | ICD-10-CM | POA: Diagnosis not present

## 2015-09-13 LAB — FECAL OCCULT BLOOD, IMMUNOCHEMICAL: FECAL OCCULT BLD: NEGATIVE

## 2015-09-15 ENCOUNTER — Encounter: Payer: Self-pay | Admitting: Gastroenterology

## 2015-09-15 ENCOUNTER — Ambulatory Visit (INDEPENDENT_AMBULATORY_CARE_PROVIDER_SITE_OTHER): Payer: Medicare Other | Admitting: Gastroenterology

## 2015-09-15 VITALS — BP 124/70 | HR 80 | Ht 64.17 in | Wt 141.2 lb

## 2015-09-15 DIAGNOSIS — R195 Other fecal abnormalities: Secondary | ICD-10-CM | POA: Diagnosis not present

## 2015-09-15 DIAGNOSIS — R194 Change in bowel habit: Secondary | ICD-10-CM

## 2015-09-15 DIAGNOSIS — R1084 Generalized abdominal pain: Secondary | ICD-10-CM | POA: Diagnosis not present

## 2015-09-15 MED ORDER — NA SULFATE-K SULFATE-MG SULF 17.5-3.13-1.6 GM/177ML PO SOLN: 1.0000 | Freq: Once | ORAL | 0 refills | Status: AC

## 2015-09-15 NOTE — Progress Notes (Signed)
Review of pertinent gastrointestinal problems: 1.  Complicated diverticulitis, treated with sigmoid colectomy in 2001, sigmoid colostomy takedown November 2001. Post-surgery colonoscopy in 2002 was normal per patient. 2.  Functional abdominal discomfort, dyspepsia, bloating.  No weight loss, no anemia.  No signs of GI bleeding. Thought possibly related to excessive alcohol intake (2007).  Pt did not show for 2 month follow up from 07/2005 apt. 3. Reflux esophagitis: EGD Dr. Ardis Hughs 2010: showed ulcerative esophagitis.   HPI: This is a  verypleasant 70 yo woman  who was referred to me by Carollee Herter, Alferd Apa, *  to evaluate  Change in bowels, abdominal pain.   Chief complaint is change in bowels, generalized abd pain   Last colon cancer screening wsa with colonoscopy 2002.  Lately increased bloating and generalized abdominal discomfort. This is intermittent, mild achy sensation.  Has been going on for several months. She noted a change in her bowel movements.  Became looser, watery.  This comes and goes.  No overt blood in her stool.  Overall her weight has been stable.  She did FOBT testing last week, this was negative.  CBC, cmet last month were both normal.  One is a while she has pyrosis. She is really not very bothered by this.   Review of systems: Pertinent positive and negative review of systems were noted in the above HPI section. Complete review of systems was performed and was otherwise normal.   Past Medical History:  Diagnosis Date  . Bipolar disorder ([place])    pt denies  . COPD (chronic obstructive pulmonary disease) ([place])   . Depression   . Diverticulitis   . Fibromyalgia   . Hyperlipidemia   . Hypertension   . Osteoarthritis     Past Surgical History:  Procedure Laterality Date  . FEMUR FRACTURE SURGERY  09/2009  . JOINT REPLACEMENT Right 09/2009   R hip,  redone, 06/14/2010 [place],---  done secondary to fractured fractured x2  . TONSILLECTOMY AND  ADENOIDECTOMY    . TOTAL HIP ARTHROPLASTY Left    1979 and 1986    Current Outpatient Prescriptions  Medication Sig Dispense Refill  . ALPRAZolam (XANAX) 1 MG tablet TAKE 1/2 TABLET BY MOUTH THREE TIMES DAILY AS NEEDED--- dr Casimiro Needle to take over 30 tablet 0  . Ascorbic Acid (VITAMIN C PO) Take by mouth.    . B-COMPLEX CAPS Take 1 capsule by mouth daily.    . Biotin 1000 MCG tablet Take 1,000 mcg by mouth daily.    . Calcium Citrate (CITRACAL PO) Take 1 tablet by mouth daily.    . cholecalciferol (VITAMIN D) 1000 UNITS tablet Take 1,000 Units by mouth 3 (three) times daily.    . cyanocobalamin 1000 MCG tablet Take 100 mcg by mouth daily.    . Flaxseed, Linseed, (FLAX SEED OIL PO) Take 1 capsule by mouth daily.    Marland Kitchen gabapentin (NEURONTIN) 600 MG tablet TAKE 1 TABLET BY MOUTH THREE TIMES DAILY 90 tablet 5  . Glucosamine-Chondroitin (GLUCOSAMINE CHONDR COMPLEX PO) Take 2 g by mouth daily.    Marland Kitchen lamoTRIgine (LAMICTAL) 100 MG tablet TAKE 1 TABLET BY MOUTH TWICE DAILY 60 tablet 5  . Magnesium Citrate 100 MG TABS Take 1 tablet by mouth daily.    . Melatonin 10 MG CAPS Take 1 capsule by mouth daily.    . Multiple Vitamin (MULTIVITAMIN) tablet Take 1 tablet by mouth daily.    . nortriptyline (PAMELOR) 50 MG capsule   0  . Selenium (  SELENIMIN PO) Take 1 tablet by mouth daily.    . temazepam (RESTORIL) 15 MG capsule Take 30 mg by mouth.   1  . tiZANidine (ZANAFLEX) 4 MG tablet TAKE 1 TO 2 TABLETS(4 TO 8 MG) BY MOUTH EVERY 6 HOURS AS NEEDED 30 tablet 0  . Valerian 450 MG CAPS Take 1 capsule by mouth daily.    . vitamin E 400 UNIT capsule Take 400 Units by mouth daily.    Marland Kitchen ibuprofen (ADVIL,MOTRIN) 600 MG tablet Take 1 tablet (600 mg total) by mouth every 6 (six) hours as needed. (Patient not taking: Reported on 09/15/2015) 60 tablet 0  . ranitidine (ZANTAC) 150 MG tablet Take 1 tablet (150 mg total) by mouth 2 (two) times daily. (Patient not taking: Reported on 09/15/2015) 60 tablet 2   No current  facility-administered medications for this visit.     Allergies as of 09/15/2015 - Review Complete 09/15/2015  Allergen Reaction Noted  . Levofloxacin      Family History  Problem Relation Age of Onset  . Stroke Mother   . Arthritis      mother family  . Depression      mother fanily  . Alcohol abuse      Both sides  . Cancer      mothers side  . Stroke Father   . Achalasia Father   . Colon cancer Neg Hx     Social History   Social History  . Marital status: Single    Spouse name: N/A  . Number of children: 1  . Years of education: N/A   Occupational History  . retired    Social History Main Topics  . Smoking status: Former Smoker    Quit date: 06/18/2011  . Smokeless tobacco: Never Used     Comment: marijuana  . Alcohol use Yes     Comment: 2-3 bottles of wine/week  . Drug use:     Frequency: 14.0 times per week    Types: Marijuana     Comment: regular use  . Sexual activity: Not Currently    Partners: Male   Other Topics Concern  . Not on file   Social History Narrative   Lives alone in a one story home.  Retired from different jobs.  Education: college.  Has one child.      Physical Exam: Ht 5' 4.17" (1.63 m)   Wt 141 lb 4 oz (64.1 kg)   BMI 24.11 kg/m  Constitutional: generally well-appearing Psychiatric: alert and oriented x3 Eyes: extraocular movements intact Mouth: oral pharynx moist, no lesions Neck: supple no lymphadenopathy Cardiovascular: heart regular rate and rhythm Lungs: clear to auscultation bilaterally Abdomen: soft, nontender, nondistended, no obvious ascites, no peritoneal signs, normal bowel sounds Extremities: no lower extremity edema bilaterally Skin: no lesions on visible extremities   Assessment and plan: 70 y.o. female with  Generalized abdominal pain, change in bowels  These are probably functional changes and I recommended she try daily fiber supplements.  Will also proceed with colonoscopy at her soonest  convenience to exclude other causes (IBD, neoplasm).     Owens Loffler, MD [facility] 09/15/2015, 11:33 AM  Cc: Carollee Herter, Alferd Apa, *

## 2015-09-15 NOTE — Patient Instructions (Addendum)
You will be set up for a colonoscopy for change in bowels, loose stools, generalized abdominal pain. Start fiber supplements, once daily.

## 2015-09-18 DIAGNOSIS — F3341 Major depressive disorder, recurrent, in partial remission: Secondary | ICD-10-CM | POA: Diagnosis not present

## 2015-09-27 DIAGNOSIS — F3341 Major depressive disorder, recurrent, in partial remission: Secondary | ICD-10-CM | POA: Diagnosis not present

## 2015-10-06 ENCOUNTER — Ambulatory Visit (AMBULATORY_SURGERY_CENTER): Payer: Medicare Other | Admitting: Gastroenterology

## 2015-10-06 ENCOUNTER — Encounter: Payer: Self-pay | Admitting: Gastroenterology

## 2015-10-06 VITALS — BP 141/86 | HR 77 | Temp 97.1°F | Resp 14 | Ht 64.0 in | Wt 141.0 lb

## 2015-10-06 DIAGNOSIS — K5730 Diverticulosis of large intestine without perforation or abscess without bleeding: Secondary | ICD-10-CM | POA: Diagnosis not present

## 2015-10-06 DIAGNOSIS — K219 Gastro-esophageal reflux disease without esophagitis: Secondary | ICD-10-CM | POA: Diagnosis not present

## 2015-10-06 DIAGNOSIS — D125 Benign neoplasm of sigmoid colon: Secondary | ICD-10-CM | POA: Diagnosis not present

## 2015-10-06 DIAGNOSIS — F341 Dysthymic disorder: Secondary | ICD-10-CM | POA: Diagnosis not present

## 2015-10-06 DIAGNOSIS — R194 Change in bowel habit: Secondary | ICD-10-CM

## 2015-10-06 DIAGNOSIS — F319 Bipolar disorder, unspecified: Secondary | ICD-10-CM | POA: Diagnosis not present

## 2015-10-06 DIAGNOSIS — K649 Unspecified hemorrhoids: Secondary | ICD-10-CM

## 2015-10-06 DIAGNOSIS — J449 Chronic obstructive pulmonary disease, unspecified: Secondary | ICD-10-CM | POA: Diagnosis not present

## 2015-10-06 MED ORDER — SODIUM CHLORIDE 0.9 % IV SOLN: 500.0000 mL | INTRAVENOUS | Status: AC

## 2015-10-06 NOTE — Progress Notes (Signed)
Called to room to assist during endoscopic procedure.  Patient ID and intended procedure confirmed with present staff. Received instructions for my participation in the procedure from the performing physician.  

## 2015-10-06 NOTE — Patient Instructions (Signed)
YOU HAD AN ENDOSCOPIC PROCEDURE TODAY AT THE  ENDOSCOPY CENTER:   Refer to the procedure report that was given to you for any specific questions about what was found during the examination.  If the procedure report does not answer your questions, please call your gastroenterologist to clarify.  If you requested that your care partner not be given the details of your procedure findings, then the procedure report has been included in a sealed envelope for you to review at your convenience later.  YOU SHOULD EXPECT: Some feelings of bloating in the abdomen. Passage of more gas than usual.  Walking can help get rid of the air that was put into your GI tract during the procedure and reduce the bloating. If you had a lower endoscopy (such as a colonoscopy or flexible sigmoidoscopy) you may notice spotting of blood in your stool or on the toilet paper. If you underwent a bowel prep for your procedure, you may not have a normal bowel movement for a few days.  Please Note:  You might notice some irritation and congestion in your nose or some drainage.  This is from the oxygen used during your procedure.  There is no need for concern and it should clear up in a day or so.  SYMPTOMS TO REPORT IMMEDIATELY:   Following lower endoscopy (colonoscopy or flexible sigmoidoscopy):  Excessive amounts of blood in the stool  Significant tenderness or worsening of abdominal pains  Swelling of the abdomen that is new, acute  Fever of 100F or higher    For urgent or emergent issues, a gastroenterologist can be reached at any hour by calling [phone].   DIET:  We do recommend a small meal at first, but then you may proceed to your regular diet.  Drink plenty of fluids but you should avoid alcoholic beverages for 24 hours.  ACTIVITY:  You should plan to take it easy for the rest of today and you should NOT DRIVE or use heavy machinery until tomorrow (because of the sedation medicines used during the test).     FOLLOW UP: Our staff will call the number listed on your records the next business day following your procedure to check on you and address any questions or concerns that you may have regarding the information given to you following your procedure. If we do not reach you, we will leave a message.  However, if you are feeling well and you are not experiencing any problems, there is no need to return our call.  We will assume that you have returned to your regular daily activities without incident.  If any biopsies were taken you will be contacted by phone or by letter within the next 1-3 weeks.  Please call us at [phone] if you have not heard about the biopsies in 3 weeks.    SIGNATURES/CONFIDENTIALITY: You and/or your care partner have signed paperwork which will be entered into your electronic medical record.  These signatures attest to the fact that that the information above on your After Visit Summary has been reviewed and is understood.  Full responsibility of the confidentiality of this discharge information lies with you and/or your care-partner.   Resume medications. Information given on polyps,diverticulosis,hemorrhoids and high fiber diet. 

## 2015-10-06 NOTE — Op Note (Signed)
[facility] Patient Name: Toni [name] Procedure Date: 10/06/2015 8:19 AM MRN: [id number] Endoscopist: Milus Banister , MD Age: 70 Referring MD:  Date of Birth: [birth year] Gender: Female Account #: [id number] Procedure:                Colonoscopy Indications:              Change in bowel habits, last colonoscopy >10 years                            ago Medicines:                Monitored Anesthesia Care Procedure:                Pre-Anesthesia Assessment:                           - Prior to the procedure, a History and Physical                            was performed, and patient medications and                            allergies were reviewed. The patient's tolerance of                            previous anesthesia was also reviewed. The risks                            and benefits of the procedure and the sedation                            options and risks were discussed with the patient.                            All questions were answered, and informed consent                            was obtained. Prior Anticoagulants: The patient has                            taken no previous anticoagulant or antiplatelet                            agents. ASA Grade Assessment: II - A patient with                            mild systemic disease. After reviewing the risks                            and benefits, the patient was deemed in                            satisfactory condition to undergo the procedure.  After obtaining informed consent, the colonoscope                            was passed under direct vision. Throughout the                            procedure, the patient's blood pressure, pulse, and                            oxygen saturations were monitored continuously. The                            Model CF-HQ190L [id number]) scope was introduced                            through the anus and advanced to the the cecum,                       identified by appendiceal orifice and ileocecal                            valve. The colonoscopy was performed without                            difficulty. The patient tolerated the procedure                            well. The quality of the bowel preparation was                            good. The ileocecal valve, appendiceal orifice, and                            rectum were photographed. Scope In: 8:24:12 AM Scope Out: 8:37:39 AM Scope Withdrawal Time: 0 hours 9 minutes 14 seconds  Total Procedure Duration: 0 hours 13 minutes 27 seconds  Findings:                 A 3 mm polyp was found in the sigmoid colon. The                            polyp was sessile. The polyp was removed with a                            cold snare. Resection and retrieval were complete.                           Multiple small and large-mouthed diverticula were                            found in the entire colon.                           External and internal hemorrhoids were found. The  hemorrhoids were small.                           The exam was otherwise without abnormality on                            direct and retroflexion views. Complications:            No immediate complications. Estimated blood loss:                            None. Estimated Blood Loss:     Estimated blood loss: none. Impression:               - One 3 mm polyp in the sigmoid colon, removed with                            a cold snare. Resected and retrieved.                           - Diverticulosis in the entire examined colon.                           - External and internal hemorrhoids.                           - The examination was otherwise normal on direct                            and retroflexion views. Recommendation:           - Patient has a contact number available for                            emergencies. The signs and symptoms of potential                             delayed complications were discussed with the                            patient. Return to normal activities tomorrow.                            Written discharge instructions were provided to the                            patient.                           - Resume previous diet.                           - Continue present medications. Consider once daily                            citrucel for your irregular bowel habits,  intermittent abdominal pains.                           You will receive a letter within 2-3 weeks with the                            pathology results and my final recommendations.                           If the polyp(s) is proven to be 'pre-cancerous' on                            pathology, you will need repeat colonoscopy in 5                            years. If the polyp(s) is NOT 'precancerous' on                            pathology then you should repeat colon cancer                            screening in 10 years with colonoscopy without need                            for colon cancer screening by any method prior to                            then (including stool testing). Milus Banister, MD 10/06/2015 8:40:42 AM This report has been signed electronically.

## 2015-10-06 NOTE — Progress Notes (Signed)
Report to PACU, RN, vss, BBS= Clear.  

## 2015-10-09 ENCOUNTER — Telehealth: Payer: Self-pay | Admitting: *Deleted

## 2015-10-09 NOTE — Telephone Encounter (Signed)
  Follow up Call-  Call back number 10/06/2015  Post procedure Call Back phone  # [phone]  Permission to leave phone message Yes  Some recent data might be hidden     Patient questions:  Do you have a fever, pain , or abdominal swelling? No. Pain Score  0 *  Have you tolerated food without any problems? Yes.    Have you been able to return to your normal activities? Yes.    Do you have any questions about your discharge instructions: Diet   No. Medications  No. Follow up visit  No.  Do you have questions or concerns about your Care? No.  Actions: * If pain score is 4 or above: No action needed, pain <4.

## 2015-10-12 ENCOUNTER — Encounter: Payer: Self-pay | Admitting: Gastroenterology

## 2015-10-13 DIAGNOSIS — F331 Major depressive disorder, recurrent, moderate: Secondary | ICD-10-CM | POA: Diagnosis not present

## 2015-10-16 ENCOUNTER — Other Ambulatory Visit (INDEPENDENT_AMBULATORY_CARE_PROVIDER_SITE_OTHER): Payer: Medicare Other

## 2015-10-16 DIAGNOSIS — E785 Hyperlipidemia, unspecified: Secondary | ICD-10-CM | POA: Diagnosis not present

## 2015-10-16 LAB — LIPID PANEL
CHOL/HDL RATIO: 3
CHOLESTEROL: 240 mg/dL — AB (ref 0–200)
HDL: 71.5 mg/dL (ref >39.00)
LDL Cholesterol: 146 mg/dL — ABNORMAL HIGH (ref 0–99)
NonHDL: 168.69
TRIGLYCERIDES: 113 mg/dL (ref 0.0–149.0)
VLDL: 22.6 mg/dL (ref 0.0–40.0)

## 2015-10-20 ENCOUNTER — Telehealth: Payer: Self-pay | Admitting: Gastroenterology

## 2015-10-20 NOTE — Telephone Encounter (Signed)
The pt states that she continues to have abd pain, it is intermittent.  The pain radiates to her back. Her bowels are normal, she also states the pain ins not reflux related.  She has had that pain before.  She has begun to keep a food diary and will call back if she determines if a food is the cause.  She has tried fiber but she says this pain is high up and doesn't feel like a bowel issue.  She does have a follow up scheduled for 12/12. Please advise for further recommendations.

## 2015-10-20 NOTE — Telephone Encounter (Signed)
Dear Ms. Wallis,  One polyp which I removed during your recent procedure was proven to be completely benign but is considered a "pre-cancerous" polyp that MAY have grown into cancer if it had not been removed.  Studies shows that at least 20% of women over age 69 and 30% of men over age 69 have pre-cancerous polyps.  Based on current nationally recognized surveillance guidelines, I recommend that you have a repeat colonoscopy in 5 years.   If you develop any new rectal bleeding, abdominal pain or significant bowel habit changes, please contact me before then.

## 2015-10-20 NOTE — Telephone Encounter (Signed)
Lets get an abd [place] for the epigastric pain.  Perhaps biliary cause?  Thanks

## 2015-10-23 ENCOUNTER — Other Ambulatory Visit: Payer: Self-pay

## 2015-10-23 DIAGNOSIS — R1084 Generalized abdominal pain: Secondary | ICD-10-CM

## 2015-10-23 NOTE — Telephone Encounter (Signed)
Apologized to patient for not getting a call back on 10/20/15. She is in agreement to go for the u/s on 10/27/15 arrive at 7:45 am fasting to [facility] long Radiology.

## 2015-10-27 ENCOUNTER — Ambulatory Visit (HOSPITAL_COMMUNITY)
Admission: RE | Admit: 2015-10-27 | Discharge: 2015-10-27 | Disposition: A | Discharge status: Home / Self Care | Payer: Medicare Other | Source: Ambulatory Visit | Attending: Gastroenterology | Admitting: Gastroenterology

## 2015-10-27 DIAGNOSIS — K8020 Calculus of gallbladder without cholecystitis without obstruction: Secondary | ICD-10-CM | POA: Diagnosis not present

## 2015-10-27 DIAGNOSIS — R1084 Generalized abdominal pain: Secondary | ICD-10-CM

## 2015-10-27 DIAGNOSIS — R109 Unspecified abdominal pain: Secondary | ICD-10-CM | POA: Diagnosis present

## 2015-11-06 DIAGNOSIS — F331 Major depressive disorder, recurrent, moderate: Secondary | ICD-10-CM | POA: Diagnosis not present

## 2015-11-07 DIAGNOSIS — Z23 Encounter for immunization: Secondary | ICD-10-CM | POA: Diagnosis not present

## 2015-11-07 DIAGNOSIS — F331 Major depressive disorder, recurrent, moderate: Secondary | ICD-10-CM | POA: Diagnosis not present

## 2015-11-09 ENCOUNTER — Encounter: Payer: Self-pay | Admitting: General Surgery

## 2015-11-09 ENCOUNTER — Telehealth: Payer: Self-pay | Admitting: Family Medicine

## 2015-11-09 ENCOUNTER — Other Ambulatory Visit: Payer: Self-pay | Admitting: General Surgery

## 2015-11-09 DIAGNOSIS — K8020 Calculus of gallbladder without cholecystitis without obstruction: Secondary | ICD-10-CM

## 2015-11-09 HISTORY — DX: Calculus of gallbladder without cholecystitis without obstruction: K80.20

## 2015-11-09 NOTE — Telephone Encounter (Signed)
Spoke with patient, patient states that she understands why she is taking Lamotrigine after I educated patient about Lamotrigine and what the uses. Patient had no further questions, or concerns.

## 2015-11-09 NOTE — Telephone Encounter (Signed)
Pt called in to be advised. She says that she is taking lamoTRIgine  And would like to know why? What is the reason that she is taking medication. Please call pt back to assist further.   Thanks.

## 2015-11-14 DIAGNOSIS — F419 Anxiety disorder, unspecified: Secondary | ICD-10-CM | POA: Diagnosis not present

## 2015-11-14 DIAGNOSIS — F331 Major depressive disorder, recurrent, moderate: Secondary | ICD-10-CM | POA: Diagnosis not present

## 2015-11-21 DIAGNOSIS — F331 Major depressive disorder, recurrent, moderate: Secondary | ICD-10-CM | POA: Diagnosis not present

## 2015-11-28 ENCOUNTER — Encounter (HOSPITAL_COMMUNITY): Payer: Self-pay

## 2015-11-28 NOTE — Pre-Procedure Instructions (Addendum)
    Toni [name]  11/28/2015      [facility] [place] [place] [address] - [address] [address] Phone: [phone] Fax: [phone]    Your procedure is scheduled on  Monday, Nov. 6  Report to [facility] Admitting at 5:30 A.M.  Call this number if you have problems the morning of surgery:  [phone]   Remember:  Do not eat food or drink liquids after midnight.  Take these medicines the morning of surgery with A SIP OF WATER: xanax if needed, cymbalta (duloxetine), gabapentin (neurontin), lamictal (lamotrigine), ranitidine (zantac), tizanidine (zanaflex)               Stop vitamins and herbal medicines, vit. E, ibuprofen, advil, motrin, aleve, BC Powders, and goody's.               Do not wear jewelry, make-up or nail polish.  Do not wear lotions, powders, or perfumes, or deoderant.  Do not shave 48 hours prior to surgery.  Men may shave face and neck.  Do not bring valuables to the hospital.  [facility] is not responsible for any belongings or valuables.  Contacts, dentures or bridgework may not be worn into surgery.  Leave your suitcase in the car.  After surgery it may be brought to your room.  For patients admitted to the hospital, discharge time will be determined by your treatment team.  Patients discharged the day of surgery will not be allowed to drive home.   Name and phone number of your driver:    Special instructions:  Preparing for surgery handout  Please read over the following fact sheets that you were given. Coughing and Deep Breathing

## 2015-11-29 ENCOUNTER — Encounter (HOSPITAL_COMMUNITY): Payer: Self-pay

## 2015-11-29 ENCOUNTER — Encounter (HOSPITAL_COMMUNITY)
Admission: RE | Admit: 2015-11-29 | Discharge: 2015-11-29 | Disposition: A | Discharge status: Home / Self Care | Payer: Medicare Other | Source: Ambulatory Visit | Attending: General Surgery | Admitting: General Surgery

## 2015-11-29 DIAGNOSIS — Z01812 Encounter for preprocedural laboratory examination: Secondary | ICD-10-CM | POA: Diagnosis not present

## 2015-11-29 DIAGNOSIS — Z01810 Encounter for preprocedural cardiovascular examination: Secondary | ICD-10-CM | POA: Diagnosis not present

## 2015-11-29 DIAGNOSIS — R0789 Other chest pain: Secondary | ICD-10-CM | POA: Diagnosis not present

## 2015-11-29 DIAGNOSIS — K8020 Calculus of gallbladder without cholecystitis without obstruction: Secondary | ICD-10-CM | POA: Diagnosis not present

## 2015-11-29 DIAGNOSIS — F319 Bipolar disorder, unspecified: Secondary | ICD-10-CM | POA: Diagnosis not present

## 2015-11-29 HISTORY — DX: Headache: R51

## 2015-11-29 HISTORY — DX: Headache, unspecified: R51.9

## 2015-11-29 HISTORY — DX: Gastro-esophageal reflux disease without esophagitis: K21.9

## 2015-11-29 LAB — COMPREHENSIVE METABOLIC PANEL
ALBUMIN: 4.8 g/dL (ref 3.5–5.0)
ALK PHOS: 51 U/L (ref 38–126)
ALT: 10 U/L — AB (ref 14–54)
AST: 22 U/L (ref 15–41)
Anion gap: 10 (ref 5–15)
BILIRUBIN TOTAL: 0.7 mg/dL (ref 0.3–1.2)
BUN: 9 mg/dL (ref 6–20)
CALCIUM: 10.8 mg/dL — AB (ref 8.9–10.3)
CO2: 27 mmol/L (ref 22–32)
CREATININE: 0.87 mg/dL (ref 0.44–1.00)
Chloride: 101 mmol/L (ref 101–111)
GFR calc non Af Amer: >60 mL/min (ref >60)
GLUCOSE: 108 mg/dL — AB (ref 65–99)
Potassium: 3.8 mmol/L (ref 3.5–5.1)
SODIUM: 138 mmol/L (ref 135–145)
Total Protein: 7.8 g/dL (ref 6.5–8.1)

## 2015-11-29 LAB — CBC WITH DIFFERENTIAL/PLATELET
Basophils Absolute: 0 10*3/uL (ref 0.0–0.1)
Basophils Relative: 0 %
EOS ABS: 0.1 10*3/uL (ref 0.0–0.7)
Eosinophils Relative: 1 %
HEMATOCRIT: 41.8 % (ref 36.0–46.0)
HEMOGLOBIN: 14.8 g/dL (ref 12.0–15.0)
LYMPHS ABS: 2.2 10*3/uL (ref 0.7–4.0)
Lymphocytes Relative: 23 %
MCH: 32.7 pg (ref 26.0–34.0)
MCHC: 35.4 g/dL (ref 30.0–36.0)
MCV: 92.5 fL (ref 78.0–100.0)
Monocytes Absolute: 0.6 10*3/uL (ref 0.1–1.0)
Monocytes Relative: 7 %
NEUTROS ABS: 6.7 10*3/uL (ref 1.7–7.7)
NEUTROS PCT: 69 %
Platelets: 293 10*3/uL (ref 150–400)
RBC: 4.52 MIL/uL (ref 3.87–5.11)
RDW: 13.5 % (ref 11.5–15.5)
WBC: 9.6 10*3/uL (ref 4.0–10.5)

## 2015-11-29 NOTE — Progress Notes (Addendum)
CP:[id number] Toni [name]  no cardiologist Pt. Sees counselors @ [facility], Dr. Tawanna Cooler  And Celesta Gentile.  Pt. States she has chest/breast pain on the left side at times. Sometimes it last through out the day. .States "I've come accustomed to it" States it's been going on for awhile.

## 2015-11-29 NOTE — Progress Notes (Addendum)
Anesthesia PAT Evaluation: Patient is a 70 year old female scheduled for laparoscopic cholecystectomy, possible open on 12/04/15 by Dr. [name].  History includes fibromyalgia, former smoker (quit '13), COPD, HTN, HLD, depression (severe), anxiety, diverticulitis with sigmoid colectomy/colostomy s/p takedown '01 ([place]), GERD, headaches, T&A, right THA with periprosthetic fracture s/p ORIF '11. Daily marijuana use and 2-3 bottles of wine/week (down from 1 bottle a day in the past).  PCP is Dr. Roma Schanz. Psychiatrist is Dr. Casimiro Needle. Therapist is [place]. She was evaluated by cardiologist Dr. Daneen Schick on 01/11/13 for DOE with highly atypical chest pain and recurring event of nausea, diaphoresis and weakness, possible neurocardiogenic in origin. He ordered a BNP and echo, and if unremarkable did not feel further cardiac evaluation was necessary. BNP and echo were normal.   Meds include Xanax, biotin, Cymbalta, gabapentin, Lamictal, magnesium citrate, melatonin, Zantac, selenium, Restoril, Zanaflex, valerian, vitamins E, B, C.  BP (!) 160/88   Pulse 94   Temp 36.8 C   Resp 20   Ht 5' 5.5" (1.664 m)   Wt 136 lb 8 oz (61.9 kg)   SpO2 97%   BMI 22.37 kg/m   Exam findings show a Caucasian female in NAD. Heart RRR, no murmur noted. Lungs clear. No ankle edema. No carotid bruits noted. Patient was tearful during my evaluation. I saw her due to vague reports of chronic left chest pains that I wanted to sort out further. She says that pains have probably been there for years. It feels like it could be her breast, but reports mammograms have been okay. She denied chest wall tenderness. She says it just stays "sore" not really painful, nor does it bother her much. They occur randomly and can last for hours to days. They are not associated with and do not worsen with activity. Nothing seems to bring the pains on, and nothing seems to make them better--they just eventually go  away on there own. There is no associated symptoms such as palpitations, SOB. She does not really remember seeing Dr. Tamala Julian in 2014, but does remember getting the echo. She doesn't recall ever telling him about chest pains (although his note mentions), but rather said she saw him for a single episode of feeling weak wtih cold sweats. She says she was told she was likely dehydrated, and has not had any recurrent episodes. Her severe depression really effects her activity level--it's a struggle for her to get out of bed and care for herself. She has stable DOE after walking about one block (no change in the past few years). She is tearful about not knowing if surgery will be laparoscopic or open, about her younger sister with Down's Syndrome who has end stage dementia and lives in [place], that she is living in her mother's house who died 3 years ago, and about just generally feeling depressed since she was in her twenties. She reports her antidepressants were changed a few weeks ago, and she is seeing her therapist on a weekly basis. Her mother and sister have a history of irregular heart beat (afib?). Her mother had an embolic CVA and her father a hemorrhagic CVA. Her GF had an MI in his 29's and "died on the table" but was resuscitated and lived to be 58.     11/29/15 EKG: NSR.  01/11/13 Echo: Study Conclusions Left ventricle: The cavity size was normal. Wall thickness was normal. Systolic function was normal. The estimated ejection fraction was in the range of  50% to 55%. Wall motion was normal; there were no regional wall motion abnormalities. There was an increased relative contribution of atrial contraction to ventricular filling.  Preoperative labs noted. Cr 0.87. Glucose 108. AST 22, ALT 10. CBC WNL.   Discussed above with anesthesiologist Dr. Linna Caprice. Although chest pain is chronic, patient is not very active. Dr. Linna Caprice recommended preoperative cardiology evaluation. I have notified patient and [facility]  triage RN Sunday Spillers.   George Hugh [facility] Stay Center/Anesthesiology Phone [phone] 11/29/2015 4:59 PM  Addendum: Patient was seen by Kerin Ransom, PA-C with cardiologist Dr. Percival Spanish today for evaluation of atypical chest pain and pre-operative evaluation. An exercise treadmill stress test was done (see below):  12/01/15 ETT: Stress Findings The patient exercised following the Bruce protocol.   The test was stopped because the patient complained of shortness of breath.   85% of maximum heart rate was achieved after 2 minutes. Recovery time: 8 minutes.  [facility] Treadmill Score: intermediate risk Pt complained of leg/hip discomfort  "Pt was able to perform exercise treadmill. No significant EKG changes or chest pain. She is cleared for surgery from cardiac standpoint, we will be available as needed. Her B/P was elevated today in the offcie and this should be followed up by her PCP, as well as her lipids.  Kerin Ransom PA-C 12/01/2015 12:38 PM"    Patient has been cleared by cardiology. If no acute changes then I anticipate that she can proceed as planned.  George Hugh [facility] Short Stay Center/Anesthesiology Phone [phone] 12/01/2015 1:04 PM

## 2015-12-01 ENCOUNTER — Ambulatory Visit (HOSPITAL_COMMUNITY)
Admission: RE | Admit: 2015-12-01 | Discharge: 2015-12-01 | Disposition: A | Discharge status: Home / Self Care | Payer: Medicare Other | Source: Ambulatory Visit | Attending: Cardiology | Admitting: Cardiology

## 2015-12-01 ENCOUNTER — Encounter: Payer: Self-pay | Admitting: Cardiology

## 2015-12-01 ENCOUNTER — Other Ambulatory Visit: Payer: Self-pay

## 2015-12-01 ENCOUNTER — Other Ambulatory Visit: Payer: Self-pay | Admitting: Family Medicine

## 2015-12-01 ENCOUNTER — Ambulatory Visit (INDEPENDENT_AMBULATORY_CARE_PROVIDER_SITE_OTHER): Payer: Medicare Other | Admitting: Cardiology

## 2015-12-01 VITALS — BP 152/81 | HR 86 | Ht 65.5 in | Wt 136.0 lb

## 2015-12-01 DIAGNOSIS — Z01810 Encounter for preprocedural cardiovascular examination: Secondary | ICD-10-CM

## 2015-12-01 DIAGNOSIS — R0789 Other chest pain: Secondary | ICD-10-CM

## 2015-12-01 DIAGNOSIS — F319 Bipolar disorder, unspecified: Secondary | ICD-10-CM | POA: Diagnosis not present

## 2015-12-01 DIAGNOSIS — K8020 Calculus of gallbladder without cholecystitis without obstruction: Secondary | ICD-10-CM | POA: Diagnosis not present

## 2015-12-01 DIAGNOSIS — Z01812 Encounter for preprocedural laboratory examination: Secondary | ICD-10-CM | POA: Diagnosis not present

## 2015-12-01 LAB — EXERCISE TOLERANCE TEST
Estimated workload: 5.5 METS
Exercise duration (min): 3 min
Exercise duration (sec): 45 s
MPHR: 151 {beats}/min
Peak HR: 146 {beats}/min
Percent HR: 96 %
RPE: 19
Rest HR: 78 {beats}/min

## 2015-12-01 MED ORDER — ALPRAZOLAM 1 MG PO TABS: ORAL_TABLET | ORAL | 0 refills | Status: DC

## 2015-12-01 NOTE — Telephone Encounter (Signed)
Message routed to PCP for approval/denial PC

## 2015-12-01 NOTE — Telephone Encounter (Signed)
Rx faxed [phone] [facility]

## 2015-12-01 NOTE — Progress Notes (Signed)
12/01/2015 Toni [name]   [birth year]  BB:[id number]  Primary Physician Ann Held, DO Primary Cardiologist: Dr Tamala Julian (saw once in 2014)  HPI:  Pleasant 70 y/o female referred for [place] today for cardiac clearance prior to what may turn out to be complicated cholecystectomy next week. The pt has no history of CAD. She saw Dr Tamala Julian for vague dyspnea in 2014 and had a normal echo then. She has never had a cath or an MI. She denies any exertional chest discomfort but does say she has intermittent chest "ache" that does not radiate or is associated with exertion,  nausea, vomiting, or diaphoresis. She does say she gets SOB when exerting herself though she attributes this to being "out of shape".  There is no history of DM, treated HTN, or a FM Hx of early CAD. She does have dyslipidemia with a recent LDL of 146. This has not been treated with statin Rx, (she offers a history of fibromyalgia and this may be why). She does have bipolar disorder as well and is on multiple medications. She was a bit manic in the office today but otherwise pleasant and appropriate.    Current Outpatient Prescriptions  Medication Sig Dispense Refill  . ALPRAZolam (XANAX) 1 MG tablet TAKE 1/2 TABLET BY MOUTH THREE TIMES DAILY AS NEEDED--- dr Casimiro Needle to take over (Patient taking differently: Take 1 mg by mouth 3 (three) times daily as needed for anxiety. TAKE 1/2 TABLET BY MOUTH THREE TIMES DAILY AS NEEDED--- dr Casimiro Needle to take over) 30 tablet 0  . Ascorbic Acid (VITAMIN C PO) Take by mouth.    . B-COMPLEX CAPS Take 1 capsule by mouth daily.    . Biotin 1000 MCG tablet Take 5,000 mcg by mouth daily.     . Calcium Citrate (CITRACAL PO) Take 1 tablet by mouth daily.    . cholecalciferol (VITAMIN D) 1000 UNITS tablet Take 1,000 Units by mouth daily.     . cyanocobalamin 1000 MCG tablet Take 2,000 mcg by mouth daily.     . DULoxetine (CYMBALTA) 30 MG capsule Take 30 mg by mouth daily.    . Flaxseed, Linseed, (FLAX SEED  OIL PO) Take 1 capsule by mouth daily.    Marland Kitchen gabapentin (NEURONTIN) 600 MG tablet TAKE 1 TABLET BY MOUTH THREE TIMES DAILY 90 tablet 5  . ibuprofen (ADVIL,MOTRIN) 200 MG tablet Take 200 mg by mouth every 6 (six) hours as needed for moderate pain.    Marland Kitchen lamoTRIgine (LAMICTAL) 100 MG tablet TAKE 1 TABLET BY MOUTH TWICE DAILY 60 tablet 5  . Magnesium Citrate 100 MG TABS Take 1 tablet by mouth daily.    . Melatonin 10 MG CAPS Take 1 capsule by mouth daily.    . Multiple Vitamin (MULTIVITAMIN) tablet Take 1 tablet by mouth daily.    . ranitidine (ZANTAC) 150 MG tablet Take 150 mg by mouth 2 (two) times daily.    . Selenium (SELENIMIN PO) Take 1 tablet by mouth daily.    . temazepam (RESTORIL) 15 MG capsule Take 30 mg by mouth at bedtime.   1  . tiZANidine (ZANAFLEX) 4 MG tablet TAKE 1 TO 2 TABLETS(4 TO 8 MG) BY MOUTH EVERY 6 HOURS AS NEEDED (Patient taking differently: TAKE 1 TO 2 TABLETS(4 TO 8 MG) BY MOUTH EVERY 6 HOURS AS NEEDED for muscle spamss) 30 tablet 0  . Valerian 450 MG CAPS Take 1 capsule by mouth daily.    . vitamin E 400 UNIT capsule  Take 400 Units by mouth daily.     Current Facility-Administered Medications  Medication Dose Route Frequency Provider Last Rate Last Dose  . 0.9 %  sodium chloride infusion  500 mL Intravenous Continuous Milus Banister, MD        Allergies  Allergen Reactions  . Levofloxacin     REACTION: RASH/REDNESS    Social History   Social History  . Marital status: Single    Spouse name: N/A  . Number of children: 1  . Years of education: N/A   Occupational History  . retired    Social History Main Topics  . Smoking status: Former Smoker    Types: Cigarettes    Quit date: 06/18/2011  . Smokeless tobacco: Never Used     Comment: marijuana  . Alcohol use Yes     Comment: 2-3 bottles of wine/week  . Drug use:     Frequency: 14.0 times per week    Types: Marijuana     Comment: regular use  . Sexual activity: Not Currently    Partners: Male    Other Topics Concern  . Not on file   Social History Narrative   Lives alone in a one story home.  Retired from different jobs.  Education: college.  Has one child.      Review of Systems: General: negative for chills, fever, night sweats or weight changes.  Cardiovascular: negative for chest pain, dyspnea on exertion, edema, orthopnea, palpitations, paroxysmal nocturnal dyspnea or shortness of breath Dermatological: negative for rash Respiratory: negative for cough or wheezing Urologic: negative for hematuria Abdominal: negative for nausea, vomiting, diarrhea, bright red blood per rectum, melena, or hematemesis Neurologic: negative for visual changes, syncope, or dizziness All other systems reviewed and are otherwise negative except as noted above.    Blood pressure (!) 152/81, pulse 86, height 5' 5.5" (1.664 m), weight 136 lb (61.7 kg).  General appearance: alert, cooperative and no distress Neck: no carotid bruit and no JVD Lungs: clear to auscultation bilaterally Heart: regular rate and rhythm Abdomen: non tender, mid line surgical scar Extremities: extremities normal, atraumatic, no cyanosis or edema Pulses: 2+ and symmetric Skin: Skin color, texture, turgor normal. No rashes or lesions Neurologic: Grossly normal  EKG NSR without acute changes  ASSESSMENT AND PLAN:   Pre-operative cardiovascular examination Pt sent today for pre op cardiac clearance for what may be complicated abdomina;l surgery next week.  Bipolar 1 disorder On multiple medications-pleasant though slightly manic today  Chest discomfort Somewhat atypical symptoms  Gallstones Needs cholecystectomy   PLAN  Discussed with Dr Percival Spanish- will try and get a POET in the office today.   Kerin Ransom PA-C 12/01/2015 11:57 AM   Pt was able to perform exercise treadmill. No significant EKG changes or chest pain. She is cleared for surgery from cardiac standpoint, we will be available as needed. Her B/P  was elevated today in the offcie and this should be followed up by her PCP, as well as her lipids.  Kerin Ransom PA-C 12/01/2015 12:38 PM   History and all data above reviewed.  Patient examined.  I agree with the findings as above. The patient has atypical chest pain.  Mild cardiovascular risk factors.   The patient exam reveals COR:RRR  ,  Lungs: Clear  ,  Abd: Positive bowel sounds, no rebound no guarding, Ext No edema  .  All available labs, radiology testing, previous records reviewed. Agree with documented assessment and plan. Preop:  I sent her  for a POET (Plain Old Exercise Treadmill) secondary to her pain and risk factors.  I reviewed this and this is reported elsewhere.  This was negative adequate and there is no evidence of ischemia or high risk findings.  She did have a hypertensive BP response.  Therefore, based on ACC/AHA guidelines, the patient would be at acceptable risk for the planned procedure without further cardiovascular testing.    Jeneen Rinks Hochrein  1:37 PM  12/01/2015

## 2015-12-01 NOTE — Assessment & Plan Note (Signed)
Somewhat atypical symptoms

## 2015-12-01 NOTE — Telephone Encounter (Signed)
Last seen 08/24/15 Last filled #[id number]  07/03/15 Please advise PC

## 2015-12-01 NOTE — Assessment & Plan Note (Signed)
Pt sent today for pre op cardiac clearance for what may be complicated abdomina;l surgery next week.

## 2015-12-01 NOTE — Assessment & Plan Note (Signed)
On multiple medications-pleasant though slightly manic today

## 2015-12-01 NOTE — Patient Instructions (Signed)
Medication Instructions:  Your physician recommends that you continue on your current medications as directed. Please refer to the Current Medication list given to you today.  Labwork: none  Testing/Procedures: none  Follow-Up: As needed   Any Other Special Instructions Will Be Listed Below (If Applicable).  You are cleared from a cardiac standpoint for your upcoming surgery. This will be sent to Dr [name]

## 2015-12-01 NOTE — Telephone Encounter (Signed)
Patient is requesting a refill of ALPRAZolam (XANAX) 1 MG tablet. Please advise.    Pharmacy: [facility] [place], [name]

## 2015-12-01 NOTE — Assessment & Plan Note (Signed)
Needs cholecystectomy

## 2015-12-03 MED ORDER — CEFAZOLIN SODIUM-DEXTROSE 2-4 GM/100ML-% IV SOLN
2.0000 g | INTRAVENOUS | Status: AC
  Administered 2015-12-04: 2 g via INTRAVENOUS
  Filled 2015-12-03: qty 100

## 2015-12-03 NOTE — H&P (Signed)
Toni [name] Location: [facility] Patient #: [id number] DOB: [birth year] Married / Language: English / Race: White Female       History of Present Illness  The patient is a 70 year old female who presents for evaluation of gall stones. This is a 70 year old Caucasian female, referred by Dr. Owens Loffler for evaluation of symptomatic gallstones. Dr. Elyn Aquas is her PCP.  The patient has at least a six-month history of intermittent episodes of abdominal bloating and discomfort. This is in the upper abdomen and right upper quadrant radiating to the right flank. She says perhaps sometimes it's related to meals but a lot of times is not. She has not taken her Zantac which she has a prescription for. She has alternating diarrhea and constipation consistent with chronic irritable bowel syndrome. Her weight has been stable. She is now having episodes of pain about every other day. She has a little bit of nausea but no vomiting. She doesn't really know of any relieving factors. The pain lasts 2-6 hours and then resolves.  Ultrasound shows multiple gallstones. Normal CBD. Otherwise negative.  Past history is significant for ruptured diverticulitis requiring two-stage colectomy with colostomy and open wound at [facility] in [place] in 2001. She recovered from that surgery. She's had a laparoscopic tubal ligation. She has depression. Denies bipolar disorder. COPD. Fibromyalgia. Hyperlipidemia. Hypertension. GERD. Family history reveals mother died of a stroke. Father died of hemorrhagic stroke. Social History reveals she is divorced. Lives alone in [place] but has friends. She lives in her mother's townhome. She denies cigarettes but smokes marijuana. Has 2 glasses of wine a day.  She will be scheduled for laparoscopic cholecystectomy with cholangiogram, possible open cholecystectomy. I told her increased risk of open cholecystectomy because  of the adhesions, but hopefully not. I discussed the indications, details, techniques, and numerous risk of the surgery with her. She is aware of the risk of bleeding, infection, conversion to open laparotomy, wound hernia, injury to adjacent organs requiring major reconstructive surgery, bile leak, and other unforeseen problems. She understands these issues well. This time all of her questions were answered. She agrees with this plan.  Note: Dr. Percival Spanish has seen and cleared patient for the planned surgery with acceptable risk.   Allergies  LevoFLOXacin *CHEMICALS*  Medication History  Temazepam (15MG  Capsule, Oral as needed) Active. Gabapentin (600MG  Tablet, Oral) Active. LamoTRIgine (100MG  Tablet, Oral) Active. Nortriptyline HCl (50MG  Capsule, Oral) Active. TiZANidine HCl (4MG  Tablet, Oral) Active. RaNITidine HCl (150MG  Tablet, Oral) Active. Medications Reconciled  Vitals  Weight: 136 lb Height: 65in Body Surface Area: 1.68 m Body Mass Index: 22.63 kg/m  Temp.: 39F(Temporal)  Pulse: 74 (Regular)  BP: 126/76 (Sitting, Left Arm, Standard)      Physical Exam   General Mental Status-Alert. General Appearance-Consistent with stated age. Hydration-Well hydrated. Voice-Normal.  Head and Neck Head-normocephalic, atraumatic with no lesions or palpable masses. Trachea-midline. Thyroid Gland Characteristics - normal size and consistency.  Eye Eyeball - Bilateral-Extraocular movements intact. Sclera/Conjunctiva - Bilateral-No scleral icterus.  Chest and Lung Exam Chest and lung exam reveals -quiet, even and easy respiratory effort with no use of accessory muscles and on auscultation, normal breath sounds, no adventitious sounds and normal vocal resonance. Inspection Chest Wall - Normal. Back - normal.  Cardiovascular Cardiovascular examination reveals -normal heart sounds, regular rate and rhythm with no murmurs and normal  pedal pulses bilaterally.  Abdomen Note: Soft and nontender. Midline incision both above and below the umbilicus. Transverse left  lower quadrant incision presume will colostomy site. No hernias. No organomegaly. Doesn't seem tender today.   Neurologic Neurologic evaluation reveals -alert and oriented x 3 with no impairment of recent or remote memory. Mental Status-Normal.  Musculoskeletal Normal Exam - Left-Upper Extremity Strength Normal and Lower Extremity Strength Normal. Normal Exam - Right-Upper Extremity Strength Normal and Lower Extremity Strength Normal.  Lymphatic Head & Neck  General Head & Neck Lymphatics: Bilateral - Description - Normal. Axillary  General Axillary Region: Bilateral - Description - Normal. Tenderness - Non Tender. Femoral & Inguinal  Generalized Femoral & Inguinal Lymphatics: Bilateral - Description - Normal. Tenderness - Non Tender.    Assessment & Plan  GALLSTONES (K80.20) Current Plans  Dr. Percival Spanish has cleared patient for surgery with acceptable risk.   Your ultrasound shows numerous gallstones, and so your gallbladder is diseased Your symptoms are very typical for gallbladder attacks, and will likely continue until something is done I do not believe you need any other tests to differentiate the diagnosis. We have decided to proceed with laparoscopic cholecystectomy, possible open cholecystectomy if the adhesions are bad I have discussed the indications, techniques, and numerous risk of the surgery with you  Please read the patient information booklet that we gave you  Follow a low-fat diet until we do the surgery I will try to expedite the surgery.  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 Toni [name], M.D., [facility] General and Minimally invasive Surgery Breast and Colorectal Surgery Office:   [phone] Pager:   [phone]

## 2015-12-03 NOTE — Anesthesia Preprocedure Evaluation (Signed)
Anesthesia Evaluation  Patient identified by MRN, date of birth, ID band Patient awake    Reviewed: Allergy & Precautions, H&P , Patient's Chart, lab work & pertinent test results, reviewed documented beta blocker date and time   Airway Mallampati: II  TM Distance: >3 FB Neck ROM: full    Dental no notable dental hx.    Pulmonary former smoker,    Pulmonary exam normal breath sounds clear to auscultation       Cardiovascular hypertension,  Rhythm:regular Rate:Normal     Neuro/Psych    GI/Hepatic   Endo/Other    Renal/GU      Musculoskeletal   Abdominal   Peds  Hematology   Anesthesia Other Findings   Reproductive/Obstetrics                           Anesthesia Physical Anesthesia Plan  ASA: II  Anesthesia Plan: General   Post-op Pain Management:    Induction: Intravenous  Airway Management Planned: Oral ETT  Additional Equipment:   Intra-op Plan:   Post-operative Plan: Extubation in OR  Informed Consent: I have reviewed the patients History and Physical, chart, labs and discussed the procedure including the risks, benefits and alternatives for the proposed anesthesia with the patient or authorized representative who has indicated his/her understanding and acceptance.   Dental Advisory Given and Dental advisory given  Plan Discussed with: CRNA and Surgeon  Anesthesia Plan Comments: (  Discussed general anesthesia, including possible nausea, instrumentation of airway, sore throat,pulmonary aspiration, etc. I asked if the were any outstanding questions, or  concerns before we proceeded. )        Anesthesia Quick Evaluation  

## 2015-12-04 ENCOUNTER — Ambulatory Visit (HOSPITAL_COMMUNITY): Payer: Medicare Other | Admitting: Vascular Surgery

## 2015-12-04 ENCOUNTER — Encounter (HOSPITAL_COMMUNITY): Payer: Self-pay | Admitting: Anesthesiology

## 2015-12-04 ENCOUNTER — Encounter (HOSPITAL_COMMUNITY)
Admission: RE | Disposition: A | Discharge status: Home / Self Care | Payer: Self-pay | Source: Ambulatory Visit | Attending: General Surgery

## 2015-12-04 ENCOUNTER — Ambulatory Visit (HOSPITAL_COMMUNITY): Payer: Medicare Other | Admitting: Anesthesiology

## 2015-12-04 ENCOUNTER — Ambulatory Visit (HOSPITAL_COMMUNITY): Payer: Medicare Other

## 2015-12-04 ENCOUNTER — Ambulatory Visit (HOSPITAL_COMMUNITY)
Admission: RE | Admit: 2015-12-04 | Discharge: 2015-12-04 | Disposition: A | Discharge status: Home / Self Care | Payer: Medicare Other | Source: Ambulatory Visit | Attending: General Surgery | Admitting: General Surgery

## 2015-12-04 DIAGNOSIS — K5900 Constipation, unspecified: Secondary | ICD-10-CM | POA: Diagnosis not present

## 2015-12-04 DIAGNOSIS — Z87891 Personal history of nicotine dependence: Secondary | ICD-10-CM | POA: Insufficient documentation

## 2015-12-04 DIAGNOSIS — M797 Fibromyalgia: Secondary | ICD-10-CM | POA: Diagnosis not present

## 2015-12-04 DIAGNOSIS — K219 Gastro-esophageal reflux disease without esophagitis: Secondary | ICD-10-CM | POA: Insufficient documentation

## 2015-12-04 DIAGNOSIS — I10 Essential (primary) hypertension: Secondary | ICD-10-CM | POA: Diagnosis not present

## 2015-12-04 DIAGNOSIS — R197 Diarrhea, unspecified: Secondary | ICD-10-CM | POA: Insufficient documentation

## 2015-12-04 DIAGNOSIS — K8010 Calculus of gallbladder with chronic cholecystitis without obstruction: Secondary | ICD-10-CM | POA: Insufficient documentation

## 2015-12-04 DIAGNOSIS — F319 Bipolar disorder, unspecified: Secondary | ICD-10-CM | POA: Insufficient documentation

## 2015-12-04 DIAGNOSIS — J449 Chronic obstructive pulmonary disease, unspecified: Secondary | ICD-10-CM | POA: Diagnosis not present

## 2015-12-04 DIAGNOSIS — F1290 Cannabis use, unspecified, uncomplicated: Secondary | ICD-10-CM | POA: Insufficient documentation

## 2015-12-04 DIAGNOSIS — Z9049 Acquired absence of other specified parts of digestive tract: Secondary | ICD-10-CM | POA: Insufficient documentation

## 2015-12-04 DIAGNOSIS — Z8719 Personal history of other diseases of the digestive system: Secondary | ICD-10-CM | POA: Insufficient documentation

## 2015-12-04 DIAGNOSIS — E785 Hyperlipidemia, unspecified: Secondary | ICD-10-CM | POA: Diagnosis not present

## 2015-12-04 DIAGNOSIS — Z419 Encounter for procedure for purposes other than remedying health state, unspecified: Secondary | ICD-10-CM

## 2015-12-04 DIAGNOSIS — Z881 Allergy status to other antibiotic agents status: Secondary | ICD-10-CM | POA: Diagnosis not present

## 2015-12-04 DIAGNOSIS — Z79899 Other long term (current) drug therapy: Secondary | ICD-10-CM | POA: Diagnosis not present

## 2015-12-04 DIAGNOSIS — Z823 Family history of stroke: Secondary | ICD-10-CM | POA: Insufficient documentation

## 2015-12-04 DIAGNOSIS — M1990 Unspecified osteoarthritis, unspecified site: Secondary | ICD-10-CM | POA: Diagnosis not present

## 2015-12-04 HISTORY — PX: CHOLECYSTECTOMY: SHX55

## 2015-12-04 SURGERY — LAPAROSCOPIC CHOLECYSTECTOMY WITH INTRAOPERATIVE CHOLANGIOGRAM
Anesthesia: General | Site: Abdomen

## 2015-12-04 MED ORDER — SODIUM CHLORIDE 0.9 % IV SOLN: 250.0000 mL | INTRAVENOUS | Status: DC | PRN

## 2015-12-04 MED ORDER — BUPIVACAINE HCL (PF) 0.5 % IJ SOLN
INTRAMUSCULAR | Status: AC
  Filled 2015-12-04: qty 30

## 2015-12-04 MED ORDER — SODIUM CHLORIDE 0.9 % IV SOLN
INTRAVENOUS | Status: DC | PRN
  Administered 2015-12-04: 10 mL

## 2015-12-04 MED ORDER — 0.9 % SODIUM CHLORIDE (POUR BTL) OPTIME
TOPICAL | Status: DC | PRN
  Administered 2015-12-04: 1000 mL

## 2015-12-04 MED ORDER — LIDOCAINE 2% (20 MG/ML) 5 ML SYRINGE
INTRAMUSCULAR | Status: AC
  Filled 2015-12-04: qty 5

## 2015-12-04 MED ORDER — HYDROCODONE-ACETAMINOPHEN 5-325 MG PO TABS: 1.0000 | ORAL_TABLET | ORAL | 0 refills | Status: DC | PRN

## 2015-12-04 MED ORDER — EPHEDRINE 5 MG/ML INJ
INTRAVENOUS | Status: AC
  Filled 2015-12-04: qty 10

## 2015-12-04 MED ORDER — FENTANYL CITRATE (PF) 100 MCG/2ML IJ SOLN: 25.0000 ug | INTRAMUSCULAR | Status: DC | PRN

## 2015-12-04 MED ORDER — SUGAMMADEX SODIUM 200 MG/2ML IV SOLN
INTRAVENOUS | Status: DC | PRN
  Administered 2015-12-04: 123.4 mg via INTRAVENOUS

## 2015-12-04 MED ORDER — SUGAMMADEX SODIUM 200 MG/2ML IV SOLN
INTRAVENOUS | Status: AC
  Filled 2015-12-04: qty 2

## 2015-12-04 MED ORDER — ALBUTEROL SULFATE HFA 108 (90 BASE) MCG/ACT IN AERS
INHALATION_SPRAY | RESPIRATORY_TRACT | Status: DC | PRN
  Administered 2015-12-04: 6 via RESPIRATORY_TRACT

## 2015-12-04 MED ORDER — IOPAMIDOL (ISOVUE-300) INJECTION 61%
INTRAVENOUS | Status: AC
  Filled 2015-12-04: qty 50

## 2015-12-04 MED ORDER — ROCURONIUM BROMIDE 10 MG/ML (PF) SYRINGE
PREFILLED_SYRINGE | INTRAVENOUS | Status: AC
  Filled 2015-12-04: qty 10

## 2015-12-04 MED ORDER — SODIUM CHLORIDE 0.9 % IR SOLN
Status: DC | PRN
  Administered 2015-12-04: 1000 mL

## 2015-12-04 MED ORDER — LACTATED RINGERS IV SOLN
INTRAVENOUS | Status: DC | PRN
  Administered 2015-12-04 (×3): via INTRAVENOUS

## 2015-12-04 MED ORDER — FENTANYL CITRATE (PF) 100 MCG/2ML IJ SOLN
INTRAMUSCULAR | Status: AC
  Filled 2015-12-04: qty 4

## 2015-12-04 MED ORDER — ACETAMINOPHEN 325 MG PO TABS: 650.0000 mg | ORAL_TABLET | ORAL | Status: DC | PRN

## 2015-12-04 MED ORDER — SODIUM CHLORIDE 0.9% FLUSH: 3.0000 mL | INTRAVENOUS | Status: DC | PRN

## 2015-12-04 MED ORDER — SODIUM CHLORIDE 0.9 % IV SOLN: INTRAVENOUS | Status: DC

## 2015-12-04 MED ORDER — FENTANYL CITRATE (PF) 100 MCG/2ML IJ SOLN
INTRAMUSCULAR | Status: AC
  Filled 2015-12-04: qty 2

## 2015-12-04 MED ORDER — FENTANYL CITRATE (PF) 100 MCG/2ML IJ SOLN
INTRAMUSCULAR | Status: DC | PRN
  Administered 2015-12-04: 100 ug via INTRAVENOUS

## 2015-12-04 MED ORDER — OXYCODONE HCL 5 MG PO TABS: 5.0000 mg | ORAL_TABLET | ORAL | Status: DC | PRN

## 2015-12-04 MED ORDER — ONDANSETRON HCL 4 MG/2ML IJ SOLN
INTRAMUSCULAR | Status: AC
  Filled 2015-12-04: qty 4

## 2015-12-04 MED ORDER — MIDAZOLAM HCL 2 MG/2ML IJ SOLN
INTRAMUSCULAR | Status: AC
  Filled 2015-12-04: qty 2

## 2015-12-04 MED ORDER — SODIUM CHLORIDE 0.9% FLUSH: 3.0000 mL | Freq: Two times a day (BID) | INTRAVENOUS | Status: DC

## 2015-12-04 MED ORDER — PROPOFOL 10 MG/ML IV BOLUS
INTRAVENOUS | Status: AC
  Filled 2015-12-04: qty 40

## 2015-12-04 MED ORDER — FENTANYL CITRATE (PF) 100 MCG/2ML IJ SOLN
25.0000 ug | INTRAMUSCULAR | Status: DC | PRN
  Administered 2015-12-04 (×2): 25 ug via INTRAVENOUS

## 2015-12-04 MED ORDER — ROCURONIUM BROMIDE 100 MG/10ML IV SOLN
INTRAVENOUS | Status: DC | PRN
  Administered 2015-12-04: 30 mg via INTRAVENOUS

## 2015-12-04 MED ORDER — ONDANSETRON HCL 4 MG/2ML IJ SOLN
INTRAMUSCULAR | Status: DC | PRN
Start: 2015-12-04 — End: 2015-12-04
  Administered 2015-12-04: 4 mg via INTRAVENOUS

## 2015-12-04 MED ORDER — LIDOCAINE HCL (CARDIAC) 20 MG/ML IV SOLN
INTRAVENOUS | Status: DC | PRN
  Administered 2015-12-04: 100 mg via INTRAVENOUS

## 2015-12-04 MED ORDER — BUPIVACAINE HCL (PF) 0.5 % IJ SOLN
INTRAMUSCULAR | Status: DC | PRN
  Administered 2015-12-04: 20 mL

## 2015-12-04 MED ORDER — PROPOFOL 10 MG/ML IV BOLUS
INTRAVENOUS | Status: DC | PRN
  Administered 2015-12-04: 90 mg via INTRAVENOUS

## 2015-12-04 MED ORDER — EPHEDRINE SULFATE 50 MG/ML IJ SOLN
INTRAMUSCULAR | Status: DC | PRN
  Administered 2015-12-04: 10 mg via INTRAVENOUS
  Administered 2015-12-04: 15 mg via INTRAVENOUS

## 2015-12-04 MED ORDER — ACETAMINOPHEN 650 MG RE SUPP: 650.0000 mg | RECTAL | Status: DC | PRN

## 2015-12-04 SURGICAL SUPPLY — 43 items
ADH SKN CLS APL DERMABOND .7 (GAUZE/BANDAGES/DRESSINGS) ×1
APPLIER CLIP ROT 10 11.4 M/L (STAPLE) ×3
APR CLP MED LRG 11.4X10 (STAPLE) ×1
BAG SPEC RTRVL LRG 6X4 10 (ENDOMECHANICALS) ×1
BLADE SURG ROTATE 9660 (MISCELLANEOUS) IMPLANT
CANISTER SUCTION 2500CC (MISCELLANEOUS) ×3 IMPLANT
CHLORAPREP W/TINT 26ML (MISCELLANEOUS) ×3 IMPLANT
CLIP APPLIE ROT 10 11.4 M/L (STAPLE) ×1 IMPLANT
COVER MAYO STAND STRL (DRAPES) ×3 IMPLANT
COVER SURGICAL LIGHT HANDLE (MISCELLANEOUS) ×3 IMPLANT
DERMABOND ADVANCED (GAUZE/BANDAGES/DRESSINGS) ×2
DERMABOND ADVANCED .7 DNX12 (GAUZE/BANDAGES/DRESSINGS) ×1 IMPLANT
DRAPE C-ARM 42X72 X-RAY (DRAPES) ×3 IMPLANT
ELECT REM PT RETURN 9FT ADLT (ELECTROSURGICAL) ×3
ELECTRODE REM PT RTRN 9FT ADLT (ELECTROSURGICAL) ×1 IMPLANT
GLOVE BIO SURGEON STRL SZ7 (GLOVE) ×2 IMPLANT
GLOVE BIOGEL PI IND STRL 7.0 (GLOVE) IMPLANT
GLOVE BIOGEL PI INDICATOR 7.0 (GLOVE) ×2
GLOVE ECLIPSE 7.0 STRL STRAW (GLOVE) ×2 IMPLANT
GLOVE EUDERMIC 7 POWDERFREE (GLOVE) ×3 IMPLANT
GLOVE SURG ORTHO 8.0 STRL STRW (GLOVE) ×3 IMPLANT
GOWN STRL REUS W/ TWL LRG LVL3 (GOWN DISPOSABLE) ×2 IMPLANT
GOWN STRL REUS W/ TWL XL LVL3 (GOWN DISPOSABLE) ×1 IMPLANT
GOWN STRL REUS W/TWL LRG LVL3 (GOWN DISPOSABLE) ×6
GOWN STRL REUS W/TWL XL LVL3 (GOWN DISPOSABLE) ×6
KIT BASIN OR (CUSTOM PROCEDURE TRAY) ×3 IMPLANT
KIT ROOM TURNOVER OR (KITS) ×3 IMPLANT
NS IRRIG 1000ML POUR BTL (IV SOLUTION) ×3 IMPLANT
PAD ARMBOARD 7.5X6 YLW CONV (MISCELLANEOUS) ×3 IMPLANT
POUCH SPECIMEN RETRIEVAL 10MM (ENDOMECHANICALS) ×3 IMPLANT
SCISSORS LAP 5X35 DISP (ENDOMECHANICALS) ×3 IMPLANT
SET CHOLANGIOGRAPH 5 50 .035 (SET/KITS/TRAYS/PACK) ×3 IMPLANT
SET IRRIG TUBING LAPAROSCOPIC (IRRIGATION / IRRIGATOR) ×3 IMPLANT
SLEEVE ENDOPATH XCEL 5M (ENDOMECHANICALS) ×5 IMPLANT
SPECIMEN JAR SMALL (MISCELLANEOUS) ×3 IMPLANT
SUT MNCRL AB 4-0 PS2 18 (SUTURE) ×3 IMPLANT
TOWEL OR 17X24 6PK STRL BLUE (TOWEL DISPOSABLE) ×3 IMPLANT
TOWEL OR 17X26 10 PK STRL BLUE (TOWEL DISPOSABLE) ×3 IMPLANT
TRAY LAPAROSCOPIC MC (CUSTOM PROCEDURE TRAY) ×3 IMPLANT
TROCAR XCEL BLUNT TIP 100MML (ENDOMECHANICALS) ×1 IMPLANT
TROCAR XCEL NON-BLD 11X100MML (ENDOMECHANICALS) ×6 IMPLANT
TROCAR XCEL NON-BLD 5MMX100MML (ENDOMECHANICALS) ×3 IMPLANT
TUBING INSUFFLATION (TUBING) ×3 IMPLANT

## 2015-12-04 NOTE — Op Note (Signed)
Patient Name:           Toni [name]   Date of Surgery:        12/04/2015  Pre op Diagnosis:      Chronic cholecystitis with cholelithiasis  Post op Diagnosis:    Chronic cholecystitis with cholelithiasis, extensive intra-abdominal adhesions  Procedure:                 Laparoscopic cholecystectomy, intraoperative cholangiogram, lysis of adhesions  Surgeon:                     Edsel Petrin. [name], M.D., FACS  Assistant:                      Armandina Gemma, M.D., FACS   Indication for Assistant: Complex adhesions, complex exposure.  Surgeon assist indicated to provide exposure and reduce intraoperative and postop complications.  Operative Indications:    This is a 70 year old Caucasian female, referred by Dr. Owens Loffler for evaluation of symptomatic gallstones. Dr. Garnet Koyanagi is her PCP.      The patient has at least a six-month history of intermittent episodes of abdominal bloating and discomfort. This is in the upper abdomen and right upper quadrant radiating to the right flank. She says perhaps sometimes it's related to meals but a lot of times is not. She has not taken her Zantac which she has a prescription for. She has alternating diarrhea and constipation consistent with chronic irritable bowel syndrome. Her weight has been stable. She is now having episodes of pain about every other day. She has a little bit of nausea but no vomiting. She doesn't really know of any relieving factors. The pain lasts 2-6 hours and then resolves.       Ultrasound shows multiple gallstones. Normal CBD. Otherwise negative.       Past history is significant for ruptured diverticulitis requiring two-stage colectomy with colostomy and open wound at [facility] in [place] in 2001. She recovered from that surgery. She's had a laparoscopic tubal ligation. She has depression. Denies bipolar disorder. COPD. Fibromyalgia. Hyperlipidemia. Hypertension. GERD.  She's had a recent cardiac risk  assessment by Dr. Percival Spanish and  he states that her stress test is negative and that she is acceptable risk for the proposed procedure..      Social History reveals she is divorced. Lives alone in [place] but has friends. She lives in her mother's townhome. She denies cigarettes but smokes marijuana. Has 2 glasses of wine a day.       She will be scheduled for laparoscopic cholecystectomy with cholangiogram, possible open cholecystectomy. I told her increased risk of open cholecystectomy because of the adhesions, but hopefully not. I discussed the indications, details, techniques, and numerous risk of the surgery with her. She is aware of the risk of bleeding, infection, conversion to open laparotomy, wound hernia, injury to adjacent organs requiring major reconstructive surgery, bile leak, and other unforeseen problems. She understands these issues well. This time all of her questions were answered. She agrees with this plan  Operative Findings:       She had extensive intra-abdominal adhesions which required at least 30 minutes to take down for port placement.  These adhesions were mostly omental and at no time did there seem to be any small bowel or large bowel adherent to the abdominal wall .The initial port was placed in the left subcostal region and we slowly took adhesions down until we could  see across the midline been placed 2 ports in the right upper quadrant takedown further adhesions.  The gallbladder was discolored and chronically inflamed.  The anatomy of the cystic duct cystic artery and common bile duct were conventional.  Intraoperative cholangiogram was normal, showing normal intrahepatic and hepatic biliary ductal anatomy, no filling defect, and no obstruction with good flow of contrast into the duodenum.  Procedure in Detail:          Following the induction of general endotracheal anesthesia the patient's abdomen was prepped and draped in a sterile fashion.  Surgical timeout  was performed.  Intravenous antibiotics were given.  0.5% Marcaine with epinephrine was used as a local infiltration anesthetic.     A 5 mm trocar was placed in the left subcostal  Region, midclavicular line with an optical port.  We inserted this very slowly and eventually got into the peritoneal space we could see the omentum below [place] and we slowly pushed omental adhesions down until we could see the falciform ligament.  We then found a window just below the falciform ligament ligament and entered that and pushed that down and we can see fairly clearly the liver and gallbladder.  Two 5 mm trochars were placed in the right upper quadrant.  We put the camera on the right side and took down adhesions in the midline down to just above the umbilicus.   10 mm trochars were placed in the subxiphoid region under direct vision.  A 10 mm trocar was placed just above and to the right  of the umbilicus under direct vision.  All these trochars were placed in areas with there were no adhesions.  We inspected from the right side and the left side for several times and saw no evidence of any bowel injury or involvement or bleeding.      The gallbladder fundus was elevated.  The infundibulum was retracted laterally.  The peritoneum was dissected off of the neck of the gallbladder and we were able to create a very nice critical view behind  the cystic duct and the cystic artery.  Cystic artery was secured with metal clips and divided.  The cholangiogram was performed using the C-arm.  The cholangiogram was normal as described above.  The cholangiocatheter was removed, the cystic duct was secured with multiple medical clips and divided.  The gallbladder was dissected from its bed with electrocautery, placed in a specimen bag and removed through the supra umbilical port.  There was a large palpable stone in the gallbladder.  The trochars were reinserted.  We inspected all areas of dissection and all trocar sites and everything  looked good.  Irrigation fluid was completely clear and there is no evidence of bile leak.      The trochars were removed under direct vision there was no bleeding from the trocar sites.  The pneumoperitoneum was released.  The skin incisions were closed with subcuticular sutures of 4-0 Monocryl and Dermabond.  Patient tolerated procedure well was taken to PACU in stable condition.  EBL 20 mL.  Counts correct.  Complications none.     Edsel Petrin. [name], M.D., FACS General and Minimally Invasive Surgery Breast and Colorectal Surgery  12/04/2015 9:13 AM

## 2015-12-04 NOTE — Discharge Instructions (Signed)
[facility] ______[facility] °LAPAROSCOPIC SURGERY: POST OP INSTRUCTIONS °Always review your discharge instruction sheet given to you by the facility where your surgery was performed. °IF YOU HAVE DISABILITY OR FAMILY LEAVE FORMS, YOU MUST BRING THEM TO THE OFFICE FOR PROCESSING.   °DO NOT GIVE THEM TO YOUR DOCTOR. ° °1. A prescription for pain medication may be given to you upon discharge.  Take your pain medication as prescribed, if needed.  If narcotic pain medicine is not needed, then you may take acetaminophen (Tylenol) or ibuprofen (Advil) as needed. °2. Take your usually prescribed medications unless otherwise directed. °3. If you need a refill on your pain medication, please contact your pharmacy.  They will contact our office to request authorization. Prescriptions will not be filled after 5pm or on week-ends. °4. You should follow a light diet the first few days after arrival home, such as soup and crackers, etc.  Be sure to include lots of fluids daily. °5. Most patients will experience some swelling and bruising in the area of the incisions.  Ice packs will help.  Swelling and bruising can take several days to resolve.  °6. It is common to experience some constipation if taking pain medication after surgery.  Increasing fluid intake and taking a stool softener (such as Colace) will usually help or prevent this problem from occurring.  A mild laxative (Milk of Magnesia or Miralax) should be taken according to package instructions if there are no bowel movements after 48 hours. °7. Unless discharge instructions indicate otherwise, you may remove your bandages 24-48 hours after surgery, and you may shower at that time.  You may have steri-strips (small skin tapes) in place directly over the incision.  These strips should be left on the skin for 7-10 days.  If your surgeon used skin glue on the incision, you may shower in 24 hours.  The glue will flake off over the next 2-3 weeks.  Any sutures or  staples will be removed at the office during your follow-up visit. °8. ACTIVITIES:  You may resume regular (light) daily activities beginning the next day--such as daily self-care, walking, climbing stairs--gradually increasing activities as tolerated.  You may have sexual intercourse when it is comfortable.  Refrain from any heavy lifting or straining until approved by your doctor. °a. You may drive when you are no longer taking prescription pain medication, you can comfortably wear a seatbelt, and you can safely maneuver your car and apply brakes. °b. RETURN TO WORK:  __________________________________________________________ °9. You should see your doctor in the office for a follow-up appointment approximately 2-3 weeks after your surgery.  Make sure that you call for this appointment within a day or two after you arrive home to insure a convenient appointment time. °10. OTHER INSTRUCTIONS: __________________________________________________________________________________________________________________________ __________________________________________________________________________________________________________________________ °WHEN TO CALL YOUR DOCTOR: °1. Fever over 101.0 °2. Inability to urinate °3. Continued bleeding from incision. °4. Increased pain, redness, or drainage from the incision. °5. Increasing abdominal pain ° °The clinic staff is available to answer your questions during regular business hours.  Please don’t hesitate to call and ask to speak to one of the nurses for clinical concerns.  If you have a medical emergency, go to the nearest emergency room or call 911.  A surgeon from [facility] is always on call at the hospital. °[address], [address], [place], [place]  [address] ? [address] [address], [place], [place]   [address] °[phone] ? [phone] ? FAX [phone] °Web site:   [email or web address] °

## 2015-12-04 NOTE — Anesthesia Procedure Notes (Signed)
Procedure Name: Intubation Date/Time: 12/04/2015 7:48 AM Performed by: Scheryl Darter Pre-anesthesia Checklist: Patient identified, Emergency Drugs available, Suction available and Patient being monitored Patient Re-evaluated:Patient Re-evaluated prior to inductionOxygen Delivery Method: Circle System Utilized Preoxygenation: Pre-oxygenation with 100% oxygen Intubation Type: IV induction Ventilation: Mask ventilation without difficulty Tube type: Oral Tube size: 7.0 mm Number of attempts: 1 Airway Equipment and Method: Stylet Placement Confirmation: ETT inserted through vocal cords under direct vision,  positive ETCO2 and breath sounds checked- equal and bilateral Tube secured with: Tape Dental Injury: Teeth and Oropharynx as per pre-operative assessment

## 2015-12-04 NOTE — Anesthesia Postprocedure Evaluation (Signed)
Anesthesia Post Note  Patient: Toni [name]  Procedure(s) Performed: Procedure(s) (LRB): LAPAROSCOPIC CHOLECYSTECTOMY WITH INTRAOPERATIVE CHOLANGIOGRAM (N/A)  Patient location during evaluation: PACU Anesthesia Type: General Level of consciousness: awake and alert Pain management: pain level controlled Vital Signs Assessment: post-procedure vital signs reviewed and stable Respiratory status: spontaneous breathing, nonlabored ventilation, respiratory function stable and patient connected to nasal cannula oxygen Cardiovascular status: blood pressure returned to baseline and stable Postop Assessment: no signs of nausea or vomiting Anesthetic complications: no    Last Vitals:  Vitals:   12/04/15 1015 12/04/15 1030  BP: 115/65 (!) 120/52  Pulse: 74 71  Resp: 15 13  Temp:      Last Pain:  Vitals:   12/04/15 0915  TempSrc:   PainSc: 2                  Trajon Rosete EDWARD

## 2015-12-04 NOTE — Transfer of Care (Signed)
Immediate Anesthesia Transfer of Care Note  Patient: Toni [name]  Procedure(s) Performed: Procedure(s): LAPAROSCOPIC CHOLECYSTECTOMY WITH INTRAOPERATIVE CHOLANGIOGRAM (N/A)  Patient Location: PACU  Anesthesia Type:General  Level of Consciousness: awake, alert , oriented and sedated  Airway & Oxygen Therapy: Patient Spontanous Breathing and Patient connected to nasal cannula oxygen  Post-op Assessment: Report given to RN, Post -op Vital signs reviewed and stable and Patient moving all extremities  Post vital signs: Reviewed and stable  Last Vitals:  Vitals:   12/04/15 0646  BP: (!) 157/68  Pulse: 81  Resp: 20  Temp: 36.8 C    Last Pain:  Vitals:   12/04/15 0646  TempSrc: Oral         Complications: No apparent anesthesia complications

## 2015-12-04 NOTE — Interval H&P Note (Signed)
History and Physical Interval Note:  12/04/2015 6:10 AM  Toni [name]  has presented today for surgery, with the diagnosis of GALLSTONES  The various methods of treatment have been discussed with the patient and family. After consideration of risks, benefits and other options for treatment, the patient has consented to  Procedure(s): LAPAROSCOPIC CHOLECYSTECTOMY WITH INTRAOPERATIVE CHOLANGIOGRAM, POSSIBLE OPEN (N/A) as a surgical intervention .  The patient's history has been reviewed, patient examined, no change in status, stable for surgery.  I have reviewed the patient's chart and labs.  Questions were answered to the patient's satisfaction.     Adin Hector

## 2015-12-05 ENCOUNTER — Encounter (HOSPITAL_COMMUNITY): Payer: Self-pay | Admitting: General Surgery

## 2015-12-11 NOTE — Telephone Encounter (Signed)
[employer].  Rx was received on 12/01/15.

## 2015-12-14 DIAGNOSIS — F419 Anxiety disorder, unspecified: Secondary | ICD-10-CM | POA: Diagnosis not present

## 2015-12-14 DIAGNOSIS — F331 Major depressive disorder, recurrent, moderate: Secondary | ICD-10-CM | POA: Diagnosis not present

## 2015-12-26 DIAGNOSIS — F331 Major depressive disorder, recurrent, moderate: Secondary | ICD-10-CM | POA: Diagnosis not present

## 2015-12-26 DIAGNOSIS — F419 Anxiety disorder, unspecified: Secondary | ICD-10-CM | POA: Diagnosis not present

## 2016-01-02 DIAGNOSIS — F331 Major depressive disorder, recurrent, moderate: Secondary | ICD-10-CM | POA: Diagnosis not present

## 2016-01-09 ENCOUNTER — Ambulatory Visit: Payer: Medicare Other | Admitting: Gastroenterology

## 2016-01-10 DIAGNOSIS — F419 Anxiety disorder, unspecified: Secondary | ICD-10-CM | POA: Diagnosis not present

## 2016-01-10 DIAGNOSIS — F331 Major depressive disorder, recurrent, moderate: Secondary | ICD-10-CM | POA: Diagnosis not present

## 2016-01-17 DIAGNOSIS — F331 Major depressive disorder, recurrent, moderate: Secondary | ICD-10-CM | POA: Diagnosis not present

## 2016-01-18 ENCOUNTER — Telehealth: Payer: Self-pay | Admitting: Family Medicine

## 2016-01-18 NOTE — Telephone Encounter (Addendum)
°  Relation to [id number] Call back number: [phone] Pharmacy: [facility] [place], [place] [phone] (Phone) [phone] (Fax)     Reason for call: Patient requesting ibuprofen (ADVIL,MOTRIN) 600 MG tablet, please advise

## 2016-01-19 DIAGNOSIS — F331 Major depressive disorder, recurrent, moderate: Secondary | ICD-10-CM | POA: Diagnosis not present

## 2016-01-19 MED ORDER — IBUPROFEN 200 MG PO TABS: 200.0000 mg | ORAL_TABLET | Freq: Four times a day (QID) | ORAL | 0 refills | Status: DC | PRN

## 2016-01-19 NOTE — Telephone Encounter (Signed)
Rx sent to pharmacy, per pt's request. LB

## 2016-01-25 NOTE — Telephone Encounter (Signed)
Patient called stating that Ibuprofen 200 MG was sent into the pharmacy. She requested Ibuprofen 600 MG be sent to the pharmacy. Please advise.    Pharmacy: [facility] [place], [place]

## 2016-01-30 NOTE — Telephone Encounter (Signed)
Rx Ibuprofen 200 mg is on pt's medication list not Ibuprofen 600mg . Per provider, provider need a reason pt is in need of Ibuprofen 600 mg because she have to attach it to a diagnosis. Pt need to see provider for further evaluation for Ibuprofen 600 mg.

## 2016-01-31 DIAGNOSIS — F419 Anxiety disorder, unspecified: Secondary | ICD-10-CM | POA: Diagnosis not present

## 2016-01-31 DIAGNOSIS — F331 Major depressive disorder, recurrent, moderate: Secondary | ICD-10-CM | POA: Diagnosis not present

## 2016-02-02 NOTE — Telephone Encounter (Signed)
Patient called to follow up on the status of this medication. I informed the patient that she would need to be evaluated before the Ibuprofen 600 mg could be prescribed. Patient understood and has appointment scheduled for 02/09/16

## 2016-02-09 ENCOUNTER — Ambulatory Visit: Payer: Medicare Other | Admitting: Family Medicine

## 2016-02-10 ENCOUNTER — Other Ambulatory Visit: Payer: Self-pay | Admitting: Family Medicine

## 2016-02-20 DIAGNOSIS — F331 Major depressive disorder, recurrent, moderate: Secondary | ICD-10-CM | POA: Diagnosis not present

## 2016-03-01 DIAGNOSIS — F331 Major depressive disorder, recurrent, moderate: Secondary | ICD-10-CM | POA: Diagnosis not present

## 2016-03-01 DIAGNOSIS — F419 Anxiety disorder, unspecified: Secondary | ICD-10-CM | POA: Diagnosis not present

## 2016-03-13 DIAGNOSIS — F331 Major depressive disorder, recurrent, moderate: Secondary | ICD-10-CM | POA: Diagnosis not present

## 2016-03-14 DIAGNOSIS — F331 Major depressive disorder, recurrent, moderate: Secondary | ICD-10-CM | POA: Diagnosis not present

## 2016-03-15 ENCOUNTER — Ambulatory Visit: Payer: Medicare Other | Admitting: Family Medicine

## 2016-03-15 ENCOUNTER — Telehealth: Payer: Self-pay | Admitting: Family Medicine

## 2016-03-15 NOTE — Telephone Encounter (Signed)
Patient cancel 1pm appointment due to patient not feeling well, charge or no charge

## 2016-03-15 NOTE — Telephone Encounter (Signed)
No charge. 

## 2016-03-20 DIAGNOSIS — F331 Major depressive disorder, recurrent, moderate: Secondary | ICD-10-CM | POA: Diagnosis not present

## 2016-03-24 ENCOUNTER — Other Ambulatory Visit: Payer: Self-pay | Admitting: Family Medicine

## 2016-03-26 ENCOUNTER — Telehealth: Payer: Self-pay | Admitting: Family Medicine

## 2016-03-26 NOTE — Telephone Encounter (Signed)
Called patient to schedule annual wellness visit. Lvm for patient to call office to schedule appt.

## 2016-03-29 ENCOUNTER — Telehealth: Payer: Self-pay

## 2016-03-29 DIAGNOSIS — F331 Major depressive disorder, recurrent, moderate: Secondary | ICD-10-CM | POA: Diagnosis not present

## 2016-03-29 DIAGNOSIS — F419 Anxiety disorder, unspecified: Secondary | ICD-10-CM | POA: Diagnosis not present

## 2016-03-29 NOTE — Telephone Encounter (Signed)
Pre- Visit call.

## 2016-04-01 ENCOUNTER — Encounter: Payer: Self-pay | Admitting: Family Medicine

## 2016-04-01 ENCOUNTER — Ambulatory Visit (INDEPENDENT_AMBULATORY_CARE_PROVIDER_SITE_OTHER): Payer: Medicare Other | Admitting: Family Medicine

## 2016-04-01 VITALS — BP 150/82 | HR 87 | Temp 98.4°F | Resp 16 | Ht 65.0 in | Wt 132.6 lb

## 2016-04-01 DIAGNOSIS — L819 Disorder of pigmentation, unspecified: Secondary | ICD-10-CM

## 2016-04-01 DIAGNOSIS — Z1231 Encounter for screening mammogram for malignant neoplasm of breast: Secondary | ICD-10-CM | POA: Diagnosis not present

## 2016-04-01 DIAGNOSIS — E785 Hyperlipidemia, unspecified: Secondary | ICD-10-CM | POA: Diagnosis not present

## 2016-04-01 DIAGNOSIS — D229 Melanocytic nevi, unspecified: Secondary | ICD-10-CM

## 2016-04-01 DIAGNOSIS — I10 Essential (primary) hypertension: Secondary | ICD-10-CM | POA: Diagnosis not present

## 2016-04-01 DIAGNOSIS — M65312 Trigger thumb, left thumb: Secondary | ICD-10-CM

## 2016-04-01 DIAGNOSIS — Z780 Asymptomatic menopausal state: Secondary | ICD-10-CM

## 2016-04-01 DIAGNOSIS — Z1239 Encounter for other screening for malignant neoplasm of breast: Secondary | ICD-10-CM

## 2016-04-01 DIAGNOSIS — Z0000 Encounter for general adult medical examination without abnormal findings: Secondary | ICD-10-CM

## 2016-04-01 MED ORDER — LISINOPRIL 10 MG PO TABS
10.0000 mg | ORAL_TABLET | Freq: Every day | ORAL | 3 refills | Status: DC
Start: 2016-04-01 — End: 2016-06-06

## 2016-04-01 NOTE — Patient Instructions (Signed)

## 2016-04-01 NOTE — Progress Notes (Signed)
Subjective:   Toni [name] is a 71 y.o. female who presents for Medicare Annual (Subsequent) preventive examination.  Pt w/ PMH of depression and fibromyalgia. She reports that she has been working through some very difficult things from her past with her counselor and that the last few months have been very hard for her emotionally and mentally. She endorses anhedonia and states she does not like to leave her house. She is intermittently tearful throughout the visit. She has seen PCP for follow-up of depression today and also follows w/ Dr. Casimiro Needle (psychiatry). She denies SI.    Review of Systems:  No ROS.  Medicare Wellness Visit.  Cardiac Risk Factors include: advanced age (>76men, >77 women);hypertension;dyslipidemia;sedentary lifestyle  Sleep patterns: has difficulty falling asleep and has daytime sleepiness r/t depression.   Home Safety/Smoke Alarms: Feels safe in home. Smoke alarms in place.  Living environment; residence and Firearm Safety: Lives alone in [place]. 1-story house/ trailer, no firearms. Seat Belt Safety/Bike Helmet: Wears seat belt.   Counseling:   Eye Exam- Follows w/ Dr. Claudean Kinds, but may be looking for a new provider soon. Dental- Partial plate upper. Dr. Otila Kluver PRN.  Female:   Pap- N/A       Mammo- last 07/01/14. No report on file.     Dexa scan- last 06/30/12. Normal.        CCS- last 10/06/15. 1 polyp removed, diverticulosis, internal/external hemorrhoids. 5 year recall.       Objective:     Vitals: BP (!) 150/82 (BP Location: Left Arm, Patient Position: Sitting, Cuff Size: Normal)   Pulse 87   Temp 98.4 F (36.9 C) (Oral)   Resp 16   Ht 5\' 5"  (1.651 m)   Wt 132 lb 9.6 oz (60.1 kg)   SpO2 98%   BMI 22.07 kg/m   Body mass index is 22.07 kg/m.   Tobacco History  Smoking Status  . Current Every Day Smoker  . Last attempt to quit: 06/18/2011  Smokeless Tobacco  . Never Used    Comment: marijuana,  daily for years      Ready to quit:  No Counseling given: Yes   Past Medical History:  Diagnosis Date  . Bipolar disorder ([place])    pt denies  . COPD (chronic obstructive pulmonary disease) ([place])   . Depression   . Diverticulitis   . Diverticulitis   . Fibromyalgia   . Gallstones 11/09/2015  . GERD (gastroesophageal reflux disease)   . Headache   . Hyperlipidemia   . Hypertension   . Osteoarthritis    Past Surgical History:  Procedure Laterality Date  . CHOLECYSTECTOMY N/A 12/04/2015   Procedure: LAPAROSCOPIC CHOLECYSTECTOMY WITH INTRAOPERATIVE CHOLANGIOGRAM;  Surgeon: Fanny Skates, MD;  Location: [facility];  Service: General;  Laterality: N/A;  . COLON RESECTION  2001   sigmoid colon removed  . COLOSTOMY CLOSURE  2011  . FEMUR FRACTURE SURGERY  09/2009  . JOINT REPLACEMENT Right 09/2009   R hip,  redone, 06/14/2010 [place],---  done secondary to fractured fractured x2  . TONSILLECTOMY AND ADENOIDECTOMY    . TOTAL HIP ARTHROPLASTY Left    1979 and 1986   Family History  Problem Relation Age of Onset  . Stroke Mother   . Arthritis      mother family  . Depression      mother fanily  . Alcohol abuse      Both sides  . Cancer      mothers side  .  Stroke Father   . Achalasia Father   . Colon cancer Neg Hx    History  Sexual Activity  . Sexual activity: Not Currently  . Partners: Male    Outpatient Encounter Prescriptions as of 04/01/2016  Medication Sig  . ALPRAZolam (XANAX) 1 MG tablet TAKE 1/2 TABLET BY MOUTH THREE TIMES DAILY AS NEEDED--- dr Casimiro Needle to take over  . Ascorbic Acid (VITAMIN C PO) Take by mouth.  . B-COMPLEX CAPS Take 1 capsule by mouth daily.  . Biotin 1000 MCG tablet Take 5,000 mcg by mouth daily.   . Calcium Citrate (CITRACAL PO) Take 1 tablet by mouth daily.  . cholecalciferol (VITAMIN D) 1000 UNITS tablet Take 1,000 Units by mouth daily.   . cyanocobalamin 1000 MCG tablet Take 2,000 mcg by mouth daily.   . DULoxetine (CYMBALTA) 30 MG capsule Take 30 mg by mouth daily.  .  Flaxseed, Linseed, (FLAX SEED OIL PO) Take 1 capsule by mouth daily.  Marland Kitchen gabapentin (NEURONTIN) 600 MG tablet TAKE 1 TABLET BY MOUTH THREE TIMES DAILY  . ibuprofen (ADVIL,MOTRIN) 200 MG tablet Take 1 tablet (200 mg total) by mouth every 6 (six) hours as needed for moderate pain.  Marland Kitchen lamoTRIgine (LAMICTAL) 100 MG tablet TAKE 1 TABLET BY MOUTH TWICE DAILY  . Magnesium Citrate 100 MG TABS Take 1 tablet by mouth daily.  . Melatonin 10 MG CAPS Take 1 capsule by mouth daily.  . Multiple Vitamin (MULTIVITAMIN) tablet Take 1 tablet by mouth daily.  . ranitidine (ZANTAC) 150 MG tablet Take 150 mg by mouth 2 (two) times daily.  . Selenium (SELENIMIN PO) Take 1 tablet by mouth daily.  . temazepam (RESTORIL) 15 MG capsule Take 30 mg by mouth at bedtime.   Marland Kitchen tiZANidine (ZANAFLEX) 4 MG tablet TAKE 1 TO 2 TABLETS(4 TO 8 MG) BY MOUTH EVERY 6 HOURS AS NEEDED (Patient taking differently: TAKE 1 TO 2 TABLETS(4 TO 8 MG) BY MOUTH EVERY 6 HOURS AS NEEDED for muscle spamss)  . Valerian 450 MG CAPS Take 1 capsule by mouth daily.  . vitamin E 400 UNIT capsule Take 400 Units by mouth daily.  Marland Kitchen lisinopril (PRINIVIL,ZESTRIL) 10 MG tablet Take 1 tablet (10 mg total) by mouth daily.  Marland Kitchen venlafaxine XR (EFFEXOR-XR) 75 MG 24 hr capsule Take 75 mg by mouth daily.   Facility-Administered Encounter Medications as of 04/01/2016  Medication  . 0.9 %  sodium chloride infusion    Activities of Daily Living In your present state of health, do you have any difficulty performing the following activities: 04/01/2016 11/29/2015  Hearing? N N  Vision? N N  Difficulty concentrating or making decisions? N Y  Walking or climbing stairs? N N  Dressing or bathing? N N  Doing errands, shopping? N -  Preparing Food and eating ? N -  Using the Toilet? N -  Managing your Medications? N -  Managing your Finances? N -  Housekeeping or managing your Housekeeping? N -  Some recent data might be hidden    Patient Care Team: Ann Held, DO as PCP - General Norma Fredrickson, MD as Consulting Physician (Psychiatry) Norma Fredrickson, MD as Consulting Physician (Psychiatry) Ralene Bathe, MD as Consulting Physician (Ophthalmology) Milus Banister, MD as Consulting Physician (Gastroenterology)    Assessment:    Physical assessment deferred to PCP.  Exercise Activities and Dietary recommendations Current Exercise Habits: The patient does not participate in regular exercise at present  Diet (meal preparation, eat out, water intake,  caffeinated beverages, dairy products, fruits and vegetables): in general, a "healthy" diet  . Pt declines to discuss diet in detail stating "I have no interest in food." She reports she is overall not eating well and her appetite is affect by her severe depression. She does drink water.      Goals    . Keep specialist appointments      Fall Risk Fall Risk  04/01/2016 08/08/2014 07/22/2013 05/11/2012  Falls in the past year? Yes No No Yes  Number falls in past yr: 1 - - 2 or more  Injury with Fall? No - - -  Risk for fall due to : History of fall(s) - - History of fall(s)  Risk for fall due to (comments): - - - Normally trips over things   Depression Screen PHQ 2/9 Scores 04/01/2016 08/08/2014 07/22/2013 05/11/2012  PHQ - 2 Score 6 1 1 1   PHQ- 9 Score 15 - - -     Cognitive Function MMSE - Mini Mental State Exam 04/01/2016  Orientation to time 5  Orientation to Place 5  Registration 3  Attention/ Calculation 5  Recall 2  Language- name 2 objects 2  Language- repeat 1  Language- follow 3 step command 3  Language- read & follow direction 1  Write a sentence 1  Copy design 1  Total score 29        Immunization History  Administered Date(s) Administered  . Influenza Whole 11/09/2008, 11/29/2010  . Influenza-Unspecified 10/29/2015  . Pneumococcal Conjugate-13 08/08/2014  . Pneumococcal Polysaccharide-23 05/11/2012   Screening Tests Health Maintenance  Topic Date Due  .  TETANUS/TDAP  05/18/1965  . MAMMOGRAM  06/30/2016  . COLONOSCOPY  10/05/2020  . INFLUENZA VACCINE  Completed  . DEXA SCAN  Completed  . Hepatitis C Screening  Completed  . PNA vac Low Risk Adult  Completed      Plan:    Follow-up w/ PCP as directed.  Keep appointments w/ psych/counseling.  Orders placed for MMG and DEXA.  During the course of the visit the patient was educated and counseled about the following appropriate screening and preventive services:   Vaccines to include Pneumoccal, Influenza, Hepatitis B, Td, Zostavax, HCV  Cardiovascular Disease  Colorectal cancer screening  Bone density screening  Diabetes screening  Glaucoma screening  Mammography  Patient Instructions (the written plan) was given to the patient.   Dorrene German, RN  04/01/2016

## 2016-04-01 NOTE — Progress Notes (Signed)
Reviewed

## 2016-04-01 NOTE — Progress Notes (Signed)
Pre visit review using our clinic review tool, if applicable. No additional management support is needed unless otherwise documented below in the visit note. 

## 2016-04-01 NOTE — Progress Notes (Signed)
I acted as a Education administrator for Dr. Royden Purl, LPN     Subjective:    Patient ID: Toni [name], female    DOB: 1945-09-02, 71 y.o.   MRN: [id number]  Chief Complaint  Patient presents with  . Medicare Wellness  . Depression    Depression         This is a chronic problem.  The current episode started more than 1 year ago.   The problem occurs constantly.The problem is unchanged.  Associated symptoms include no headaches.     The symptoms are aggravated by nothing.  Compliance with treatment is poor.   Patient is in today for [employer]. Patient c/o depression getting worse. Patient also c/o mole change on left cheek, and c/o left thumb pain. Patient is very weepy, crying and state that she doesn't eat well, she stay in the bed, and she not doing well but she has an appointment to see a psychiatrist.   Patient Care Team: Ann Held, DO as PCP - General Norma Fredrickson, MD as Consulting Physician (Psychiatry) Norma Fredrickson, MD as Consulting Physician (Psychiatry) Ralene Bathe, MD as Consulting Physician (Ophthalmology) Milus Banister, MD as Consulting Physician (Gastroenterology)   Past Medical History:  Diagnosis Date  . Bipolar disorder ([place])    pt denies  . COPD (chronic obstructive pulmonary disease) ([place])   . Depression   . Diverticulitis   . Diverticulitis   . Fibromyalgia   . Gallstones 11/09/2015  . GERD (gastroesophageal reflux disease)   . Headache   . Hyperlipidemia   . Hypertension   . Osteoarthritis     Past Surgical History:  Procedure Laterality Date  . CHOLECYSTECTOMY N/A 12/04/2015   Procedure: LAPAROSCOPIC CHOLECYSTECTOMY WITH INTRAOPERATIVE CHOLANGIOGRAM;  Surgeon: Fanny Skates, MD;  Location: [facility];  Service: General;  Laterality: N/A;  . COLON RESECTION  2001   sigmoid colon removed  . COLOSTOMY CLOSURE  2011  . FEMUR FRACTURE SURGERY  09/2009  . JOINT REPLACEMENT Right 09/2009   R hip,  redone, 06/14/2010 [place],---  done  secondary to fractured fractured x2  . TONSILLECTOMY AND ADENOIDECTOMY    . TOTAL HIP ARTHROPLASTY Left    1979 and 1986    Family History  Problem Relation Age of Onset  . Stroke Mother   . Arthritis      mother family  . Depression      mother fanily  . Alcohol abuse      Both sides  . Cancer      mothers side  . Stroke Father   . Achalasia Father   . Colon cancer Neg Hx     Social History   Social History  . Marital status: Single    Spouse name: N/A  . Number of children: 1  . Years of education: N/A   Occupational History  . retired    Social History Main Topics  . Smoking status: Current Every Day Smoker    Last attempt to quit: 06/18/2011  . Smokeless tobacco: Never Used     Comment: marijuana,  daily for years   . Alcohol use Yes     Comment: 2-3 bottles of wine/week  . Drug use: Yes    Frequency: 14.0 times per week    Types: Marijuana     Comment: - daily use  . Sexual activity: Not Currently    Partners: Male   Other Topics Concern  . Not on file   Social History Narrative  Lives alone in a one story home.  Retired from different jobs.  Education: college.  Has one child.     Outpatient Medications Prior to Visit  Medication Sig Dispense Refill  . ALPRAZolam (XANAX) 1 MG tablet TAKE 1/2 TABLET BY MOUTH THREE TIMES DAILY AS NEEDED--- dr Casimiro Needle to take over 30 tablet 0  . Ascorbic Acid (VITAMIN C PO) Take by mouth.    . B-COMPLEX CAPS Take 1 capsule by mouth daily.    . Biotin 1000 MCG tablet Take 5,000 mcg by mouth daily.     . Calcium Citrate (CITRACAL PO) Take 1 tablet by mouth daily.    . cholecalciferol (VITAMIN D) 1000 UNITS tablet Take 1,000 Units by mouth daily.     . cyanocobalamin 1000 MCG tablet Take 2,000 mcg by mouth daily.     . DULoxetine (CYMBALTA) 30 MG capsule Take 30 mg by mouth daily.    . Flaxseed, Linseed, (FLAX SEED OIL PO) Take 1 capsule by mouth daily.    Marland Kitchen gabapentin (NEURONTIN) 600 MG tablet TAKE 1 TABLET BY MOUTH  THREE TIMES DAILY 90 tablet 5  . ibuprofen (ADVIL,MOTRIN) 200 MG tablet Take 1 tablet (200 mg total) by mouth every 6 (six) hours as needed for moderate pain. 30 tablet 0  . lamoTRIgine (LAMICTAL) 100 MG tablet TAKE 1 TABLET BY MOUTH TWICE DAILY 60 tablet 5  . Magnesium Citrate 100 MG TABS Take 1 tablet by mouth daily.    . Melatonin 10 MG CAPS Take 1 capsule by mouth daily.    . Multiple Vitamin (MULTIVITAMIN) tablet Take 1 tablet by mouth daily.    . ranitidine (ZANTAC) 150 MG tablet Take 150 mg by mouth 2 (two) times daily.    . Selenium (SELENIMIN PO) Take 1 tablet by mouth daily.    . temazepam (RESTORIL) 15 MG capsule Take 30 mg by mouth at bedtime.   1  . tiZANidine (ZANAFLEX) 4 MG tablet TAKE 1 TO 2 TABLETS(4 TO 8 MG) BY MOUTH EVERY 6 HOURS AS NEEDED (Patient taking differently: TAKE 1 TO 2 TABLETS(4 TO 8 MG) BY MOUTH EVERY 6 HOURS AS NEEDED for muscle spamss) 30 tablet 0  . Valerian 450 MG CAPS Take 1 capsule by mouth daily.    . vitamin E 400 UNIT capsule Take 400 Units by mouth daily.     Facility-Administered Medications Prior to Visit  Medication Dose Route Frequency Provider Last Rate Last Dose  . 0.9 %  sodium chloride infusion  500 mL Intravenous Continuous Milus Banister, MD        Allergies  Allergen Reactions  . Levofloxacin Rash    REACTION: RASH/REDNESS    Review of Systems  Constitutional: Negative for fever.  HENT: Negative for congestion.   Eyes: Negative for blurred vision.  Respiratory: Negative for cough.   Cardiovascular: Negative for chest pain and palpitations.  Gastrointestinal: Negative for vomiting.  Musculoskeletal: Negative for back pain.  Skin: Negative for rash.  Neurological: Negative for loss of consciousness and headaches.  Psychiatric/Behavioral: Positive for depression.       Objective:    Physical Exam  Constitutional: She is oriented to person, place, and time. She appears well-developed and well-nourished. No distress.  HENT:    Head: Normocephalic and atraumatic.  Eyes: Conjunctivae are normal. Pupils are equal, round, and reactive to light.  Neck: Normal range of motion. No thyromegaly present.  Cardiovascular: Normal rate and regular rhythm.   Pulmonary/Chest: Effort normal and breath sounds normal.  She has no wheezes.  Abdominal: Soft. Bowel sounds are normal. There is no tenderness.  Musculoskeletal: Normal range of motion. She exhibits no edema or deformity.  Neurological: She is alert and oriented to person, place, and time.  Skin: Skin is warm and dry. She is not diaphoretic.  Psychiatric: She has a normal mood and affect.    BP (!) 150/82 (BP Location: Left Arm, Patient Position: Sitting, Cuff Size: Normal)   Pulse 87   Temp 98.4 F (36.9 C) (Oral)   Resp 16   Ht 5\' 5"  (1.651 m)   Wt 132 lb 9.6 oz (60.1 kg)   SpO2 98%   BMI 22.07 kg/m  Wt Readings from Last 3 Encounters:  04/01/16 132 lb 9.6 oz (60.1 kg)  12/04/15 136 lb (61.7 kg)  12/01/15 136 lb (61.7 kg)     Lab Results  Component Value Date   WBC 9.6 11/29/2015   HGB 14.8 11/29/2015   HCT 41.8 11/29/2015   PLT 293 11/29/2015   GLUCOSE 108 (H) 11/29/2015   CHOL 240 (H) 10/16/2015   TRIG 113.0 10/16/2015   HDL 71.50 10/16/2015   LDLDIRECT 162.3 05/11/2012   LDLCALC 146 (H) 10/16/2015   ALT 10 (L) 11/29/2015   AST 22 11/29/2015   NA 138 11/29/2015   K 3.8 11/29/2015   CL 101 11/29/2015   CREATININE 0.87 11/29/2015   BUN 9 11/29/2015   CO2 27 11/29/2015   TSH 1.22 12/17/2012   INR 1.05 09/30/2009   HGBA1C 5.7 04/14/2007    Lab Results  Component Value Date   TSH 1.22 12/17/2012   Lab Results  Component Value Date   WBC 9.6 11/29/2015   HGB 14.8 11/29/2015   HCT 41.8 11/29/2015   MCV 92.5 11/29/2015   PLT 293 11/29/2015   Lab Results  Component Value Date   NA 138 11/29/2015   K 3.8 11/29/2015   CO2 27 11/29/2015   GLUCOSE 108 (H) 11/29/2015   BUN 9 11/29/2015   CREATININE 0.87 11/29/2015   BILITOT 0.7  11/29/2015   ALKPHOS 51 11/29/2015   AST 22 11/29/2015   ALT 10 (L) 11/29/2015   PROT 7.8 11/29/2015   ALBUMIN 4.8 11/29/2015   CALCIUM 10.8 (H) 11/29/2015   ANIONGAP 10 11/29/2015   GFR 75.53 08/24/2015   Lab Results  Component Value Date   CHOL 240 (H) 10/16/2015   Lab Results  Component Value Date   HDL 71.50 10/16/2015   Lab Results  Component Value Date   LDLCALC 146 (H) 10/16/2015   Lab Results  Component Value Date   TRIG 113.0 10/16/2015   Lab Results  Component Value Date   CHOLHDL 3 10/16/2015   Lab Results  Component Value Date   HGBA1C 5.7 04/14/2007       Assessment & Plan:   Problem List Items Addressed This Visit      Unprioritized   Hyperlipidemia   Relevant Medications   lisinopril (PRINIVIL,ZESTRIL) 10 MG tablet   Essential hypertension - Primary   Relevant Medications   lisinopril (PRINIVIL,ZESTRIL) 10 MG tablet    Other Visit Diagnoses    Change in mole       Relevant Orders   Ambulatory referral to Dermatology   Trigger finger of left thumb       Relevant Orders   AMB referral to orthopedics   Screening for malignant neoplasm of breast       Relevant Orders   MM Digital Screening  Asymptomatic postmenopausal state       Relevant Orders   DG Bone Density   Encounter for Medicare annual wellness exam          I am having Ms. Durand start on lisinopril. I am also having her maintain her multivitamin, cholecalciferol, cyanocobalamin, Biotin, B-Complex, Ascorbic Acid (VITAMIN C PO), temazepam, (Flaxseed, Linseed, (FLAX SEED OIL PO)), Valerian, Melatonin, Magnesium Citrate, Calcium Citrate (CITRACAL PO), gabapentin, lamoTRIgine, tiZANidine, Selenium (SELENIMIN PO), DULoxetine, ranitidine, vitamin E, ALPRAZolam, ibuprofen, and venlafaxine XR. We will continue to administer sodium chloride.  Meds ordered this encounter  Medications  . venlafaxine XR (EFFEXOR-XR) 75 MG 24 hr capsule    Sig: Take 75 mg by mouth daily.  Marland Kitchen lisinopril  (PRINIVIL,ZESTRIL) 10 MG tablet    Sig: Take 1 tablet (10 mg total) by mouth daily.    Dispense:  90 tablet    Refill:  3    CMA served as scribe during this visit. History, Physical and Plan performed by medical provider. Documentation and orders reviewed and attested to.  Ann Held, DO   Patient ID: Toni [name], female   DOB: 02/01/1945, 71 y.o.   MRN: [id number]

## 2016-04-04 ENCOUNTER — Ambulatory Visit (INDEPENDENT_AMBULATORY_CARE_PROVIDER_SITE_OTHER): Payer: Medicare Other | Admitting: Orthopaedic Surgery

## 2016-04-04 DIAGNOSIS — D229 Melanocytic nevi, unspecified: Secondary | ICD-10-CM | POA: Diagnosis not present

## 2016-04-04 DIAGNOSIS — L814 Other melanin hyperpigmentation: Secondary | ICD-10-CM | POA: Diagnosis not present

## 2016-04-04 DIAGNOSIS — D1800 Hemangioma unspecified site: Secondary | ICD-10-CM | POA: Diagnosis not present

## 2016-04-04 DIAGNOSIS — L821 Other seborrheic keratosis: Secondary | ICD-10-CM | POA: Diagnosis not present

## 2016-04-04 DIAGNOSIS — M65312 Trigger thumb, left thumb: Secondary | ICD-10-CM | POA: Diagnosis not present

## 2016-04-04 MED ORDER — METHYLPREDNISOLONE ACETATE 40 MG/ML IJ SUSP
40.0000 mg | INTRAMUSCULAR | Status: AC | PRN
  Administered 2016-04-04: 40 mg

## 2016-04-04 NOTE — Progress Notes (Signed)
Office Visit Note   Patient: Toni [name]           Date of Birth: 09-24-1945           MRN: [id number] Visit Date: 04/04/2016              Requested by: Ann Held, DO [address] [place], [place] [address] PCP: Ann Held, DO   Assessment & Plan: Visit Diagnoses:  1. Trigger thumb of left hand     Plan: I explained to her what trigger finger means and about the pathology of the A1 pulley. Offered a steroid injection over this area and she agreed to this. She tolerated the steroid injection well. I'll see her back as needed. She understands I would at least easily perform another injection in a month if her symptoms have not resolved. She'll let us know.  Follow-Up Instructions: Return if symptoms worsen or fail to improve.   Orders:  No orders of the defined types were placed in this encounter.  No orders of the defined types were placed in this encounter.     Procedures: Hand/UE Inj Date/Time: 04/04/2016 4:46 PM Performed by: Mcarthur Rossetti Authorized by: Mcarthur Rossetti   Condition: trigger finger   Location:  Thumb Site:  L thumb A1 Medications:  40 mg methylPREDNISolone acetate 40 MG/ML     Clinical Data: No additional findings.   Subjective: No chief complaint on file. Patient is a very pleasant 71 year old right-hand-dominant female who comes in with acute left thumb pain and triggering. She points over the A1 pulleys source for pain. She said this is slowly came on of her last almost 2 weeks. She denies a nubs and tingling. She denies any trauma. She's never had anything like this before she states.  HPI  Review of Systems Has any chest pain, headache, shortness of breath, fever, chills, nausea, vomiting.  Objective: Vital Signs: There were no vitals taken for this visit.  Physical Exam He is alert and oriented 3 and in no acute distress Ortho Exam Exanation of her left hand shows pain over the  A1 pulley of the thumb. She also has active triggering. She is neurovascularly intact. Specialty Comments:  No specialty comments available.  Imaging: No results found.   PMFS History: Patient Active Problem List   Diagnosis Date Noted  . Pre-operative cardiovascular examination 12/01/2015  . Gallstones 11/09/2015  . Diverticulitis of colon 08/24/2015  . Severe depression ([place]) 08/08/2014  . Dyspnea on effort 01/11/2013  . Chest discomfort 01/11/2013  . Marijuana abuse 04/17/2011  . UNSPECIFIED VITAMIN D DEFICIENCY 02/08/2010  . UNSPECIFIED ANEMIA 02/08/2010  . CLOSED FRACTURE OF SHAFT OF FEMUR 02/08/2010  . ALCOHOL ABUSE, IN REMISSION, HX OF 09/30/2009  . UNSPECIFIED CONJUNCTIVITIS 09/13/2009  . WEAKNESS 10/10/2008  . HEARTBURN 03/25/2008  . GERD 03/24/2008  . OTHER DYSPHAGIA 03/24/2008  . MUSCLE PAIN 01/07/2008  . FOOT PAIN, LEFT 01/07/2008  . TINEA CORPORIS 01/04/2008  . Essential hypertension 01/04/2008  . LOW BACK PAIN, ACUTE 12/13/2006  . CERVICAL STRAIN 12/13/2006  . POSTMENOPAUSAL STATUS 12/09/2006  . Hyperlipidemia 12/08/2006  . Bipolar 1 disorder ([place]) 12/08/2006  . COPD 12/08/2006  . OSTEOARTHRITIS 12/08/2006  . Personal history of other diseases of digestive system 12/08/2006  . TONSILLECTOMY AND ADENOIDECTOMY, HX OF 12/08/2006   Past Medical History:  Diagnosis Date  . Bipolar disorder ([place])    pt denies  . COPD (chronic obstructive pulmonary disease) (  [place])   . Depression   . Diverticulitis   . Diverticulitis   . Fibromyalgia   . Gallstones 11/09/2015  . GERD (gastroesophageal reflux disease)   . Headache   . Hyperlipidemia   . Hypertension   . Osteoarthritis     Family History  Problem Relation Age of Onset  . Stroke Mother   . Arthritis      mother family  . Depression      mother fanily  . Alcohol abuse      Both sides  . Cancer      mothers side  . Stroke Father   . Achalasia Father   . Colon cancer Neg Hx     Past Surgical  History:  Procedure Laterality Date  . CHOLECYSTECTOMY N/A 12/04/2015   Procedure: LAPAROSCOPIC CHOLECYSTECTOMY WITH INTRAOPERATIVE CHOLANGIOGRAM;  Surgeon: Fanny Skates, MD;  Location: [facility];  Service: General;  Laterality: N/A;  . COLON RESECTION  2001   sigmoid colon removed  . COLOSTOMY CLOSURE  2011  . FEMUR FRACTURE SURGERY  09/2009  . JOINT REPLACEMENT Right 09/2009   R hip,  redone, 06/14/2010 [place],---  done secondary to fractured fractured x2  . TONSILLECTOMY AND ADENOIDECTOMY    . TOTAL HIP ARTHROPLASTY Left    1979 and 1986   Social History   Occupational History  . retired    Social History Main Topics  . Smoking status: Current Every Day Smoker    Last attempt to quit: 06/18/2011  . Smokeless tobacco: Never Used     Comment: marijuana,  daily for years   . Alcohol use Yes     Comment: 2-3 bottles of wine/week  . Drug use: Yes    Frequency: 14.0 times per week    Types: Marijuana     Comment: - daily use  . Sexual activity: Not Currently    Partners: Male

## 2016-04-05 ENCOUNTER — Telehealth: Payer: Self-pay | Admitting: Family Medicine

## 2016-04-05 NOTE — Telephone Encounter (Signed)
Caller name: Relationship to patient: Self Can be reached: [phone] Pharmacy:  Reason for call: Request call back because she has questions about medication

## 2016-04-05 NOTE — Telephone Encounter (Signed)
She would like to know if lisinopril is ok to take if she drinks Alcohol. She states she drinks about 2 to 3 bottles of wine weekly, that would be the max. Note: She will not start the lisinopril until she hears back from Dr. Etter Sjogren if ok or not.

## 2016-04-05 NOTE — Telephone Encounter (Signed)
Ok to take lisinopril

## 2016-04-08 NOTE — Telephone Encounter (Signed)
Patient informed of PCP instructions. 

## 2016-04-11 ENCOUNTER — Other Ambulatory Visit: Payer: Self-pay | Admitting: Family

## 2016-04-11 DIAGNOSIS — F331 Major depressive disorder, recurrent, moderate: Secondary | ICD-10-CM | POA: Diagnosis not present

## 2016-04-17 DIAGNOSIS — F3341 Major depressive disorder, recurrent, in partial remission: Secondary | ICD-10-CM | POA: Diagnosis not present

## 2016-04-17 DIAGNOSIS — F331 Major depressive disorder, recurrent, moderate: Secondary | ICD-10-CM | POA: Diagnosis not present

## 2016-04-19 ENCOUNTER — Ambulatory Visit: Payer: Medicare Other | Admitting: Family Medicine

## 2016-04-22 ENCOUNTER — Ambulatory Visit: Payer: Medicare Other | Admitting: Family Medicine

## 2016-04-24 DIAGNOSIS — F331 Major depressive disorder, recurrent, moderate: Secondary | ICD-10-CM | POA: Diagnosis not present

## 2016-04-26 ENCOUNTER — Other Ambulatory Visit: Payer: Self-pay | Admitting: Family Medicine

## 2016-04-26 DIAGNOSIS — Z1231 Encounter for screening mammogram for malignant neoplasm of breast: Secondary | ICD-10-CM

## 2016-04-30 DIAGNOSIS — F331 Major depressive disorder, recurrent, moderate: Secondary | ICD-10-CM | POA: Diagnosis not present

## 2016-04-30 DIAGNOSIS — F419 Anxiety disorder, unspecified: Secondary | ICD-10-CM | POA: Diagnosis not present

## 2016-05-03 ENCOUNTER — Ambulatory Visit (INDEPENDENT_AMBULATORY_CARE_PROVIDER_SITE_OTHER): Payer: Medicare Other | Admitting: Family Medicine

## 2016-05-03 ENCOUNTER — Encounter: Payer: Self-pay | Admitting: Family Medicine

## 2016-05-03 VITALS — BP 140/82 | HR 81 | Temp 98.6°F | Resp 16 | Ht 65.0 in | Wt 132.0 lb

## 2016-05-03 DIAGNOSIS — J302 Other seasonal allergic rhinitis: Secondary | ICD-10-CM

## 2016-05-03 DIAGNOSIS — E782 Mixed hyperlipidemia: Secondary | ICD-10-CM | POA: Diagnosis not present

## 2016-05-03 DIAGNOSIS — I10 Essential (primary) hypertension: Secondary | ICD-10-CM | POA: Diagnosis not present

## 2016-05-03 DIAGNOSIS — F1210 Cannabis abuse, uncomplicated: Secondary | ICD-10-CM

## 2016-05-03 DIAGNOSIS — Z0000 Encounter for general adult medical examination without abnormal findings: Secondary | ICD-10-CM | POA: Diagnosis not present

## 2016-05-03 LAB — COMPREHENSIVE METABOLIC PANEL
ALBUMIN: 4.5 g/dL (ref 3.6–5.1)
ALT: 11 U/L (ref 6–29)
AST: 16 U/L (ref 10–35)
Alkaline Phosphatase: 50 U/L (ref 33–130)
BUN: 16 mg/dL (ref 7–25)
CHLORIDE: 98 mmol/L (ref 98–110)
CO2: 26 mmol/L (ref 20–31)
Calcium: 9.6 mg/dL (ref 8.6–10.4)
Creat: 0.67 mg/dL (ref 0.50–0.99)
Glucose, Bld: 99 mg/dL (ref 65–99)
POTASSIUM: 4.6 mmol/L (ref 3.5–5.3)
Sodium: 132 mmol/L — ABNORMAL LOW (ref 135–146)
TOTAL PROTEIN: 6.8 g/dL (ref 6.1–8.1)
Total Bilirubin: 0.4 mg/dL (ref 0.2–1.2)

## 2016-05-03 LAB — CBC
HCT: 39.3 % (ref 35.0–45.0)
HEMOGLOBIN: 13.6 g/dL (ref 11.7–15.5)
MCH: 32.5 pg (ref 27.0–33.0)
MCHC: 34.6 g/dL (ref 32.0–36.0)
MCV: 93.8 fL (ref 80.0–100.0)
MPV: 8.2 fL (ref 7.5–12.5)
PLATELETS: 278 10*3/uL (ref 140–400)
RBC: 4.19 MIL/uL (ref 3.80–5.10)
RDW: 13.6 % (ref 11.0–15.0)
WBC: 7.1 10*3/uL (ref 3.8–10.8)

## 2016-05-03 LAB — POC URINALSYSI DIPSTICK (AUTOMATED)
Bilirubin, UA: NEGATIVE
Blood, UA: NEGATIVE
Glucose, UA: NEGATIVE
KETONES UA: NEGATIVE
Leukocytes, UA: NEGATIVE
Nitrite, UA: NEGATIVE
PROTEIN UA: NEGATIVE
Spec Grav, UA: 1.02 (ref 1.030–1.035)
Urobilinogen, UA: NEGATIVE (ref ?–2.0)
pH, UA: 6 (ref 5.0–8.0)

## 2016-05-03 LAB — LIPID PANEL
CHOL/HDL RATIO: 3.1 ratio (ref <5.0)
CHOLESTEROL: 232 mg/dL — AB (ref <200)
HDL: 76 mg/dL (ref >50)
LDL Cholesterol: 141 mg/dL — ABNORMAL HIGH (ref <100)
Triglycerides: 75 mg/dL (ref <150)
VLDL: 15 mg/dL (ref <30)

## 2016-05-03 LAB — TSH: TSH: 1.32 mIU/L

## 2016-05-03 MED ORDER — LORATADINE 10 MG PO TABS: 10.0000 mg | ORAL_TABLET | Freq: Every day | ORAL | 11 refills | Status: DC

## 2016-05-03 MED ORDER — LISINOPRIL-HYDROCHLOROTHIAZIDE 20-25 MG PO TABS: 1.0000 | ORAL_TABLET | Freq: Every day | ORAL | 3 refills | Status: DC

## 2016-05-03 NOTE — Patient Instructions (Signed)

## 2016-05-03 NOTE — Progress Notes (Signed)
Pre visit review using our clinic review tool, if applicable. No additional management support is needed unless otherwise documented below in the visit note. 

## 2016-05-03 NOTE — Progress Notes (Signed)
Subjective:  I acted as a Education administrator for Dr. Royden Purl, LPN    Patient ID: Toni [name], female    DOB: Jun 28, 1945, 71 y.o.   MRN: [id number]  Chief Complaint  Patient presents with  . Hypertension    follow up    Hypertension  This is a new problem. The current episode started more than 1 month ago. Pertinent negatives include no blurred vision, chest pain, headaches or palpitations.    Patient is in today for follow up hypertension. Patient report she has been keeping track of her blood pressure at home, at it has been a little elevated. Patient also report she has a continuous headache. Patient she has been experiencing swelling in her legs. Patient report her appetite is not that good.    Patient Care Team: Ann Held, DO as PCP - General Norma Fredrickson, MD as Consulting Physician (Psychiatry) Norma Fredrickson, MD as Consulting Physician (Psychiatry) Ralene Bathe, MD as Consulting Physician (Ophthalmology) Milus Banister, MD as Consulting Physician (Gastroenterology)   Past Medical History:  Diagnosis Date  . Bipolar disorder ([place])    pt denies  . COPD (chronic obstructive pulmonary disease) ([place])   . Depression   . Diverticulitis   . Diverticulitis   . Fibromyalgia   . Gallstones 11/09/2015  . GERD (gastroesophageal reflux disease)   . Headache   . Hyperlipidemia   . Hypertension   . Osteoarthritis     Past Surgical History:  Procedure Laterality Date  . CHOLECYSTECTOMY N/A 12/04/2015   Procedure: LAPAROSCOPIC CHOLECYSTECTOMY WITH INTRAOPERATIVE CHOLANGIOGRAM;  Surgeon: Fanny Skates, MD;  Location: [facility];  Service: General;  Laterality: N/A;  . COLON RESECTION  2001   sigmoid colon removed  . COLOSTOMY CLOSURE  2011  . FEMUR FRACTURE SURGERY  09/2009  . JOINT REPLACEMENT Right 09/2009   R hip,  redone, 06/14/2010 [place],---  done secondary to fractured fractured x2  . TONSILLECTOMY AND ADENOIDECTOMY    . TOTAL HIP ARTHROPLASTY Left    1979 and 1986    Family History  Problem Relation Age of Onset  . Stroke Mother   . Arthritis      mother family  . Depression      mother fanily  . Alcohol abuse      Both sides  . Cancer      mothers side  . Stroke Father   . Achalasia Father   . Colon cancer Neg Hx     Social History   Social History  . Marital status: Single    Spouse name: N/A  . Number of children: 1  . Years of education: N/A   Occupational History  . retired    Social History Main Topics  . Smoking status: Current Every Day Smoker    Last attempt to quit: 06/18/2011  . Smokeless tobacco: Never Used     Comment: marijuana,  daily for years   . Alcohol use Yes     Comment: 2-3 bottles of wine/week  . Drug use: Yes    Frequency: 14.0 times per week    Types: Marijuana     Comment: - daily use  . Sexual activity: Not Currently    Partners: Male   Other Topics Concern  . Not on file   Social History Narrative   Lives alone in a one story home.  Retired from different jobs.  Education: college.  Has one child.     Outpatient Medications Prior to Visit  Medication Sig Dispense Refill  . ALPRAZolam (XANAX) 1 MG tablet TAKE 1/2 TABLET BY MOUTH THREE TIMES DAILY AS NEEDED--- dr Casimiro Needle to take over 30 tablet 0  . Ascorbic Acid (VITAMIN C PO) Take by mouth.    . B-COMPLEX CAPS Take 1 capsule by mouth daily.    . Biotin 1000 MCG tablet Take 5,000 mcg by mouth daily.     . Calcium Citrate (CITRACAL PO) Take 1 tablet by mouth daily.    . cholecalciferol (VITAMIN D) 1000 UNITS tablet Take 1,000 Units by mouth daily.     . cyanocobalamin 1000 MCG tablet Take 2,000 mcg by mouth daily.     . DULoxetine (CYMBALTA) 30 MG capsule Take 30 mg by mouth daily.    . Flaxseed, Linseed, (FLAX SEED OIL PO) Take 1 capsule by mouth daily.    Marland Kitchen gabapentin (NEURONTIN) 600 MG tablet TAKE 1 TABLET BY MOUTH THREE TIMES DAILY 90 tablet 5  . ibuprofen (ADVIL,MOTRIN) 200 MG tablet Take 1 tablet (200 mg total) by mouth  every 6 (six) hours as needed for moderate pain. 30 tablet 0  . lamoTRIgine (LAMICTAL) 100 MG tablet TAKE 1 TABLET BY MOUTH TWICE DAILY 60 tablet 0  . lisinopril (PRINIVIL,ZESTRIL) 10 MG tablet Take 1 tablet (10 mg total) by mouth daily. 90 tablet 3  . Magnesium Citrate 100 MG TABS Take 1 tablet by mouth daily.    . Melatonin 10 MG CAPS Take 1 capsule by mouth daily.    . Multiple Vitamin (MULTIVITAMIN) tablet Take 1 tablet by mouth daily.    . ranitidine (ZANTAC) 150 MG tablet Take 150 mg by mouth 2 (two) times daily.    . Selenium (SELENIMIN PO) Take 1 tablet by mouth daily.    . temazepam (RESTORIL) 15 MG capsule Take 30 mg by mouth at bedtime.   1  . tiZANidine (ZANAFLEX) 4 MG tablet TAKE 1 TO 2 TABLETS(4 TO 8 MG) BY MOUTH EVERY 6 HOURS AS NEEDED (Patient taking differently: TAKE 1 TO 2 TABLETS(4 TO 8 MG) BY MOUTH EVERY 6 HOURS AS NEEDED for muscle spamss) 30 tablet 0  . Valerian 450 MG CAPS Take 1 capsule by mouth daily.    Marland Kitchen venlafaxine XR (EFFEXOR-XR) 75 MG 24 hr capsule Take 75 mg by mouth daily.    . vitamin E 400 UNIT capsule Take 400 Units by mouth daily.     Facility-Administered Medications Prior to Visit  Medication Dose Route Frequency Provider Last Rate Last Dose  . 0.9 %  sodium chloride infusion  500 mL Intravenous Continuous Milus Banister, MD        Allergies  Allergen Reactions  . Levofloxacin Rash    REACTION: RASH/REDNESS    Review of Systems  Constitutional: Negative for fever.  HENT: Negative for congestion.   Eyes: Negative for blurred vision.  Respiratory: Negative for cough.   Cardiovascular: Negative for chest pain and palpitations.  Gastrointestinal: Negative for vomiting.  Musculoskeletal: Negative for back pain.  Skin: Negative for rash.  Neurological: Negative for loss of consciousness and headaches.       Objective:    Physical Exam  Constitutional: She is oriented to person, place, and time. She appears well-developed and well-nourished. No  distress.  HENT:  Head: Normocephalic and atraumatic.  Eyes: Conjunctivae are normal. Pupils are equal, round, and reactive to light.  Neck: Normal range of motion. No thyromegaly present.  Cardiovascular: Normal rate and regular rhythm.   Pulmonary/Chest: Effort normal and breath  sounds normal. She has no wheezes.  Abdominal: Soft. Bowel sounds are normal. There is no tenderness.  Musculoskeletal: Normal range of motion. She exhibits no edema or deformity.  Neurological: She is alert and oriented to person, place, and time.  Skin: Skin is warm and dry. She is not diaphoretic.  Psychiatric: She has a normal mood and affect.    BP 140/82 (BP Location: Left Arm, Patient Position: Sitting, Cuff Size: Normal)   Pulse 81   Temp 98.6 F (37 C) (Oral)   Resp 16   Ht 5\' 5"  (1.651 m)   Wt 132 lb (59.9 kg)   SpO2 98%   BMI 21.97 kg/m  Wt Readings from Last 3 Encounters:  05/03/16 132 lb (59.9 kg)  04/01/16 132 lb 9.6 oz (60.1 kg)  12/04/15 136 lb (61.7 kg)   BP Readings from Last 3 Encounters:  05/03/16 140/82  04/01/16 (!) 150/82  12/04/15 (!) 143/67     Immunization History  Administered Date(s) Administered  . Influenza Whole 11/09/2008, 11/29/2010  . Influenza-Unspecified 10/29/2015  . Pneumococcal Conjugate-13 08/08/2014  . Pneumococcal Polysaccharide-23 05/11/2012    Health Maintenance  Topic Date Due  . TETANUS/TDAP  05/18/1965  . MAMMOGRAM  06/30/2016  . INFLUENZA VACCINE  08/28/2016  . COLONOSCOPY  10/05/2020  . DEXA SCAN  Completed  . Hepatitis C Screening  Completed  . PNA vac Low Risk Adult  Completed    Lab Results  Component Value Date   WBC 7.1 05/03/2016   HGB 13.6 05/03/2016   HCT 39.3 05/03/2016   PLT 278 05/03/2016   GLUCOSE 99 05/03/2016   CHOL 232 (H) 05/03/2016   TRIG 75 05/03/2016   HDL 76 05/03/2016   LDLDIRECT 162.3 05/11/2012   LDLCALC 141 (H) 05/03/2016   ALT 11 05/03/2016   AST 16 05/03/2016   NA 132 (L) 05/03/2016   K 4.6  05/03/2016   CL 98 05/03/2016   CREATININE 0.67 05/03/2016   BUN 16 05/03/2016   CO2 26 05/03/2016   TSH 1.32 05/03/2016   INR 1.05 09/30/2009   HGBA1C 5.7 04/14/2007    Lab Results  Component Value Date   TSH 1.32 05/03/2016   Lab Results  Component Value Date   WBC 7.1 05/03/2016   HGB 13.6 05/03/2016   HCT 39.3 05/03/2016   MCV 93.8 05/03/2016   PLT 278 05/03/2016   Lab Results  Component Value Date   NA 132 (L) 05/03/2016   K 4.6 05/03/2016   CO2 26 05/03/2016   GLUCOSE 99 05/03/2016   BUN 16 05/03/2016   CREATININE 0.67 05/03/2016   BILITOT 0.4 05/03/2016   ALKPHOS 50 05/03/2016   AST 16 05/03/2016   ALT 11 05/03/2016   PROT 6.8 05/03/2016   ALBUMIN 4.5 05/03/2016   CALCIUM 9.6 05/03/2016   ANIONGAP 10 11/29/2015   GFR 75.53 08/24/2015   Lab Results  Component Value Date   CHOL 232 (H) 05/03/2016   Lab Results  Component Value Date   HDL 76 05/03/2016   Lab Results  Component Value Date   LDLCALC 141 (H) 05/03/2016   Lab Results  Component Value Date   TRIG 75 05/03/2016   Lab Results  Component Value Date   CHOLHDL 3.1 05/03/2016   Lab Results  Component Value Date   HGBA1C 5.7 04/14/2007         Assessment & Plan:   Problem List Items Addressed This Visit      Unprioritized   Essential  hypertension - Primary    Well controlled, no changes to meds. Encouraged heart healthy diet such as the DASH diet and exercise as tolerated.       Relevant Medications   lisinopril-hydrochlorothiazide (PRINZIDE,ZESTORETIC) 20-25 MG tablet   Other Relevant Orders   CBC (Completed)   Comprehensive metabolic panel (Completed)   TSH (Completed)   POCT Urinalysis Dipstick (Automated) (Completed)   Hyperlipidemia    Encouraged heart healthy diet, increase exercise, avoid trans fats, consider a krill oil cap daily      Relevant Medications   lisinopril-hydrochlorothiazide (PRINZIDE,ZESTORETIC) 20-25 MG tablet   Other Relevant Orders   Lipid  panel (Completed)   POCT Urinalysis Dipstick (Automated) (Completed)   Marijuana abuse    Pt is cutting down and feels better       Other Visit Diagnoses    Preventative health care       Seasonal allergic rhinitis, unspecified chronicity, unspecified trigger       Relevant Medications   loratadine (CLARITIN) 10 MG tablet      I am having Ms. Penalver start on lisinopril-hydrochlorothiazide and loratadine. I am also having her maintain her multivitamin, cholecalciferol, cyanocobalamin, Biotin, B-Complex, Ascorbic Acid (VITAMIN C PO), temazepam, (Flaxseed, Linseed, (FLAX SEED OIL PO)), Valerian, Melatonin, Magnesium Citrate, Calcium Citrate (CITRACAL PO), gabapentin, tiZANidine, Selenium (SELENIMIN PO), DULoxetine, ranitidine, vitamin E, ALPRAZolam, ibuprofen, venlafaxine XR, lisinopril, and lamoTRIgine. We will continue to administer sodium chloride.  Meds ordered this encounter  Medications  . lisinopril-hydrochlorothiazide (PRINZIDE,ZESTORETIC) 20-25 MG tablet    Sig: Take 1 tablet by mouth daily.    Dispense:  90 tablet    Refill:  3  . loratadine (CLARITIN) 10 MG tablet    Sig: Take 1 tablet (10 mg total) by mouth daily.    Dispense:  30 tablet    Refill:  11    CMA served as scribe during this visit. History, Physical and Plan performed by medical provider. Documentation and orders reviewed and attested to.  Ann Held, DO   Patient ID: Toni [name], female   DOB: 1945-09-15, 71 y.o.   MRN: [id number]

## 2016-05-04 NOTE — Assessment & Plan Note (Signed)
Well controlled, no changes to meds. Encouraged heart healthy diet such as the DASH diet and exercise as tolerated.  °

## 2016-05-04 NOTE — Assessment & Plan Note (Signed)
Pt is cutting down and feels better

## 2016-05-04 NOTE — Assessment & Plan Note (Signed)
Encouraged heart healthy diet, increase exercise, avoid trans fats, consider a krill oil cap daily 

## 2016-05-06 ENCOUNTER — Other Ambulatory Visit: Payer: Self-pay | Admitting: Family Medicine

## 2016-05-06 DIAGNOSIS — F331 Major depressive disorder, recurrent, moderate: Secondary | ICD-10-CM | POA: Diagnosis not present

## 2016-05-06 DIAGNOSIS — F419 Anxiety disorder, unspecified: Secondary | ICD-10-CM | POA: Diagnosis not present

## 2016-05-06 DIAGNOSIS — E785 Hyperlipidemia, unspecified: Secondary | ICD-10-CM

## 2016-05-16 ENCOUNTER — Other Ambulatory Visit: Payer: Self-pay | Admitting: Family Medicine

## 2016-05-17 ENCOUNTER — Ambulatory Visit
Admission: RE | Admit: 2016-05-17 | Discharge: 2016-05-17 | Disposition: A | Discharge status: Home / Self Care | Payer: Medicare Other | Source: Ambulatory Visit | Attending: Family Medicine | Admitting: Family Medicine

## 2016-05-17 DIAGNOSIS — Z1231 Encounter for screening mammogram for malignant neoplasm of breast: Secondary | ICD-10-CM

## 2016-05-17 DIAGNOSIS — Z13820 Encounter for screening for osteoporosis: Secondary | ICD-10-CM | POA: Diagnosis not present

## 2016-05-17 DIAGNOSIS — Z780 Asymptomatic menopausal state: Secondary | ICD-10-CM | POA: Diagnosis not present

## 2016-05-17 NOTE — Telephone Encounter (Signed)
Looks like Dr. Casimiro Needle shoulld have taken over Alprazolam.  Will refuse refill.   Would you like to refill her gabapentin.  Last filled 07/12/15 with 5RF.

## 2016-05-19 NOTE — Telephone Encounter (Signed)
Ok to refill gabepentin

## 2016-05-21 ENCOUNTER — Telehealth: Payer: Self-pay | Admitting: Family Medicine

## 2016-05-21 MED ORDER — ALPRAZOLAM 1 MG PO TABS: ORAL_TABLET | ORAL | 0 refills | Status: DC

## 2016-05-21 NOTE — Telephone Encounter (Signed)
Refill x1 

## 2016-05-21 NOTE — Telephone Encounter (Signed)
Requesting:   alprazolam Contract   None UDS    None Last OV    05/03/2016 Last Refill     #30 no refills on 12/01/2015  Please Advise---Pharmacy [facility] [place]---fax number is [phone]

## 2016-05-21 NOTE — Telephone Encounter (Signed)
Faxed hardcopy for Alprazolam to [facility]

## 2016-05-22 DIAGNOSIS — F331 Major depressive disorder, recurrent, moderate: Secondary | ICD-10-CM | POA: Diagnosis not present

## 2016-05-22 DIAGNOSIS — F419 Anxiety disorder, unspecified: Secondary | ICD-10-CM | POA: Diagnosis not present

## 2016-05-30 DIAGNOSIS — F331 Major depressive disorder, recurrent, moderate: Secondary | ICD-10-CM | POA: Diagnosis not present

## 2016-06-05 DIAGNOSIS — F331 Major depressive disorder, recurrent, moderate: Secondary | ICD-10-CM | POA: Diagnosis not present

## 2016-06-06 ENCOUNTER — Encounter: Payer: Self-pay | Admitting: Family Medicine

## 2016-06-06 ENCOUNTER — Ambulatory Visit (INDEPENDENT_AMBULATORY_CARE_PROVIDER_SITE_OTHER): Payer: Medicare Other | Admitting: Family Medicine

## 2016-06-06 ENCOUNTER — Encounter (INDEPENDENT_AMBULATORY_CARE_PROVIDER_SITE_OTHER): Payer: Self-pay

## 2016-06-06 VITALS — BP 110/72 | HR 79 | Temp 98.1°F | Resp 18 | Wt 130.0 lb

## 2016-06-06 DIAGNOSIS — J40 Bronchitis, not specified as acute or chronic: Secondary | ICD-10-CM | POA: Diagnosis not present

## 2016-06-06 DIAGNOSIS — R05 Cough: Secondary | ICD-10-CM | POA: Diagnosis not present

## 2016-06-06 DIAGNOSIS — J301 Allergic rhinitis due to pollen: Secondary | ICD-10-CM | POA: Diagnosis not present

## 2016-06-06 DIAGNOSIS — R059 Cough, unspecified: Secondary | ICD-10-CM

## 2016-06-06 MED ORDER — LISINOPRIL 20 MG PO TABS: 20.0000 mg | ORAL_TABLET | Freq: Every day | ORAL | 3 refills | Status: DC

## 2016-06-06 MED ORDER — GUAIFENESIN ER 600 MG PO TB12: 1200.0000 mg | ORAL_TABLET | Freq: Two times a day (BID) | ORAL | 3 refills | Status: DC | PRN

## 2016-06-06 MED ORDER — AZITHROMYCIN 250 MG PO TABS: ORAL_TABLET | ORAL | 0 refills | Status: DC

## 2016-06-06 MED ORDER — METHYLPREDNISOLONE ACETATE 80 MG/ML IJ SUSP
80.0000 mg | Freq: Once | INTRAMUSCULAR | Status: AC
  Administered 2016-06-06: 80 mg via INTRAMUSCULAR

## 2016-06-06 MED ORDER — PREDNISONE 10 MG PO TABS: ORAL_TABLET | ORAL | 0 refills | Status: DC

## 2016-06-06 MED ORDER — LEVOCETIRIZINE DIHYDROCHLORIDE 5 MG PO TABS: 5.0000 mg | ORAL_TABLET | Freq: Every evening | ORAL | 5 refills | Status: DC

## 2016-06-06 MED FILL — AZITHROMYCIN 250 MG TABLET: 250 | 5 days supply | Qty: 6 | Fill #0

## 2016-06-06 MED FILL — LISINOPRIL 20 MG TABLET: 20 | 90 days supply | Qty: 90 | Fill #0

## 2016-06-06 MED FILL — LEVOCETIRIZINE 5 MG TABLET: 5 | 30 days supply | Qty: 30 | Fill #0

## 2016-06-06 MED FILL — predniSONE 10 MG TABS: 10 | 12 days supply | Qty: 20 | Fill #0

## 2016-06-06 NOTE — Patient Instructions (Signed)

## 2016-06-06 NOTE — Progress Notes (Signed)
Subjective:  I acted as a Education administrator for Dr. Etter Sjogren- Fermin Schwab, RMA   Patient ID: Toni [name], female    DOB: [birth year], 71 y.o.   MRN: [id number]  Chief Complaint  Patient presents with  . chest congestion    Allergry related    HPI  Patient is in today for an acute visit she c/o of chest congestion on going for a few weeks. + wheezing , productive cough and sinus pressure.  She has been taking mucinex and xyzal with no relief.  No fevers.    Patient Care Team: Carollee Herter, Alferd Apa, DO as PCP - General Norma Fredrickson, MD as Consulting Physician (Psychiatry) Norma Fredrickson, MD as Consulting Physician (Psychiatry) Ralene Bathe, MD as Consulting Physician (Ophthalmology) Milus Banister, MD as Consulting Physician (Gastroenterology)   Past Medical History:  Diagnosis Date  . Bipolar disorder ([place])    pt denies  . COPD (chronic obstructive pulmonary disease) ([place])   . Depression   . Diverticulitis   . Diverticulitis   . Fibromyalgia   . Gallstones 11/09/2015  . GERD (gastroesophageal reflux disease)   . Headache   . Hyperlipidemia   . Hypertension   . Osteoarthritis     Past Surgical History:  Procedure Laterality Date  . CHOLECYSTECTOMY N/A 12/04/2015   Procedure: LAPAROSCOPIC CHOLECYSTECTOMY WITH INTRAOPERATIVE CHOLANGIOGRAM;  Surgeon: Fanny Skates, MD;  Location: [facility];  Service: General;  Laterality: N/A;  . COLON RESECTION  2001   sigmoid colon removed  . COLOSTOMY CLOSURE  2011  . FEMUR FRACTURE SURGERY  09/2009  . JOINT REPLACEMENT Right 09/2009   R hip,  redone, 06/14/2010 [place],---  done secondary to fractured fractured x2  . TONSILLECTOMY AND ADENOIDECTOMY    . TOTAL HIP ARTHROPLASTY Left    1979 and 1986    Family History  Problem Relation Age of Onset  . Stroke Mother   . Arthritis Unknown        mother family  . Depression Unknown        mother fanily  . Alcohol abuse Unknown        Both sides  . Cancer Unknown    mothers side  . Stroke Father   . Achalasia Father   . Colon cancer Neg Hx     Social History   Social History  . Marital status: Single    Spouse name: N/A  . Number of children: 1  . Years of education: N/A   Occupational History  . retired    Social History Main Topics  . Smoking status: Current Every Day Smoker    Last attempt to quit: 06/18/2011  . Smokeless tobacco: Never Used     Comment: marijuana,  daily for years   . Alcohol use Yes     Comment: 2-3 bottles of wine/week  . Drug use: Yes    Frequency: 14.0 times per week    Types: Marijuana     Comment: - daily use  . Sexual activity: Not Currently    Partners: Male   Other Topics Concern  . Not on file   Social History Narrative   Lives alone in a one story home.  Retired from different jobs.  Education: college.  Has one child.     Outpatient Medications Prior to Visit  Medication Sig Dispense Refill  . ALPRAZolam (XANAX) 1 MG tablet TAKE 1/2 TABLET BY MOUTH THREE TIMES DAILY AS NEEDED--- dr Casimiro Needle to take over 30 tablet 0  .  Ascorbic Acid (VITAMIN C PO) Take by mouth.    . B-COMPLEX CAPS Take 1 capsule by mouth daily.    . Biotin 1000 MCG tablet Take 5,000 mcg by mouth daily.     . Calcium Citrate (CITRACAL PO) Take 1 tablet by mouth daily.    . cholecalciferol (VITAMIN D) 1000 UNITS tablet Take 1,000 Units by mouth daily.     . cyanocobalamin 1000 MCG tablet Take 2,000 mcg by mouth daily.     . DULoxetine (CYMBALTA) 30 MG capsule Take 30 mg by mouth daily.    . Flaxseed, Linseed, (FLAX SEED OIL PO) Take 1 capsule by mouth daily.    Marland Kitchen gabapentin (NEURONTIN) 600 MG tablet TAKE 1 TABLET BY MOUTH THREE TIMES DAILY 90 tablet 0  . ibuprofen (ADVIL,MOTRIN) 200 MG tablet Take 1 tablet (200 mg total) by mouth every 6 (six) hours as needed for moderate pain. 30 tablet 0  . lamoTRIgine (LAMICTAL) 100 MG tablet TAKE 1 TABLET BY MOUTH TWICE DAILY 60 tablet 0  . lisinopril-hydrochlorothiazide (PRINZIDE,ZESTORETIC)  20-25 MG tablet Take 1 tablet by mouth daily. 90 tablet 3  . Magnesium Citrate 100 MG TABS Take 1 tablet by mouth daily.    . Melatonin 10 MG CAPS Take 1 capsule by mouth daily.    . Multiple Vitamin (MULTIVITAMIN) tablet Take 1 tablet by mouth daily.    . ranitidine (ZANTAC) 150 MG tablet Take 150 mg by mouth 2 (two) times daily.    . Selenium (SELENIMIN PO) Take 1 tablet by mouth daily.    . temazepam (RESTORIL) 15 MG capsule Take 30 mg by mouth at bedtime.   1  . tiZANidine (ZANAFLEX) 4 MG tablet TAKE 1 TO 2 TABLETS(4 TO 8 MG) BY MOUTH EVERY 6 HOURS AS NEEDED (Patient taking differently: TAKE 1 TO 2 TABLETS(4 TO 8 MG) BY MOUTH EVERY 6 HOURS AS NEEDED for muscle spamss) 30 tablet 0  . Valerian 450 MG CAPS Take 1 capsule by mouth daily.    Marland Kitchen venlafaxine XR (EFFEXOR-XR) 75 MG 24 hr capsule Take 75 mg by mouth daily.    . vitamin E 400 UNIT capsule Take 400 Units by mouth daily.    Marland Kitchen lisinopril (PRINIVIL,ZESTRIL) 10 MG tablet Take 1 tablet (10 mg total) by mouth daily. 90 tablet 3  . loratadine (CLARITIN) 10 MG tablet Take 1 tablet (10 mg total) by mouth daily. 30 tablet 11   Facility-Administered Medications Prior to Visit  Medication Dose Route Frequency Provider Last Rate Last Dose  . 0.9 %  sodium chloride infusion  500 mL Intravenous Continuous Milus Banister, MD        Allergies  Allergen Reactions  . Levofloxacin Rash    REACTION: RASH/REDNESS    Review of Systems  Constitutional: Negative for chills, fever and malaise/fatigue.  HENT: Negative for congestion and hearing loss.   Eyes: Negative for discharge.  Respiratory: Positive for cough and sputum production. Negative for shortness of breath and wheezing.   Cardiovascular: Negative for chest pain, palpitations and leg swelling.  Gastrointestinal: Negative for abdominal pain, blood in stool, constipation, diarrhea, heartburn, nausea and vomiting.  Genitourinary: Negative for dysuria, frequency, hematuria and urgency.    Musculoskeletal: Negative for back pain, falls and myalgias.  Skin: Negative for rash.  Neurological: Negative for dizziness, sensory change, loss of consciousness, weakness and headaches.  Endo/Heme/Allergies: Negative for environmental allergies. Does not bruise/bleed easily.  Psychiatric/Behavioral: Negative for depression and suicidal ideas. The patient is not nervous/anxious and  does not have insomnia.        Objective:    Physical Exam  Constitutional: She is oriented to person, place, and time. She appears well-developed and well-nourished.  HENT:  Right Ear: Tympanic membrane, external ear and ear canal normal.  Left Ear: Tympanic membrane, external ear and ear canal normal.  Nose: Right sinus exhibits maxillary sinus tenderness and frontal sinus tenderness. Left sinus exhibits maxillary sinus tenderness and frontal sinus tenderness.  Mouth/Throat: Posterior oropharyngeal erythema present. No oropharyngeal exudate.  + PND + errythema  Eyes: Conjunctivae are normal. Right eye exhibits no discharge. Left eye exhibits no discharge.  Cardiovascular: Normal rate, regular rhythm and normal heart sounds.   No murmur heard. Pulmonary/Chest: Effort normal and breath sounds normal. No respiratory distress. She has no wheezes. She has no rales. She exhibits no tenderness.  Musculoskeletal: She exhibits no edema.  Lymphadenopathy:    She has cervical adenopathy.  Neurological: She is alert and oriented to person, place, and time.  Nursing note and vitals reviewed.   BP 110/72 (BP Location: Left Arm, Patient Position: Sitting, Cuff Size: Normal)   Pulse 79   Temp 98.1 F (36.7 C) (Oral)   Resp 18   Wt 130 lb (59 kg)   SpO2 98%   BMI 21.63 kg/m  Wt Readings from Last 3 Encounters:  06/06/16 130 lb (59 kg)  05/03/16 132 lb (59.9 kg)  04/01/16 132 lb 9.6 oz (60.1 kg)   BP Readings from Last 3 Encounters:  06/06/16 110/72  05/03/16 140/82  04/01/16 (!) 150/82      Immunization History  Administered Date(s) Administered  . Influenza Whole 11/09/2008, 11/29/2010  . Influenza-Unspecified 10/29/2015  . Pneumococcal Conjugate-13 08/08/2014  . Pneumococcal Polysaccharide-23 05/11/2012    Health Maintenance  Topic Date Due  . TETANUS/TDAP  05/18/1965  . INFLUENZA VACCINE  08/28/2016  . MAMMOGRAM  05/18/2018  . COLONOSCOPY  10/05/2020  . DEXA SCAN  Completed  . Hepatitis C Screening  Completed  . PNA vac Low Risk Adult  Completed    Lab Results  Component Value Date   WBC 7.1 05/03/2016   HGB 13.6 05/03/2016   HCT 39.3 05/03/2016   PLT 278 05/03/2016   GLUCOSE 99 05/03/2016   CHOL 232 (H) 05/03/2016   TRIG 75 05/03/2016   HDL 76 05/03/2016   LDLDIRECT 162.3 05/11/2012   LDLCALC 141 (H) 05/03/2016   ALT 11 05/03/2016   AST 16 05/03/2016   NA 132 (L) 05/03/2016   K 4.6 05/03/2016   CL 98 05/03/2016   CREATININE 0.67 05/03/2016   BUN 16 05/03/2016   CO2 26 05/03/2016   TSH 1.32 05/03/2016   INR 1.05 09/30/2009   HGBA1C 5.7 04/14/2007    Lab Results  Component Value Date   TSH 1.32 05/03/2016   Lab Results  Component Value Date   WBC 7.1 05/03/2016   HGB 13.6 05/03/2016   HCT 39.3 05/03/2016   MCV 93.8 05/03/2016   PLT 278 05/03/2016   Lab Results  Component Value Date   NA 132 (L) 05/03/2016   K 4.6 05/03/2016   CO2 26 05/03/2016   GLUCOSE 99 05/03/2016   BUN 16 05/03/2016   CREATININE 0.67 05/03/2016   BILITOT 0.4 05/03/2016   ALKPHOS 50 05/03/2016   AST 16 05/03/2016   ALT 11 05/03/2016   PROT 6.8 05/03/2016   ALBUMIN 4.5 05/03/2016   CALCIUM 9.6 05/03/2016   ANIONGAP 10 11/29/2015   GFR 75.53 08/24/2015  Lab Results  Component Value Date   CHOL 232 (H) 05/03/2016   Lab Results  Component Value Date   HDL 76 05/03/2016   Lab Results  Component Value Date   LDLCALC 141 (H) 05/03/2016   Lab Results  Component Value Date   TRIG 75 05/03/2016   Lab Results  Component Value Date   CHOLHDL 3.1  05/03/2016   Lab Results  Component Value Date   HGBA1C 5.7 04/14/2007         Assessment & Plan:   Problem List Items Addressed This Visit    None    Visit Diagnoses    Bronchitis    -  Primary   Relevant Medications   azithromycin (ZITHROMAX Z-PAK) 250 MG tablet   predniSONE (DELTASONE) 10 MG tablet   Cough       Relevant Medications   guaiFENesin (MUCINEX) 600 MG 12 hr tablet   Seasonal allergic rhinitis due to pollen       Relevant Medications   levocetirizine (XYZAL) 5 MG tablet   methylPREDNISolone acetate (DEPO-MEDROL) injection 80 mg (Completed)      I have discontinued Ms. Haugan's lisinopril and loratadine. I am also having her start on lisinopril, guaiFENesin, levocetirizine, azithromycin, and predniSONE. Additionally, I am having her maintain her multivitamin, cholecalciferol, cyanocobalamin, Biotin, B-Complex, Ascorbic Acid (VITAMIN C PO), temazepam, (Flaxseed, Linseed, (FLAX SEED OIL PO)), Valerian, Melatonin, Magnesium Citrate, Calcium Citrate (CITRACAL PO), tiZANidine, Selenium (SELENIMIN PO), DULoxetine, ranitidine, vitamin E, ibuprofen, venlafaxine XR, lamoTRIgine, lisinopril-hydrochlorothiazide, gabapentin, and ALPRAZolam. We administered methylPREDNISolone acetate. We will continue to administer sodium chloride.  Meds ordered this encounter  Medications  . lisinopril (PRINIVIL,ZESTRIL) 20 MG tablet    Sig: Take 1 tablet (20 mg total) by mouth daily.    Dispense:  90 tablet    Refill:  3  . guaiFENesin (MUCINEX) 600 MG 12 hr tablet    Sig: Take 2 tablets (1,200 mg total) by mouth 2 (two) times daily as needed.    Dispense:  60 tablet    Refill:  3  . levocetirizine (XYZAL) 5 MG tablet    Sig: Take 1 tablet (5 mg total) by mouth every evening.    Dispense:  30 tablet    Refill:  5  . azithromycin (ZITHROMAX Z-PAK) 250 MG tablet    Sig: As directed    Dispense:  6 each    Refill:  0  . predniSONE (DELTASONE) 10 MG tablet    Sig: TAKE 3 TABLETS PO  QD FOR 3 DAYS THEN TAKE 2 TABLETS PO QD FOR 3 DAYS THEN TAKE 1 TABLET PO QD FOR 3 DAYS THEN TAKE 1/2 TAB PO QD FOR 3 DAYS    Dispense:  20 tablet    Refill:  0  . methylPREDNISolone acetate (DEPO-MEDROL) injection 80 mg    CMA served as Education administrator during this visit. History, Physical and Plan performed by medical provider. Documentation and orders reviewed and attested to.  Ann Held, DO

## 2016-06-12 ENCOUNTER — Other Ambulatory Visit: Payer: Self-pay | Admitting: Family Medicine

## 2016-06-19 DIAGNOSIS — S82851A Displaced trimalleolar fracture of right lower leg, initial encounter for closed fracture: Secondary | ICD-10-CM | POA: Diagnosis not present

## 2016-06-19 DIAGNOSIS — M7989 Other specified soft tissue disorders: Secondary | ICD-10-CM | POA: Diagnosis not present

## 2016-06-19 DIAGNOSIS — G8918 Other acute postprocedural pain: Secondary | ICD-10-CM | POA: Diagnosis not present

## 2016-06-19 DIAGNOSIS — E860 Dehydration: Secondary | ICD-10-CM | POA: Diagnosis not present

## 2016-06-19 DIAGNOSIS — S82852A Displaced trimalleolar fracture of left lower leg, initial encounter for closed fracture: Secondary | ICD-10-CM | POA: Diagnosis not present

## 2016-06-19 DIAGNOSIS — E871 Hypo-osmolality and hyponatremia: Secondary | ICD-10-CM | POA: Diagnosis not present

## 2016-06-19 DIAGNOSIS — I10 Essential (primary) hypertension: Secondary | ICD-10-CM | POA: Diagnosis not present

## 2016-06-20 DIAGNOSIS — E860 Dehydration: Secondary | ICD-10-CM | POA: Diagnosis not present

## 2016-06-20 DIAGNOSIS — I10 Essential (primary) hypertension: Secondary | ICD-10-CM | POA: Diagnosis not present

## 2016-06-20 DIAGNOSIS — S82851A Displaced trimalleolar fracture of right lower leg, initial encounter for closed fracture: Secondary | ICD-10-CM | POA: Diagnosis not present

## 2016-06-20 DIAGNOSIS — E871 Hypo-osmolality and hyponatremia: Secondary | ICD-10-CM | POA: Diagnosis not present

## 2016-06-20 HISTORY — PX: ANKLE ARTHROSCOPY WITH OPEN REDUCTION INTERNAL FIXATION (ORIF): SHX5582

## 2016-06-22 DIAGNOSIS — F419 Anxiety disorder, unspecified: Secondary | ICD-10-CM | POA: Diagnosis not present

## 2016-06-22 DIAGNOSIS — G629 Polyneuropathy, unspecified: Secondary | ICD-10-CM | POA: Diagnosis not present

## 2016-06-22 DIAGNOSIS — I10 Essential (primary) hypertension: Secondary | ICD-10-CM | POA: Diagnosis not present

## 2016-06-22 DIAGNOSIS — W19XXXD Unspecified fall, subsequent encounter: Secondary | ICD-10-CM | POA: Diagnosis not present

## 2016-06-22 DIAGNOSIS — S82851D Displaced trimalleolar fracture of right lower leg, subsequent encounter for closed fracture with routine healing: Secondary | ICD-10-CM | POA: Diagnosis not present

## 2016-06-23 DIAGNOSIS — G629 Polyneuropathy, unspecified: Secondary | ICD-10-CM | POA: Diagnosis not present

## 2016-06-23 DIAGNOSIS — S82851D Displaced trimalleolar fracture of right lower leg, subsequent encounter for closed fracture with routine healing: Secondary | ICD-10-CM | POA: Diagnosis not present

## 2016-06-23 DIAGNOSIS — W19XXXD Unspecified fall, subsequent encounter: Secondary | ICD-10-CM | POA: Diagnosis not present

## 2016-06-23 DIAGNOSIS — I10 Essential (primary) hypertension: Secondary | ICD-10-CM | POA: Diagnosis not present

## 2016-06-23 DIAGNOSIS — F419 Anxiety disorder, unspecified: Secondary | ICD-10-CM | POA: Diagnosis not present

## 2016-06-25 DIAGNOSIS — S82851D Displaced trimalleolar fracture of right lower leg, subsequent encounter for closed fracture with routine healing: Secondary | ICD-10-CM | POA: Diagnosis not present

## 2016-06-25 DIAGNOSIS — F419 Anxiety disorder, unspecified: Secondary | ICD-10-CM | POA: Diagnosis not present

## 2016-06-25 DIAGNOSIS — G629 Polyneuropathy, unspecified: Secondary | ICD-10-CM | POA: Diagnosis not present

## 2016-06-25 DIAGNOSIS — I10 Essential (primary) hypertension: Secondary | ICD-10-CM | POA: Diagnosis not present

## 2016-06-25 DIAGNOSIS — W19XXXD Unspecified fall, subsequent encounter: Secondary | ICD-10-CM | POA: Diagnosis not present

## 2016-06-26 DIAGNOSIS — W19XXXD Unspecified fall, subsequent encounter: Secondary | ICD-10-CM | POA: Diagnosis not present

## 2016-06-26 DIAGNOSIS — I10 Essential (primary) hypertension: Secondary | ICD-10-CM | POA: Diagnosis not present

## 2016-06-26 DIAGNOSIS — G629 Polyneuropathy, unspecified: Secondary | ICD-10-CM | POA: Diagnosis not present

## 2016-06-26 DIAGNOSIS — S82851D Displaced trimalleolar fracture of right lower leg, subsequent encounter for closed fracture with routine healing: Secondary | ICD-10-CM | POA: Diagnosis not present

## 2016-06-26 DIAGNOSIS — F419 Anxiety disorder, unspecified: Secondary | ICD-10-CM | POA: Diagnosis not present

## 2016-06-27 DIAGNOSIS — M25571 Pain in right ankle and joints of right foot: Secondary | ICD-10-CM | POA: Diagnosis not present

## 2016-06-28 DIAGNOSIS — I10 Essential (primary) hypertension: Secondary | ICD-10-CM | POA: Diagnosis not present

## 2016-06-28 DIAGNOSIS — G8918 Other acute postprocedural pain: Secondary | ICD-10-CM | POA: Diagnosis not present

## 2016-06-28 DIAGNOSIS — S82851A Displaced trimalleolar fracture of right lower leg, initial encounter for closed fracture: Secondary | ICD-10-CM | POA: Diagnosis not present

## 2016-06-28 DIAGNOSIS — Z4789 Encounter for other orthopedic aftercare: Secondary | ICD-10-CM | POA: Diagnosis not present

## 2016-07-01 ENCOUNTER — Telehealth: Payer: Self-pay | Admitting: Family Medicine

## 2016-07-01 NOTE — Telephone Encounter (Signed)
She prefers not to take lisinopril with fluid pill. Patient has had 2 surgeries--Ankle surgery due to accident  (surgery was on Friday) She is taking hydrocodone every 4 hours and ibuprofen every 4 hours She got really sick and wanted to know if PCP felt due to surgery or medication. Patient is hoarse and having symptoms of allergies (she thinks may be due to lisinopril)? Some nausea ---has not had a drink since 06/17/2016. So she wants to know if the lisinopril along with all the surgeries going on could be causing all these symptoms.

## 2016-07-01 NOTE — Telephone Encounter (Signed)
Lisinopril can cause cough/ hoarseness but not the other symptoms Those most likely from pain med and/or anesthesia

## 2016-07-01 NOTE — Telephone Encounter (Signed)
Caller name: Relationship to patient: Self Can be reached: [phone] Pharmacy:  Reason for call: Patient request a call back from provider to discuss surgery and medication. Patient is in [place], [place] and not comfortable with doctors that are caring for her.

## 2016-07-02 DIAGNOSIS — F419 Anxiety disorder, unspecified: Secondary | ICD-10-CM | POA: Diagnosis not present

## 2016-07-02 DIAGNOSIS — I10 Essential (primary) hypertension: Secondary | ICD-10-CM | POA: Diagnosis not present

## 2016-07-02 DIAGNOSIS — S82851D Displaced trimalleolar fracture of right lower leg, subsequent encounter for closed fracture with routine healing: Secondary | ICD-10-CM | POA: Diagnosis not present

## 2016-07-02 DIAGNOSIS — G629 Polyneuropathy, unspecified: Secondary | ICD-10-CM | POA: Diagnosis not present

## 2016-07-02 DIAGNOSIS — W19XXXD Unspecified fall, subsequent encounter: Secondary | ICD-10-CM | POA: Diagnosis not present

## 2016-07-02 NOTE — Telephone Encounter (Signed)
Patient informed of PCP response °

## 2016-07-04 DIAGNOSIS — I10 Essential (primary) hypertension: Secondary | ICD-10-CM | POA: Diagnosis not present

## 2016-07-04 DIAGNOSIS — F419 Anxiety disorder, unspecified: Secondary | ICD-10-CM | POA: Diagnosis not present

## 2016-07-04 DIAGNOSIS — G629 Polyneuropathy, unspecified: Secondary | ICD-10-CM | POA: Diagnosis not present

## 2016-07-04 DIAGNOSIS — S82851D Displaced trimalleolar fracture of right lower leg, subsequent encounter for closed fracture with routine healing: Secondary | ICD-10-CM | POA: Diagnosis not present

## 2016-07-04 DIAGNOSIS — W19XXXD Unspecified fall, subsequent encounter: Secondary | ICD-10-CM | POA: Diagnosis not present

## 2016-07-08 ENCOUNTER — Other Ambulatory Visit: Payer: Self-pay | Admitting: Family Medicine

## 2016-07-08 NOTE — Telephone Encounter (Signed)
Last Gabapentin refill 05/21/16 #90 Last office visit 06/06/16

## 2016-07-09 DIAGNOSIS — W19XXXD Unspecified fall, subsequent encounter: Secondary | ICD-10-CM | POA: Diagnosis not present

## 2016-07-09 DIAGNOSIS — G629 Polyneuropathy, unspecified: Secondary | ICD-10-CM | POA: Diagnosis not present

## 2016-07-09 DIAGNOSIS — I10 Essential (primary) hypertension: Secondary | ICD-10-CM | POA: Diagnosis not present

## 2016-07-09 DIAGNOSIS — F419 Anxiety disorder, unspecified: Secondary | ICD-10-CM | POA: Diagnosis not present

## 2016-07-09 DIAGNOSIS — S82851D Displaced trimalleolar fracture of right lower leg, subsequent encounter for closed fracture with routine healing: Secondary | ICD-10-CM | POA: Diagnosis not present

## 2016-07-11 ENCOUNTER — Other Ambulatory Visit: Payer: Self-pay | Admitting: Family Medicine

## 2016-07-11 DIAGNOSIS — S82141A Displaced bicondylar fracture of right tibia, initial encounter for closed fracture: Secondary | ICD-10-CM | POA: Diagnosis not present

## 2016-07-15 DIAGNOSIS — G629 Polyneuropathy, unspecified: Secondary | ICD-10-CM | POA: Diagnosis not present

## 2016-07-15 DIAGNOSIS — I10 Essential (primary) hypertension: Secondary | ICD-10-CM | POA: Diagnosis not present

## 2016-07-15 DIAGNOSIS — S82851D Displaced trimalleolar fracture of right lower leg, subsequent encounter for closed fracture with routine healing: Secondary | ICD-10-CM | POA: Diagnosis not present

## 2016-07-15 DIAGNOSIS — W19XXXD Unspecified fall, subsequent encounter: Secondary | ICD-10-CM | POA: Diagnosis not present

## 2016-07-15 DIAGNOSIS — F419 Anxiety disorder, unspecified: Secondary | ICD-10-CM | POA: Diagnosis not present

## 2016-07-27 DIAGNOSIS — G629 Polyneuropathy, unspecified: Secondary | ICD-10-CM | POA: Diagnosis not present

## 2016-07-27 DIAGNOSIS — I10 Essential (primary) hypertension: Secondary | ICD-10-CM | POA: Diagnosis not present

## 2016-07-27 DIAGNOSIS — F419 Anxiety disorder, unspecified: Secondary | ICD-10-CM | POA: Diagnosis not present

## 2016-07-27 DIAGNOSIS — S82851D Displaced trimalleolar fracture of right lower leg, subsequent encounter for closed fracture with routine healing: Secondary | ICD-10-CM | POA: Diagnosis not present

## 2016-07-27 DIAGNOSIS — W19XXXD Unspecified fall, subsequent encounter: Secondary | ICD-10-CM | POA: Diagnosis not present

## 2016-08-01 DIAGNOSIS — M25571 Pain in right ankle and joints of right foot: Secondary | ICD-10-CM | POA: Diagnosis not present

## 2016-08-01 DIAGNOSIS — M65312 Trigger thumb, left thumb: Secondary | ICD-10-CM | POA: Diagnosis not present

## 2016-08-02 DIAGNOSIS — W19XXXD Unspecified fall, subsequent encounter: Secondary | ICD-10-CM | POA: Diagnosis not present

## 2016-08-02 DIAGNOSIS — I10 Essential (primary) hypertension: Secondary | ICD-10-CM | POA: Diagnosis not present

## 2016-08-02 DIAGNOSIS — S82851D Displaced trimalleolar fracture of right lower leg, subsequent encounter for closed fracture with routine healing: Secondary | ICD-10-CM | POA: Diagnosis not present

## 2016-08-02 DIAGNOSIS — G629 Polyneuropathy, unspecified: Secondary | ICD-10-CM | POA: Diagnosis not present

## 2016-08-02 DIAGNOSIS — F419 Anxiety disorder, unspecified: Secondary | ICD-10-CM | POA: Diagnosis not present

## 2016-08-06 ENCOUNTER — Ambulatory Visit: Payer: Medicare Other | Admitting: Family Medicine

## 2016-08-06 DIAGNOSIS — I10 Essential (primary) hypertension: Secondary | ICD-10-CM | POA: Diagnosis not present

## 2016-08-06 DIAGNOSIS — W19XXXD Unspecified fall, subsequent encounter: Secondary | ICD-10-CM | POA: Diagnosis not present

## 2016-08-06 DIAGNOSIS — G629 Polyneuropathy, unspecified: Secondary | ICD-10-CM | POA: Diagnosis not present

## 2016-08-06 DIAGNOSIS — F419 Anxiety disorder, unspecified: Secondary | ICD-10-CM | POA: Diagnosis not present

## 2016-08-06 DIAGNOSIS — S82851D Displaced trimalleolar fracture of right lower leg, subsequent encounter for closed fracture with routine healing: Secondary | ICD-10-CM | POA: Diagnosis not present

## 2016-08-09 ENCOUNTER — Other Ambulatory Visit: Payer: Self-pay | Admitting: Family Medicine

## 2016-08-13 DIAGNOSIS — M25571 Pain in right ankle and joints of right foot: Secondary | ICD-10-CM | POA: Diagnosis not present

## 2016-08-13 DIAGNOSIS — M84471D Pathological fracture, right ankle, subsequent encounter for fracture with routine healing: Secondary | ICD-10-CM | POA: Diagnosis not present

## 2016-08-16 DIAGNOSIS — M25571 Pain in right ankle and joints of right foot: Secondary | ICD-10-CM | POA: Diagnosis not present

## 2016-08-16 DIAGNOSIS — M84471D Pathological fracture, right ankle, subsequent encounter for fracture with routine healing: Secondary | ICD-10-CM | POA: Diagnosis not present

## 2016-08-23 DIAGNOSIS — M84471D Pathological fracture, right ankle, subsequent encounter for fracture with routine healing: Secondary | ICD-10-CM | POA: Diagnosis not present

## 2016-08-23 DIAGNOSIS — M25571 Pain in right ankle and joints of right foot: Secondary | ICD-10-CM | POA: Diagnosis not present

## 2016-08-30 DIAGNOSIS — M84471D Pathological fracture, right ankle, subsequent encounter for fracture with routine healing: Secondary | ICD-10-CM | POA: Diagnosis not present

## 2016-08-30 DIAGNOSIS — M25571 Pain in right ankle and joints of right foot: Secondary | ICD-10-CM | POA: Diagnosis not present

## 2016-09-02 DIAGNOSIS — M25571 Pain in right ankle and joints of right foot: Secondary | ICD-10-CM | POA: Diagnosis not present

## 2016-09-02 DIAGNOSIS — M84471D Pathological fracture, right ankle, subsequent encounter for fracture with routine healing: Secondary | ICD-10-CM | POA: Diagnosis not present

## 2016-09-03 ENCOUNTER — Telehealth: Payer: Self-pay | Admitting: Family Medicine

## 2016-09-03 NOTE — Telephone Encounter (Signed)
Relation to pt: self  Call back number: [phone]  Pharmacy:  Reason for call:  Patient states lisinopril (PRINIVIL,ZESTRIL) 20 MG tablet is causing her to cough during the day and at night, requesting clinical advice, patient is out of town, please advise

## 2016-09-04 DIAGNOSIS — M25571 Pain in right ankle and joints of right foot: Secondary | ICD-10-CM | POA: Diagnosis not present

## 2016-09-04 DIAGNOSIS — M84471D Pathological fracture, right ankle, subsequent encounter for fracture with routine healing: Secondary | ICD-10-CM | POA: Diagnosis not present

## 2016-09-04 NOTE — Telephone Encounter (Signed)
Stop Prinzide. Call in Hyzaar 100-25 mg daily and have her follow up with Dr. Etter Sjogren in 2 weeks or when pt returns. TY.

## 2016-09-04 NOTE — Telephone Encounter (Signed)
Patient states lisinopril (PRINIVIL,ZESTRIL) 20 MG tablet is causing her to cough during the day and at night, requesting clinical advice, patient is out of town, please advise

## 2016-09-05 ENCOUNTER — Other Ambulatory Visit: Payer: Self-pay | Admitting: Family Medicine

## 2016-09-05 NOTE — Telephone Encounter (Signed)
Pt is needing the hyzaar to be called in to [facility]

## 2016-09-05 NOTE — Telephone Encounter (Signed)
Left message on pt' vm to give the office a call back, in reference to current medication Lisinopril 20mg , patient report medication is given her a cough. Covering Provider recommend patient switching to Hyzaar 100-25 mg daily and follow up with PCP within 2 weeks. LB

## 2016-09-05 NOTE — Telephone Encounter (Signed)
Spoke to pt. Covering provider Dr. Nani Ravens recommend pt to change Lisinopril 20 mg to Hyzaar 100-25 mg daily. Pt will call back with pharmacy information to send new medication. Pt state she just moved and she does not have the pharmacy's information. Please send Hyzaar 100-25 mg po daily to new pharmacy and enter appointment for pt to follow up with Dr. Etter Sjogren in 2 weeks. LB

## 2016-09-05 NOTE — Telephone Encounter (Signed)
Last office visit on 06/06/2016 Last refill #90 no refills on 07/11/2016

## 2016-09-05 NOTE — Telephone Encounter (Signed)
Pt returned call and asked if possible for Nurse to return call at her cell phone: [phone]

## 2016-09-06 ENCOUNTER — Other Ambulatory Visit: Payer: Self-pay

## 2016-09-06 MED ORDER — LOSARTAN POTASSIUM-HCTZ 100-25 MG PO TABS: 1.0000 | ORAL_TABLET | Freq: Every day | ORAL | 0 refills | Status: DC

## 2016-09-06 NOTE — Telephone Encounter (Signed)
Sent in as requested 

## 2016-09-11 DIAGNOSIS — M25571 Pain in right ankle and joints of right foot: Secondary | ICD-10-CM | POA: Diagnosis not present

## 2016-09-11 DIAGNOSIS — M84471D Pathological fracture, right ankle, subsequent encounter for fracture with routine healing: Secondary | ICD-10-CM | POA: Diagnosis not present

## 2016-09-13 DIAGNOSIS — M84471A Pathological fracture, right ankle, initial encounter for fracture: Secondary | ICD-10-CM | POA: Diagnosis not present

## 2016-09-17 DIAGNOSIS — M84471A Pathological fracture, right ankle, initial encounter for fracture: Secondary | ICD-10-CM | POA: Diagnosis not present

## 2016-09-18 DIAGNOSIS — M84471A Pathological fracture, right ankle, initial encounter for fracture: Secondary | ICD-10-CM | POA: Diagnosis not present

## 2016-09-19 DIAGNOSIS — M84471A Pathological fracture, right ankle, initial encounter for fracture: Secondary | ICD-10-CM | POA: Diagnosis not present

## 2016-09-23 DIAGNOSIS — S82851A Displaced trimalleolar fracture of right lower leg, initial encounter for closed fracture: Secondary | ICD-10-CM | POA: Diagnosis not present

## 2016-09-24 DIAGNOSIS — M84471A Pathological fracture, right ankle, initial encounter for fracture: Secondary | ICD-10-CM | POA: Diagnosis not present

## 2016-09-25 DIAGNOSIS — M84471A Pathological fracture, right ankle, initial encounter for fracture: Secondary | ICD-10-CM | POA: Diagnosis not present

## 2016-09-26 DIAGNOSIS — M84471A Pathological fracture, right ankle, initial encounter for fracture: Secondary | ICD-10-CM | POA: Diagnosis not present

## 2016-10-01 DIAGNOSIS — M84471A Pathological fracture, right ankle, initial encounter for fracture: Secondary | ICD-10-CM | POA: Diagnosis not present

## 2016-10-02 ENCOUNTER — Other Ambulatory Visit: Payer: Self-pay | Admitting: Family Medicine

## 2016-10-02 DIAGNOSIS — M84471A Pathological fracture, right ankle, initial encounter for fracture: Secondary | ICD-10-CM | POA: Diagnosis not present

## 2016-10-03 DIAGNOSIS — M84471A Pathological fracture, right ankle, initial encounter for fracture: Secondary | ICD-10-CM | POA: Diagnosis not present

## 2016-10-04 DIAGNOSIS — Z23 Encounter for immunization: Secondary | ICD-10-CM | POA: Diagnosis not present

## 2016-10-08 DIAGNOSIS — M84471A Pathological fracture, right ankle, initial encounter for fracture: Secondary | ICD-10-CM | POA: Diagnosis not present

## 2016-10-09 DIAGNOSIS — M84471A Pathological fracture, right ankle, initial encounter for fracture: Secondary | ICD-10-CM | POA: Diagnosis not present

## 2016-10-10 DIAGNOSIS — M84471A Pathological fracture, right ankle, initial encounter for fracture: Secondary | ICD-10-CM | POA: Diagnosis not present

## 2016-10-21 ENCOUNTER — Ambulatory Visit: Payer: Medicare Other | Admitting: Family Medicine

## 2016-10-21 DIAGNOSIS — Z0289 Encounter for other administrative examinations: Secondary | ICD-10-CM

## 2016-10-22 DIAGNOSIS — F331 Major depressive disorder, recurrent, moderate: Secondary | ICD-10-CM | POA: Diagnosis not present

## 2016-11-06 ENCOUNTER — Other Ambulatory Visit: Payer: Medicare Other

## 2016-11-11 ENCOUNTER — Other Ambulatory Visit: Payer: Self-pay | Admitting: Family Medicine

## 2016-11-13 NOTE — Telephone Encounter (Signed)
Pt C/A on 7.10.18; N/S on 7.10.18; N/S on 9.24.18; C/A on 10.10.18/no refill till OV/thx dmf

## 2016-11-26 ENCOUNTER — Ambulatory Visit (INDEPENDENT_AMBULATORY_CARE_PROVIDER_SITE_OTHER): Payer: Medicare Other | Admitting: Family Medicine

## 2016-11-26 ENCOUNTER — Other Ambulatory Visit: Payer: Self-pay

## 2016-11-26 ENCOUNTER — Encounter: Payer: Self-pay | Admitting: Family Medicine

## 2016-11-26 VITALS — BP 150/90 | HR 88 | Ht 66.0 in | Wt 151.0 lb

## 2016-11-26 DIAGNOSIS — F322 Major depressive disorder, single episode, severe without psychotic features: Secondary | ICD-10-CM

## 2016-11-26 DIAGNOSIS — I10 Essential (primary) hypertension: Secondary | ICD-10-CM

## 2016-11-26 DIAGNOSIS — F319 Bipolar disorder, unspecified: Secondary | ICD-10-CM

## 2016-11-26 DIAGNOSIS — E782 Mixed hyperlipidemia: Secondary | ICD-10-CM

## 2016-11-26 DIAGNOSIS — J301 Allergic rhinitis due to pollen: Secondary | ICD-10-CM

## 2016-11-26 DIAGNOSIS — S82891D Other fracture of right lower leg, subsequent encounter for closed fracture with routine healing: Secondary | ICD-10-CM

## 2016-11-26 MED ORDER — ALPRAZOLAM 1 MG PO TABS: ORAL_TABLET | ORAL | 0 refills | Status: AC

## 2016-11-26 MED ORDER — GABAPENTIN 600 MG PO TABS: 600.0000 mg | ORAL_TABLET | Freq: Three times a day (TID) | ORAL | 0 refills | Status: DC

## 2016-11-26 MED ORDER — LAMOTRIGINE 100 MG PO TABS: 100.0000 mg | ORAL_TABLET | Freq: Two times a day (BID) | ORAL | 5 refills | Status: DC

## 2016-11-26 MED ORDER — LAMOTRIGINE 100 MG PO TABS: 100.0000 mg | ORAL_TABLET | Freq: Two times a day (BID) | ORAL | 0 refills | Status: DC

## 2016-11-26 MED ORDER — GABAPENTIN 600 MG PO TABS: 600.0000 mg | ORAL_TABLET | Freq: Three times a day (TID) | ORAL | 5 refills | Status: DC

## 2016-11-26 MED ORDER — LEVOCETIRIZINE DIHYDROCHLORIDE 5 MG PO TABS: 5.0000 mg | ORAL_TABLET | Freq: Every evening | ORAL | 5 refills | Status: DC

## 2016-11-26 NOTE — Assessment & Plan Note (Signed)
Pt feels she is not bipolar Will refill for now and she has appointment with counselor and psych She does not feel like she is going to hurt herself or anyone else

## 2016-11-26 NOTE — Assessment & Plan Note (Signed)
Refer to PT

## 2016-11-26 NOTE — Assessment & Plan Note (Signed)
Encouraged heart healthy diet, increase exercise, avoid trans fats, consider a krill oil cap daily 

## 2016-11-26 NOTE — Progress Notes (Signed)
Patient ID: Toni [name], female    DOB: [birth year]  Age: 71 y.o. MRN: [id number]    Subjective:  Subjective  HPI Toni [name] presents for f/u fracture r ankle 06/19/2016----she went to the hospital the next day and had 2 surgerys while she was in [place] She needs to con't PT but is healing well.   Review of Systems  Constitutional: Negative for appetite change, diaphoresis, fatigue and unexpected weight change.  Eyes: Negative for pain, redness and visual disturbance.  Respiratory: Negative for cough, chest tightness, shortness of breath and wheezing.   Cardiovascular: Negative for chest pain, palpitations and leg swelling.  Endocrine: Negative for cold intolerance, heat intolerance, polydipsia, polyphagia and polyuria.  Genitourinary: Negative for difficulty urinating, dysuria and frequency.  Musculoskeletal: Positive for arthralgias.  Neurological: Negative for dizziness, light-headedness, numbness and headaches.  Psychiatric/Behavioral: The patient is nervous/anxious.     History Past Medical History:  Diagnosis Date  . Bipolar disorder ([place])    pt denies  . COPD (chronic obstructive pulmonary disease) ([place])   . Depression   . Diverticulitis   . Diverticulitis   . Fibromyalgia   . Gallstones 11/09/2015  . GERD (gastroesophageal reflux disease)   . Headache   . Hyperlipidemia   . Hypertension   . Osteoarthritis     She has a past surgical history that includes Tonsillectomy and adenoidectomy; Total hip arthroplasty (Left); Femur fracture surgery (09/2009); Joint replacement (Right, 09/2009); Colon resection (2001); Colostomy closure (2011); Cholecystectomy (N/A, 12/04/2015); and Ankle arthroscopy with open reduction internal fixation (orif) (Right, 06/20/2016).   Her family history includes Achalasia in her father; Alcohol abuse in her unknown relative; Arthritis in her unknown relative; Cancer in her unknown relative; Depression in her unknown relative; Stroke in her  father and mother.She reports that she has been smoking.  She has never used smokeless tobacco. She reports that she drinks alcohol. She reports that she uses drugs, including Marijuana, about 14 times per week.  Current Outpatient Prescriptions on File Prior to Visit  Medication Sig Dispense Refill  . Ascorbic Acid (VITAMIN C PO) Take by mouth.    . B-COMPLEX CAPS Take 1 capsule by mouth daily.    . Biotin 1000 MCG tablet Take 5,000 mcg by mouth daily.     . Calcium Citrate (CITRACAL PO) Take 1 tablet by mouth daily.    . cholecalciferol (VITAMIN D) 1000 UNITS tablet Take 1,000 Units by mouth daily.     . cyanocobalamin 1000 MCG tablet Take 2,000 mcg by mouth daily.     . Flaxseed, Linseed, (FLAX SEED OIL PO) Take 1 capsule by mouth daily.    Marland Kitchen guaiFENesin (MUCINEX) 600 MG 12 hr tablet Take 2 tablets (1,200 mg total) by mouth 2 (two) times daily as needed. 60 tablet 3  . ibuprofen (ADVIL,MOTRIN) 200 MG tablet Take 1 tablet (200 mg total) by mouth every 6 (six) hours as needed for moderate pain. 30 tablet 0  . losartan-hydrochlorothiazide (HYZAAR) 100-25 MG tablet TAKE 1 TABLET BY MOUTH DAILY 90 tablet 0  . Magnesium Citrate 100 MG TABS Take 1 tablet by mouth daily.    . Melatonin 10 MG CAPS Take 1 capsule by mouth daily.    . Multiple Vitamin (MULTIVITAMIN) tablet Take 1 tablet by mouth daily.    . Selenium (SELENIMIN PO) Take 1 tablet by mouth daily.    . temazepam (RESTORIL) 15 MG capsule Take 30 mg by mouth at bedtime.   1  . Valerian 450 MG CAPS  Take 1 capsule by mouth daily.    Marland Kitchen venlafaxine XR (EFFEXOR-XR) 75 MG 24 hr capsule Take 75 mg by mouth daily.    . vitamin E 400 UNIT capsule Take 400 Units by mouth daily.     Current Facility-Administered Medications on File Prior to Visit  Medication Dose Route Frequency Provider Last Rate Last Dose  . 0.9 %  sodium chloride infusion  500 mL Intravenous Continuous Milus Banister, MD         Objective:  Objective  Physical Exam    Constitutional: She is oriented to person, place, and time. She appears well-developed and well-nourished.  HENT:  Head: Normocephalic and atraumatic.  Eyes: Conjunctivae and EOM are normal.  Neck: Normal range of motion. Neck supple. No JVD present. Carotid bruit is not present. No thyromegaly present.  Cardiovascular: Normal rate, regular rhythm and normal heart sounds.   No murmur heard. Pulmonary/Chest: Effort normal and breath sounds normal. No respiratory distress. She has no wheezes. She has no rales. She exhibits no tenderness.  Musculoskeletal: She exhibits tenderness. She exhibits no edema.  r ankle in brace  Neurological: She is alert and oriented to person, place, and time.  Psychiatric: She has a normal mood and affect.  Nursing note and vitals reviewed.  BP (!) 150/90   Pulse 88   Ht 5\' 6"  (1.676 m)   Wt 151 lb (68.5 kg)   BMI 24.37 kg/m  Wt Readings from Last 3 Encounters:  11/26/16 151 lb (68.5 kg)  06/06/16 130 lb (59 kg)  05/03/16 132 lb (59.9 kg)     Lab Results  Component Value Date   WBC 7.1 05/03/2016   HGB 13.6 05/03/2016   HCT 39.3 05/03/2016   PLT 278 05/03/2016   GLUCOSE 99 05/03/2016   CHOL 232 (H) 05/03/2016   TRIG 75 05/03/2016   HDL 76 05/03/2016   LDLDIRECT 162.3 05/11/2012   LDLCALC 141 (H) 05/03/2016   ALT 11 05/03/2016   AST 16 05/03/2016   NA 132 (L) 05/03/2016   K 4.6 05/03/2016   CL 98 05/03/2016   CREATININE 0.67 05/03/2016   BUN 16 05/03/2016   CO2 26 05/03/2016   TSH 1.32 05/03/2016   INR 1.05 09/30/2009   HGBA1C 5.7 04/14/2007    Dg Bone Density  Result Date: 05/17/2016 EXAM: DUAL X-RAY ABSORPTIOMETRY (DXA) FOR BONE MINERAL DENSITY IMPRESSION: Referring Physician:  Rosalita Chessman PATIENT: Name: Toni [name] Patient ID: [id number] Birth Date: 06/30/1945 Height: 64.5 in. Sex: Female Measured: 05/17/2016 Weight: 130.5 lbs. Indications: Bilateral hip replacement, Caucasian, cymbalta, Depression, Estrogen Deficient,  Gabapentin, Postmenopausal Fractures: Femur Treatments: Calcitonin, Vitamin D (E933.5) ASSESSMENT: The BMD measured at Forearm Radius 33% is 0.804 g/cm2 with a T-score of -0.9. This patient is considered normal according to [facility]) criteria. Both hips were not utilized due to surgical hardware. Lumbar spine was not utilized due to advanced degenerative changes. Site Region Measured Date Measured Age YA BMD Significant CHANGE T-score Left Forearm Radius 33% 05/17/2016 69.9 -1.9 0.804 g/cm2 World Health Organization [facility]) criteria for post-menopausal, Caucasian Women: Normal       T-score at or above -1 SD Osteopenia   T-score between -1 and -2.5 SD Osteoporosis T-score at or below -2.5 SD RECOMMENDATION: [facility] recommends that FDA-approved medical therapies be considered in postmenopausal women and men age 65 or older with a: 1. Hip or vertebral (clinical or morphometric) fracture. 2. T-score of <-2.5 at the spine or hip. 3.  Ten-year fracture probability by FRAX of 3% or greater for hip fracture or 20% or greater for major osteoporotic fracture. All treatment decisions require clinical judgment and consideration of individual patient factors, including patient preferences, co-morbidities, previous drug use, risk factors not captured in the FRAX model (e.g. falls, vitamin D deficiency, increased bone turnover, interval significant decline in bone density) and possible under - or over-estimation of fracture risk by FRAX. All patients should ensure an adequate intake of dietary calcium (1200 mg/d) and vitamin D (800 IU daily) unless contraindicated. FOLLOW-UP: People with diagnosed cases of osteoporosis or at high risk for fracture should have regular bone mineral density tests. For patients eligible for Medicare, routine testing is allowed once every 2 years. The testing frequency can be increased to one year for patients who have rapidly progressing disease, those who  are receiving or discontinuing medical therapy to restore bone mass, or have additional risk factors. I have reviewed this report, and agree with the above findings. [facility] Electronically Signed   By: Earle Gell M.D.   On: 05/17/2016 13:01   Mm Screening Breast Tomo Bilateral  Result Date: 05/17/2016 CLINICAL DATA:  Screening. EXAM: 2D DIGITAL SCREENING BILATERAL MAMMOGRAM WITH CAD AND ADJUNCT TOMO COMPARISON:  Previous exam(s). ACR Breast Density Category b: There are scattered areas of fibroglandular density. FINDINGS: There are no findings suspicious for malignancy. Images were processed with CAD. IMPRESSION: No mammographic evidence of malignancy. A result letter of this screening mammogram will be mailed directly to the patient. RECOMMENDATION: Screening mammogram in one year. (Code:SM-B-01Y) BI-RADS CATEGORY  1: Negative. Electronically Signed   By: Fidela Salisbury M.D.   On: 05/17/2016 14:30     Assessment & Plan:  Plan  I have discontinued Ms. Formica's tiZANidine, DULoxetine, ranitidine, lisinopril-hydrochlorothiazide, lisinopril, azithromycin, predniSONE, and gabapentin. I have also changed her lamoTRIgine and gabapentin. Additionally, I am having her maintain her multivitamin, cholecalciferol, cyanocobalamin, Biotin, B-Complex, Ascorbic Acid (VITAMIN C PO), temazepam, (Flaxseed, Linseed, (FLAX SEED OIL PO)), Valerian, Melatonin, Magnesium Citrate, Calcium Citrate (CITRACAL PO), Selenium (SELENIMIN PO), vitamin E, ibuprofen, venlafaxine XR, guaiFENesin, and losartan-hydrochlorothiazide. We will continue to administer sodium chloride.  Meds ordered this encounter  Medications  . lamoTRIgine (LAMICTAL) 100 MG tablet    Sig: Take 1 tablet (100 mg total) by mouth 2 (two) times daily.    Dispense:  60 tablet    Refill:  5  . gabapentin (NEURONTIN) 600 MG tablet    Sig: Take 1 tablet (600 mg total) by mouth 3 (three) times daily.    Dispense:  90 tablet    Refill:  5     Problem List Items Addressed This Visit      Unprioritized   Bipolar 1 disorder ([place])    Pt feels she is not bipolar Will refill for now and she has appointment with counselor and psych She does not feel like she is going to hurt herself or anyone else      Closed right ankle fracture, with routine healing, subsequent encounter    Refer to PT       Relevant Medications   gabapentin (NEURONTIN) 600 MG tablet   Other Relevant Orders   Ambulatory referral to Physical Therapy   Essential hypertension     Poorly controlled , encouraged DASH diet, minimize caffeine and obtain adequate sleep. Report concerning symptoms and follow up as directed and as needed      Relevant Orders   Lipid panel   Comprehensive metabolic panel  Hyperlipidemia    Encouraged heart healthy diet, increase exercise, avoid trans fats, consider a krill oil cap daily      Relevant Orders   Lipid panel   Comprehensive metabolic panel    Other Visit Diagnoses    Depression, major, single episode, severe ([place])    -  Primary   Relevant Medications   lamoTRIgine (LAMICTAL) 100 MG tablet      Follow-up: Return in about 6 months (around 05/27/2017), or if symptoms worsen or fail to improve, for hyperlipidemia, hypertension.  Ann Held, DO

## 2016-11-26 NOTE — Patient Instructions (Signed)

## 2016-11-26 NOTE — Assessment & Plan Note (Addendum)
  Poorly controlled , encouraged DASH diet, minimize caffeine and obtain adequate sleep. Report concerning symptoms and follow up as directed and as needed

## 2016-11-27 LAB — LIPID PANEL
CHOLESTEROL: 239 mg/dL — AB (ref 0–200)
HDL: 75.5 mg/dL (ref >39.00)
LDL CALC: 142 mg/dL — AB (ref 0–99)
NonHDL: 163.43
Total CHOL/HDL Ratio: 3
Triglycerides: 106 mg/dL (ref 0.0–149.0)
VLDL: 21.2 mg/dL (ref 0.0–40.0)

## 2016-11-27 LAB — COMPREHENSIVE METABOLIC PANEL
ALBUMIN: 4.7 g/dL (ref 3.5–5.2)
ALT: 10 U/L (ref 0–35)
AST: 15 U/L (ref 0–37)
Alkaline Phosphatase: 58 U/L (ref 39–117)
BUN: 22 mg/dL (ref 6–23)
CHLORIDE: 95 meq/L — AB (ref 96–112)
CO2: 31 meq/L (ref 19–32)
CREATININE: 0.77 mg/dL (ref 0.40–1.20)
Calcium: 10.5 mg/dL (ref 8.4–10.5)
GFR: 78.65 mL/min (ref >60.00)
Glucose, Bld: 87 mg/dL (ref 70–99)
Potassium: 4.2 mEq/L (ref 3.5–5.1)
SODIUM: 134 meq/L — AB (ref 135–145)
Total Bilirubin: 0.6 mg/dL (ref 0.2–1.2)
Total Protein: 7.4 g/dL (ref 6.0–8.3)

## 2016-12-06 ENCOUNTER — Ambulatory Visit: Payer: Medicare Other | Admitting: Physical Therapy

## 2016-12-06 ENCOUNTER — Telehealth: Payer: Self-pay | Admitting: *Deleted

## 2016-12-06 NOTE — Telephone Encounter (Signed)
Received Medical records from [facility]; forwarded to provider/SLS 11/09

## 2016-12-09 DIAGNOSIS — F419 Anxiety disorder, unspecified: Secondary | ICD-10-CM | POA: Diagnosis not present

## 2016-12-09 DIAGNOSIS — F331 Major depressive disorder, recurrent, moderate: Secondary | ICD-10-CM | POA: Diagnosis not present

## 2016-12-10 ENCOUNTER — Telehealth: Payer: Self-pay | Admitting: *Deleted

## 2016-12-10 NOTE — Telephone Encounter (Signed)
Received Medical records from [facility]; forwarded to provider/SLS 11/13

## 2016-12-12 ENCOUNTER — Ambulatory Visit: Payer: Medicare Other

## 2016-12-13 ENCOUNTER — Ambulatory Visit: Payer: Medicare Other | Attending: Family Medicine | Admitting: Physical Therapy

## 2016-12-13 ENCOUNTER — Encounter: Payer: Self-pay | Admitting: Physical Therapy

## 2016-12-13 DIAGNOSIS — M6281 Muscle weakness (generalized): Secondary | ICD-10-CM | POA: Diagnosis not present

## 2016-12-13 DIAGNOSIS — M25571 Pain in right ankle and joints of right foot: Secondary | ICD-10-CM

## 2016-12-13 DIAGNOSIS — M25671 Stiffness of right ankle, not elsewhere classified: Secondary | ICD-10-CM | POA: Insufficient documentation

## 2016-12-13 DIAGNOSIS — R600 Localized edema: Secondary | ICD-10-CM | POA: Insufficient documentation

## 2016-12-13 NOTE — Therapy (Deleted)
[place], [place], [address] Phone: [phone]   Fax:  [phone]  Physical Therapy Evaluation  Patient Details  Name: Toni [name] MRN: [id number] Date of Birth: 1945/09/30 No Data Recorded  Encounter Date: 12/13/2016    Past Medical History:  Diagnosis Date  . Bipolar disorder ([place])    pt denies  . COPD (chronic obstructive pulmonary disease) ([place])   . Depression   . Diverticulitis   . Diverticulitis   . Fibromyalgia   . Gallstones 11/09/2015  . GERD (gastroesophageal reflux disease)   . Headache   . Hyperlipidemia   . Hypertension   . Osteoarthritis     Past Surgical History:  Procedure Laterality Date  . ANKLE ARTHROSCOPY WITH OPEN REDUCTION INTERNAL FIXATION (ORIF) Right 06/20/2016   second surgery 06/28/2016 in [place]  . COLON RESECTION  2001   sigmoid colon removed  . COLOSTOMY CLOSURE  2011  . FEMUR FRACTURE SURGERY  09/2009  . JOINT REPLACEMENT Right 09/2009   R hip,  redone, 06/14/2010 [place],---  done secondary to fractured fractured x2  . LAPAROSCOPIC CHOLECYSTECTOMY WITH INTRAOPERATIVE CHOLANGIOGRAM N/A 12/04/2015   Performed by Fanny Skates, MD at [facility]  . TONSILLECTOMY AND ADENOIDECTOMY    . TOTAL HIP ARTHROPLASTY Left    1979 and 1986    There were no vitals filed for this visit.   Subjective Assessment - 12/13/16 0941    Subjective  PT. is 71 yo female sliped and fell May 23, dislocated ankle broken in 3 places. P    How long can you sit comfortably?  unlimited     How long can you stand comfortably?  30 minutes     How long can you walk comfortably?  5-10 minutes     Diagnostic tests  x-rays, discolocation     Patient Stated Goals  pain relief, get back movement, "get nerves back"     Currently in Pain?  Yes    Pain Score  4     Pain Location  Ankle    Pain Orientation  Right    Pain Descriptors / Indicators  Shooting stiff    Pain Type  Chronic pain    Pain Onset  More than a month ago    Pain Frequency  Constant    Aggravating Factors   movement    Pain Relieving Factors  rest,          [facility] PT Assessment - 12/13/16 0942      Assessment   Medical Diagnosis  R ankle ORIF    Onset Date/Surgical Date  10/14/16    Hand Dominance  Right  (Pended)     Next MD Visit  No  (Pended)     Prior Therapy  Yes   (Pended)       Balance Screen   Has the patient fallen in the past 6 months  Yes  (Pended)     How many times?  1  (Pended)     Has the patient had a decrease in activity level because of a fear of falling?   No  (Pended)     Is the patient reluctant to leave their home because of a fear of falling?   No  (Pended)       Home Environment   Living Environment  Private residence  (Pended)       Observation/Other Assessments   Focus on Therapeutic Outcomes (FOTO)   60% limited  (Pended)  Observation/Other Assessments-Edema    Edema  Figure 8  (Pended)       ROM / Strength   AROM / PROM / Strength  AROM;Strength  (Pended)       AROM   AROM Assessment Site  Ankle  (Pended)     Right/Left Ankle  Right  (Pended)     Right Ankle Dorsiflexion  80  (Pended)     Right Ankle Plantar Flexion  59  (Pended)     Right Ankle Inversion  58  (Pended)     Right Ankle Eversion  19  (Pended)  pain at end range       Strength   Strength Assessment Site  Ankle  (Pended)     Right/Left Ankle  Right  (Pended)     Right Ankle Dorsiflexion  4/5  (Pended)     Right Ankle Plantar Flexion  4+/5  (Pended)     Right Ankle Inversion  4-/5  (Pended)  with pain     Right Ankle Eversion  4-/5  (Pended)  with pain             Objective measurements completed on examination: See above findings.                            Patient will benefit from skilled therapeutic intervention in order to improve the following deficits and impairments:     Visit Diagnosis: No diagnosis found.     Problem List Patient Active  Problem List   Diagnosis Date Noted  . Closed right ankle fracture, with routine healing, subsequent encounter 11/26/2016  . Pre-operative cardiovascular examination 12/01/2015  . Gallstones 11/09/2015  . Diverticulitis of colon 08/24/2015  . Severe depression ([place]) 08/08/2014  . Dyspnea on effort 01/11/2013  . Chest discomfort 01/11/2013  . Marijuana abuse 04/17/2011  . UNSPECIFIED VITAMIN D DEFICIENCY 02/08/2010  . UNSPECIFIED ANEMIA 02/08/2010  . CLOSED FRACTURE OF SHAFT OF FEMUR 02/08/2010  . ALCOHOL ABUSE, IN REMISSION, HX OF 09/30/2009  . UNSPECIFIED CONJUNCTIVITIS 09/13/2009  . WEAKNESS 10/10/2008  . HEARTBURN 03/25/2008  . GERD 03/24/2008  . OTHER DYSPHAGIA 03/24/2008  . MUSCLE PAIN 01/07/2008  . FOOT PAIN, LEFT 01/07/2008  . TINEA CORPORIS 01/04/2008  . Essential hypertension 01/04/2008  . LOW BACK PAIN, ACUTE 12/13/2006  . CERVICAL STRAIN 12/13/2006  . POSTMENOPAUSAL STATUS 12/09/2006  . Hyperlipidemia 12/08/2006  . Bipolar 1 disorder ([place]) 12/08/2006  . COPD 12/08/2006  . OSTEOARTHRITIS 12/08/2006  . Personal history of other diseases of digestive system 12/08/2006  . TONSILLECTOMY AND ADENOIDECTOMY, HX OF 12/08/2006    [place] 12/13/2016, 10:11 AM  [facility] [address] [place], [place], [address] Phone: [phone]   Fax:  [phone]  Name: Toni [name] MRN: [id number] Date of Birth: Feb 19, 1945

## 2016-12-13 NOTE — Therapy (Signed)
[place], [place], [address] Phone: [phone]   Fax:  [phone]  Physical Therapy Evaluation  Patient Details  Name: Toni [name] MRN: [id number] Date of Birth: 05-Feb-1945 No Data Recorded  Encounter Date: 12/13/2016    Past Medical History:  Diagnosis Date  . Bipolar disorder ([place])    pt denies  . COPD (chronic obstructive pulmonary disease) ([place])   . Depression   . Diverticulitis   . Diverticulitis   . Fibromyalgia   . Gallstones 11/09/2015  . GERD (gastroesophageal reflux disease)   . Headache   . Hyperlipidemia   . Hypertension   . Osteoarthritis     Past Surgical History:  Procedure Laterality Date  . ANKLE ARTHROSCOPY WITH OPEN REDUCTION INTERNAL FIXATION (ORIF) Right 06/20/2016   second surgery 06/28/2016 in [place]  . COLON RESECTION  2001   sigmoid colon removed  . COLOSTOMY CLOSURE  2011  . FEMUR FRACTURE SURGERY  09/2009  . JOINT REPLACEMENT Right 09/2009   R hip,  redone, 06/14/2010 [place], [place],---  done secondary to fractured fractured x2  . LAPAROSCOPIC CHOLECYSTECTOMY WITH INTRAOPERATIVE CHOLANGIOGRAM N/A 12/04/2015   Performed by Fanny Skates, MD at [facility]  . TONSILLECTOMY AND ADENOIDECTOMY    . TOTAL HIP ARTHROPLASTY Left    1979 and 1986    There were no vitals filed for this visit.   Subjective Assessment - 12/13/16 0941    Subjective  PT. is 71 yo female sliped and fell May 23, dislocated ankle broken in 3 places. P    How long can you sit comfortably?  unlimited     How long can you stand comfortably?  30 minutes     How long can you walk comfortably?  5-10 minutes     Diagnostic tests  x-rays, discolocation     Patient Stated Goals  pain relief, get back movement, "get nerves back"     Currently in Pain?  Yes    Pain Score  4     Pain Location  Ankle    Pain Orientation  Right    Pain Descriptors / Indicators  Shooting stiff    Pain Type  Chronic pain    Pain Onset  More than a month ago    Pain Frequency  Constant    Aggravating Factors   movement    Pain Relieving Factors  rest,          [facility] PT Assessment - 12/13/16 0942      Assessment   Medical Diagnosis  R ankle ORIF    Onset Date/Surgical Date  10/14/16    Hand Dominance  Right  (Pended)     Next MD Visit  No  (Pended)     Prior Therapy  Yes   (Pended)       Balance Screen   Has the patient fallen in the past 6 months  Yes  (Pended)     How many times?  1  (Pended)     Has the patient had a decrease in activity level because of a fear of falling?   No  (Pended)     Is the patient reluctant to leave their home because of a fear of falling?   No  (Pended)       Home Environment   Living Environment  Private residence  (Pended)       Observation/Other Assessments   Focus on Therapeutic Outcomes (FOTO)   60% limited  (Pended)  Observation/Other Assessments-Edema    Edema  Figure 8  (Pended)       ROM / Strength   AROM / PROM / Strength  AROM;Strength  (Pended)       AROM   AROM Assessment Site  Ankle  (Pended)     Right/Left Ankle  Right  (Pended)     Right Ankle Dorsiflexion  80  (Pended)     Right Ankle Plantar Flexion  59  (Pended)     Right Ankle Inversion  58  (Pended)     Right Ankle Eversion  19  (Pended)  pain at end range       Strength   Strength Assessment Site  Ankle  (Pended)     Right/Left Ankle  Right  (Pended)     Right Ankle Dorsiflexion  4/5  (Pended)     Right Ankle Plantar Flexion  4+/5  (Pended)     Right Ankle Inversion  4-/5  (Pended)  with pain     Right Ankle Eversion  4-/5  (Pended)  with pain             Objective measurements completed on examination: See above findings.                          Screening for Suicide  Answer the following questions with Yes or No and place an "x" beside the action taken.  1. Over the past two weeks, have you felt down, depressed, or hopeless?   yes  2.  Within the past two weeks, have you felt little interest or pleasure in life?  yes  If YES to either #1 or #2, then ask #3  3. Have you had thoughts that that life is not worth living or that you might be       better off dead?   no  If answer is NO and suspicion is low, then end   4. Over this past week, have you had any thoughts about hurting or even killing yourself?  Yes over the past 6 months  If NO, then end. Patient in no immediate danger   5. If so, do you believe that you intend to or will harm yourself?  no     If NO, then end. Patient in no immediate danger   6.  Do you have a plan as to how you would hurt yourself?  No   7.  Over this past week, have you actually done anything to hurt yourself? no   IF YES answers to either #4, #5, #6 or #7, then patient is AT RISK for suicide   Actions Taken  ____  Screening negative; no further action required  X       Screening positive; no immediate danger and patient already in treatment with a  mental health provider. Advise patient to speak to their mental health provider.  ____  Screening positive; no immediate danger. Patient advised to contact a mental  health provider for further assessment.   ____  Screening positive; in immediate danger as patient states intention of killing self,  has plan and a sense of imminence. Do not leave alone. Seek permission from  patient to contact a family member to inform them. Direct patient to go to ED.     Patient will benefit from skilled therapeutic intervention in order to improve the following deficits and impairments:     Visit Diagnosis: No diagnosis found.  Problem List Patient Active Problem List   Diagnosis Date Noted  . Closed right ankle fracture, with routine healing, subsequent encounter 11/26/2016  . Pre-operative cardiovascular examination 12/01/2015  . Gallstones 11/09/2015  . Diverticulitis of colon 08/24/2015  . Severe depression ([place])  08/08/2014  . Dyspnea on effort 01/11/2013  . Chest discomfort 01/11/2013  . Marijuana abuse 04/17/2011  . UNSPECIFIED VITAMIN D DEFICIENCY 02/08/2010  . UNSPECIFIED ANEMIA 02/08/2010  . CLOSED FRACTURE OF SHAFT OF FEMUR 02/08/2010  . ALCOHOL ABUSE, IN REMISSION, HX OF 09/30/2009  . UNSPECIFIED CONJUNCTIVITIS 09/13/2009  . WEAKNESS 10/10/2008  . HEARTBURN 03/25/2008  . GERD 03/24/2008  . OTHER DYSPHAGIA 03/24/2008  . MUSCLE PAIN 01/07/2008  . FOOT PAIN, LEFT 01/07/2008  . TINEA CORPORIS 01/04/2008  . Essential hypertension 01/04/2008  . LOW BACK PAIN, ACUTE 12/13/2006  . CERVICAL STRAIN 12/13/2006  . POSTMENOPAUSAL STATUS 12/09/2006  . Hyperlipidemia 12/08/2006  . Bipolar 1 disorder ([place]) 12/08/2006  . COPD 12/08/2006  . OSTEOARTHRITIS 12/08/2006  . Personal history of other diseases of digestive system 12/08/2006  . TONSILLECTOMY AND ADENOIDECTOMY, HX OF 12/08/2006    [place] 12/13/2016, 10:13 AM  [facility] [address] [place], [place], [address] Phone: [phone]   Fax:  [phone]  Name: Yashica Sterbenz MRN: [id number] Date of Birth: 1945-07-04

## 2016-12-13 NOTE — Therapy (Signed)
[place], [place], [address] Phone: [phone]   Fax:  [phone]  Physical Therapy Evaluation  Patient Details  Name: Toni [name] MRN: [id number] Date of Birth: 12-18-1945 Referring Provider: Ann Held, DO   Encounter Date: 12/13/2016  PT End of Session - 12/13/16 1033    Visit Number  1    Number of Visits  12    Date for PT Re-Evaluation  01/10/17    Authorization Type  MCR KX by 15th visit, progress note by 10th visit    PT Start Time  0933    PT Stop Time  1019    PT Time Calculation (min)  46 min    Activity Tolerance  Patient tolerated treatment well    Behavior During Therapy  [facility] for tasks assessed/performed pt. began crying during evaluation; hx of depression       Past Medical History:  Diagnosis Date  . Bipolar disorder ([place])    pt denies  . COPD (chronic obstructive pulmonary disease) ([place])   . Depression   . Diverticulitis   . Diverticulitis   . Fibromyalgia   . Gallstones 11/09/2015  . GERD (gastroesophageal reflux disease)   . Headache   . Hyperlipidemia   . Hypertension   . Osteoarthritis     Past Surgical History:  Procedure Laterality Date  . ANKLE ARTHROSCOPY WITH OPEN REDUCTION INTERNAL FIXATION (ORIF) Right 06/20/2016   second surgery 06/28/2016 in [place]  . COLON RESECTION  2001   sigmoid colon removed  . COLOSTOMY CLOSURE  2011  . FEMUR FRACTURE SURGERY  09/2009  . JOINT REPLACEMENT Right 09/2009   R hip,  redone, 06/14/2010 [place], tx,---  done secondary to fractured fractured x2  . LAPAROSCOPIC CHOLECYSTECTOMY WITH INTRAOPERATIVE CHOLANGIOGRAM N/A 12/04/2015   Performed by Fanny Skates, MD at [facility]  . TONSILLECTOMY AND ADENOIDECTOMY    . TOTAL HIP ARTHROPLASTY Left    1979 and 1986    There were no vitals filed for this visit.   Subjective Assessment - 12/13/16 0941    Subjective  Pt. is 71 yo female who slipped and fell dislocating her  ankle in May 2018. Pt. reports the fall required a series of surgeries the first being 06/19/16 and the most recent happening on 10/20/2016. Pt. reports receiving therapy previously for ankle, however not being compliant with HEP due to depression. Pt. currently reports pain of 4/10 in rt ankle that remains localized but is aggravated by movement.    Pertinent History  history of depression     How long can you sit comfortably?  unlimited     How long can you stand comfortably?  30 minutes     How long can you walk comfortably?  5-10 minutes     Diagnostic tests  x-rays, discolocation     Patient Stated Goals  pain relief, regain movement, "get nerves back"     Currently in Pain?  Yes    Pain Score  4     Pain Location  Ankle    Pain Orientation  Right    Pain Descriptors / Indicators  Shooting stiff    Pain Type  Chronic pain    Pain Onset  More than a month ago    Pain Frequency  Constant    Aggravating Factors   movement    Pain Relieving Factors  rest        [facility] PT Assessment - 12/13/16 0814  Assessment   Medical Diagnosis  R ankle ORIF    Referring Provider  Ann Held, DO    Onset Date/Surgical Date  10/14/16    Hand Dominance  Right    Next MD Visit  No    Prior Therapy  Yes       Precautions   Precautions  None      Restrictions   Weight Bearing Restrictions  No      Balance Screen   Has the patient fallen in the past 6 months  Yes    How many times?  1    Has the patient had a decrease in activity level because of a fear of falling?   No    Is the patient reluctant to leave their home because of a fear of falling?   No      Home Environment   Living Environment  Private residence    Living Arrangements  Alone    Type of [place] to enter    Home Layout  One level    [place] - 4 wheels;Cane - single point      Prior Function   Level of Independence  Independent      Cognition   Overall Cognitive  Status  Within Functional Limits for tasks assessed      Observation/Other Assessments   Focus on Therapeutic Outcomes (FOTO)   60% limited predicted to be 48% limited       Observation/Other Assessments-Edema    Edema  Figure 8      Figure 8 Edema   Figure 8 - Right   Rt. ankle; 49.5 cm  visable swelling       ROM / Strength   AROM / PROM / Strength  AROM;Strength      AROM   AROM Assessment Site  Ankle    Right/Left Ankle  Right    Right Ankle Dorsiflexion  80    Right Ankle Plantar Flexion  59    Right Ankle Inversion  28    Right Ankle Eversion  19 pain at end range       Strength   Strength Assessment Site  Ankle    Right/Left Ankle  Right    Right Ankle Dorsiflexion  4/5    Right Ankle Plantar Flexion  4+/5    Right Ankle Inversion  4-/5 with pain     Right Ankle Eversion  4-/5 with pain      Palpation   Palpation comment  TTP over rt. sinus tarsi, medial and lateral malleolus, rt. navicular and cuboid, palpable edema over rt. lateral malleolus       Ambulation/Gait   Ambulation/Gait  --    Gait Pattern  Trendelenburg;Antalgic;Decreased dorsiflexion - right;Step-to pattern;Decreased stance time - right pt. walks with [facility]      Functional Gait  Assessment   Gait assessed   --       Objective measurements completed on examination: See above findings.     PT Education - 12/13/16 1032    Education provided  Yes    Education Details  Pt. educated on POC, initial HEP    Person(s) Educated  Patient    Methods  Explanation;Handout;Demonstration    Comprehension  Verbalized understanding       PT Short Term Goals - 12/13/16 1101      PT SHORT TERM GOAL #1   Title  Pt. will be  independent and adherent with I HEP.    Time  3    Period  Weeks    Status  New      PT SHORT TERM GOAL #2   Title  Pt. will demonstrate decreased ankle swelling >/= 5 cm to facilitate improved mobility.    Time  3    Period  Weeks    Status  New       PT Long Term Goals -  12/13/16 1107      PT LONG TERM GOAL #1   Title  Pt. will increase ankle DF >/= 10 degrees to move WFL to promote functional gait pattern.     Time  6    Period  Weeks    Status  New    Target Date  01/24/17      PT LONG TERM GOAL #2   Title  Pt. will increase overall ankle strength to >/= 4+/5 to display adequate strength to support LE during gait.    Time  6    Period  Weeks    Status  New    Target Date  01/24/17      PT LONG TERM GOAL #3   Title  Pt. will be independent with all prescribed HEP as of last visit.    Time  6    Period  Weeks    Status  New    Target Date  01/24/17      PT LONG TERM GOAL #4   Title  pt be able to walk for >/= 30 min with LRAD with </=2/10 pain to promote functional mobility and community amb    Time  6    Period  Weeks    Status  New    Target Date  01/24/17         Plan - 12/13/16 1035    Clinical Impression Statement  Pt. is 71 yo female referred to OPPT for rt. ankle pain after series of ankle surgeries. During the assessment pt. was emotional when recounting her history of depression and was positive for depression screening questions. Pt. reports seeing provider for mental health services, however depression presents a potential barrier to treatment. Pt. was limited in ankle ROM in multiple planes and displayed strength deficits. Pt. also had visible swelling over rt. lateral malleolus. She was TTP over both medial and lateral malleoli on the rt. ankle as well as the sinus tarsi, cuboid, and navicular (rt. side). Pt. will benefit from physical therapy to address deficits in ROM and strength for improved functional mobility.    History and Personal Factors relevant to plan of care:  hx of depression, osteoarthritis, fibromyalgia    Clinical Presentation  Evolving    Clinical Presentation due to:  pain, limited ROM, gait instability, increased edema     Clinical Decision Making  Moderate    Rehab Potential  Good    Clinical Impairments  Affecting Rehab Potential  hx of depression    PT Frequency  2x / week    PT Duration  6 weeks    PT Treatment/Interventions  ADLs/Self Care Home Management;Electrical Stimulation;Iontophoresis 4mg /ml Dexamethasone;Moist Heat;Gait training;Functional mobility training;Therapeutic exercise;Balance training;Patient/family education;Manual techniques;Passive range of motion;Taping;Vasopneumatic Device    PT Next Visit Plan  assess PROM, assess HEP, joint mobilization rt. ankle (all motions), strengthening of supporting ankle musculature, gastroc stretching,     PT Home Exercise Plan  standing gastroc stretch; 4-way ankle with theraband; Ankle inversion/eversion AROM    Recommended  Other Services  mental health services  pt. reports seeing provider for depression    Consulted and Agree with Plan of Care  Patient       Patient will benefit from skilled therapeutic intervention in order to improve the following deficits and impairments:  Abnormal gait, Hypomobility, Increased edema, Decreased activity tolerance, Decreased strength, Pain, Decreased mobility, Decreased range of motion  Visit Diagnosis: Pain in right ankle and joints of right foot  Stiffness of right ankle, not elsewhere classified  Muscle weakness (generalized)  Localized edema     Problem List Patient Active Problem List   Diagnosis Date Noted  . Closed right ankle fracture, with routine healing, subsequent encounter 11/26/2016  . Pre-operative cardiovascular examination 12/01/2015  . Gallstones 11/09/2015  . Diverticulitis of colon 08/24/2015  . Severe depression ([place]) 08/08/2014  . Dyspnea on effort 01/11/2013  . Chest discomfort 01/11/2013  . Marijuana abuse 04/17/2011  . UNSPECIFIED VITAMIN D DEFICIENCY 02/08/2010  . UNSPECIFIED ANEMIA 02/08/2010  . CLOSED FRACTURE OF SHAFT OF FEMUR 02/08/2010  . ALCOHOL ABUSE, IN REMISSION, HX OF 09/30/2009  . UNSPECIFIED CONJUNCTIVITIS 09/13/2009  . WEAKNESS 10/10/2008  .  HEARTBURN 03/25/2008  . GERD 03/24/2008  . OTHER DYSPHAGIA 03/24/2008  . MUSCLE PAIN 01/07/2008  . FOOT PAIN, LEFT 01/07/2008  . TINEA CORPORIS 01/04/2008  . Essential hypertension 01/04/2008  . LOW BACK PAIN, ACUTE 12/13/2006  . CERVICAL STRAIN 12/13/2006  . POSTMENOPAUSAL STATUS 12/09/2006  . Hyperlipidemia 12/08/2006  . Bipolar 1 disorder ([place]) 12/08/2006  . COPD 12/08/2006  . OSTEOARTHRITIS 12/08/2006  . Personal history of other diseases of digestive system 12/08/2006  . TONSILLECTOMY AND ADENOIDECTOMY, HX OF 12/08/2006     Eulis Manly, SPT 12/13/16 12:06 PM    [facility] [address] [place], [place], [address] Phone: [phone]   Fax:  [phone]  Name: Myesha Stillion MRN: [id number] Date of Birth: [birth year]

## 2016-12-18 DIAGNOSIS — F331 Major depressive disorder, recurrent, moderate: Secondary | ICD-10-CM | POA: Diagnosis not present

## 2016-12-24 ENCOUNTER — Ambulatory Visit: Payer: Medicare Other | Admitting: Physical Therapy

## 2016-12-26 ENCOUNTER — Ambulatory Visit: Payer: Medicare Other | Admitting: Physical Therapy

## 2016-12-26 DIAGNOSIS — M25671 Stiffness of right ankle, not elsewhere classified: Secondary | ICD-10-CM

## 2016-12-26 DIAGNOSIS — R600 Localized edema: Secondary | ICD-10-CM | POA: Diagnosis not present

## 2016-12-26 DIAGNOSIS — F419 Anxiety disorder, unspecified: Secondary | ICD-10-CM | POA: Diagnosis not present

## 2016-12-26 DIAGNOSIS — M6281 Muscle weakness (generalized): Secondary | ICD-10-CM

## 2016-12-26 DIAGNOSIS — F331 Major depressive disorder, recurrent, moderate: Secondary | ICD-10-CM | POA: Diagnosis not present

## 2016-12-26 DIAGNOSIS — M25571 Pain in right ankle and joints of right foot: Secondary | ICD-10-CM | POA: Diagnosis not present

## 2016-12-26 NOTE — Therapy (Signed)
[place], [place], [address] Phone: [phone]   Fax:  [phone]  Physical Therapy Treatment  Patient Details  Name: Toni [name] MRN: [id number] Date of Birth: [birth year] Referring Provider: Ann Held, DO   Encounter Date: 12/26/2016  PT End of Session - 12/26/16 1047    Visit Number  2    Number of Visits  12    Date for PT Re-Evaluation  01/10/17    Authorization Type  MCR KX by 15th visit, progress note by 10th visit    PT Start Time  0845    PT Stop Time  0935    PT Time Calculation (min)  50 min    Activity Tolerance  Patient tolerated treatment well    Behavior During Therapy  [facility] for tasks assessed/performed pt. began crying during evaluation; hx of depression       Past Medical History:  Diagnosis Date  . Bipolar disorder ([place])    pt denies  . COPD (chronic obstructive pulmonary disease) ([place])   . Depression   . Diverticulitis   . Diverticulitis   . Fibromyalgia   . Gallstones 11/09/2015  . GERD (gastroesophageal reflux disease)   . Headache   . Hyperlipidemia   . Hypertension   . Osteoarthritis     Past Surgical History:  Procedure Laterality Date  . ANKLE ARTHROSCOPY WITH OPEN REDUCTION INTERNAL FIXATION (ORIF) Right 06/20/2016   second surgery 06/28/2016 in [place]  . CHOLECYSTECTOMY N/A 12/04/2015   Procedure: LAPAROSCOPIC CHOLECYSTECTOMY WITH INTRAOPERATIVE CHOLANGIOGRAM;  Surgeon: Fanny Skates, MD;  Location: [place];  Service: General;  Laterality: N/A;  . COLON RESECTION  2001   sigmoid colon removed  . COLOSTOMY CLOSURE  2011  . FEMUR FRACTURE SURGERY  09/2009  . JOINT REPLACEMENT Right 09/2009   R hip,  redone, 06/14/2010 [place],---  done secondary to fractured fractured x2  . TONSILLECTOMY AND ADENOIDECTOMY    . TOTAL HIP ARTHROPLASTY Left    1979 and 1986    There were no vitals filed for this visit.  Subjective Assessment - 12/26/16 1026    Subjective  Pt. reports with current 1/10 pain that increases to 2-3/10 with movement. Pt. reports feeling ill over the past week which "knocked me out for 4 days" and not being able to complete prescibed HEP.     Pertinent History  history of depression     Currently in Pain?  Yes    Pain Score  1  at rest, becomes 2-3/10 with movement    Pain Location  Ankle    Pain Orientation  Right    Pain Onset  More than a month ago        [facility] Adult PT Treatment/Exercise - 12/26/16 1030      Cryotherapy   Number Minutes Cryotherapy  10 Minutes    Cryotherapy Location  Ankle right    Type of Cryotherapy  Ice pack      Manual Therapy   Manual Therapy  Joint mobilization    Joint Mobilization  P-A glide talocrual joint to promote plantarflexion, supine, grade 3-4, 45-60 second bouts, 10 bouts; A-P glide talocrual joint to promote dorsiflexion, grade 3-4, supine, 45-60 bouts, 10 bouts; subtalar joint distraction to promote general mobility working into inversion/eversion      Ankle Exercises: Stretches   Gastroc Stretch  5 reps;30 seconds right ankle, using contract-relax, pt. in supine      Ankle Exercises: Supine  Other Supine Ankle Exercises  resisted dorsiflexion using red theraband; 1 set; 10 reps; hold 10 seconds       PT Education - 12/26/16 1046    Education provided  Yes    Education Details  Pt. education on joint mobilization and ankle anatomy, desensitization technique for pain relief     Person(s) Educated  Patient    Methods  Explanation    Comprehension  Verbalized understanding       PT Short Term Goals - 12/13/16 1101      PT SHORT TERM GOAL #1   Title  Pt. will be independent and adherent with I HEP.    Time  3    Period  Weeks    Status  New      PT SHORT TERM GOAL #2   Title  Pt. will demonstrate decreased ankle swelling >/= 5 cm to facilitate improved mobility.    Time  3    Period  Weeks    Status  New       PT Long Term Goals - 12/13/16 1107      PT  LONG TERM GOAL #1   Title  Pt. will increase ankle DF >/= 10 degrees to move WFL to promote functional gait pattern.     Time  6    Period  Weeks    Status  New    Target Date  01/24/17      PT LONG TERM GOAL #2   Title  Pt. will increase overall ankle strength to >/= 4+/5 to display adequate strength to support LE during gait.    Time  6    Period  Weeks    Status  New    Target Date  01/24/17      PT LONG TERM GOAL #3   Title  Pt. will be independent with all prescribed HEP as of last visit.    Time  6    Period  Weeks    Status  New    Target Date  01/24/17      PT LONG TERM GOAL #4   Title  pt be able to walk for >/= 30 min with LRAD with </=2/10 pain to promote functional mobility and community amb    Time  6    Period  Weeks    Status  New    Target Date  01/24/17       Plan - 12/26/16 1047    Clinical Impression Statement  Pt. presents to clinic with 1/10 pain at rest that is aggravated by movement. Session predominantly consisted of joint mobilization to promote movement of rt ankle. Treatment also included strengthening for stability and stretching. Pt. remains TTP over medial malleolus and was taught desensitizing technique for pain relief. Pt. noted reduced pain during joint mobilization but increased pain post-session and was given ice after treatment.     Rehab Potential  Good    Clinical Impairments Affecting Rehab Potential  hx of depression    PT Frequency  2x / week    PT Duration  6 weeks    PT Treatment/Interventions  ADLs/Self Care Home Management;Electrical Stimulation;Iontophoresis 4mg /ml Dexamethasone;Moist Heat;Gait training;Functional mobility training;Therapeutic exercise;Balance training;Patient/family education;Manual techniques;Passive range of motion;Taping;Vasopneumatic Device    PT Next Visit Plan  review HEP, joint mobilization rt. ankle (all motions), strengthening of supporting ankle musculature, gastroc stretching    PT Home Exercise Plan   standing gastroc stretch; 4-way ankle with theraband; Ankle inversion/eversion AROM  Consulted and Agree with Plan of Care  Patient       Patient will benefit from skilled therapeutic intervention in order to improve the following deficits and impairments:  Abnormal gait, Hypomobility, Increased edema, Decreased activity tolerance, Decreased strength, Pain, Decreased mobility, Decreased range of motion  Visit Diagnosis: Pain in right ankle and joints of right foot  Stiffness of right ankle, not elsewhere classified  Muscle weakness (generalized)  Localized edema   Problem List Patient Active Problem List   Diagnosis Date Noted  . Closed right ankle fracture, with routine healing, subsequent encounter 11/26/2016  . Pre-operative cardiovascular examination 12/01/2015  . Gallstones 11/09/2015  . Diverticulitis of colon 08/24/2015  . Severe depression ([place]) 08/08/2014  . Dyspnea on effort 01/11/2013  . Chest discomfort 01/11/2013  . Marijuana abuse 04/17/2011  . UNSPECIFIED VITAMIN D DEFICIENCY 02/08/2010  . UNSPECIFIED ANEMIA 02/08/2010  . CLOSED FRACTURE OF SHAFT OF FEMUR 02/08/2010  . ALCOHOL ABUSE, IN REMISSION, HX OF 09/30/2009  . UNSPECIFIED CONJUNCTIVITIS 09/13/2009  . WEAKNESS 10/10/2008  . HEARTBURN 03/25/2008  . GERD 03/24/2008  . OTHER DYSPHAGIA 03/24/2008  . MUSCLE PAIN 01/07/2008  . FOOT PAIN, LEFT 01/07/2008  . TINEA CORPORIS 01/04/2008  . Essential hypertension 01/04/2008  . LOW BACK PAIN, ACUTE 12/13/2006  . CERVICAL STRAIN 12/13/2006  . POSTMENOPAUSAL STATUS 12/09/2006  . Hyperlipidemia 12/08/2006  . Bipolar 1 disorder ([place]) 12/08/2006  . COPD 12/08/2006  . OSTEOARTHRITIS 12/08/2006  . Personal history of other diseases of digestive system 12/08/2006  . TONSILLECTOMY AND ADENOIDECTOMY, HX OF 12/08/2006    Eulis Manly, SPT 12/26/16 10:58 AM   [facility] [address] [place], [place],  [address] Phone: [phone]   Fax:  [phone]  Name: Toni [name] MRN: [id number] Date of Birth: 1945/03/07

## 2016-12-30 ENCOUNTER — Encounter: Payer: Self-pay | Admitting: Physical Therapy

## 2016-12-30 ENCOUNTER — Ambulatory Visit: Payer: Medicare Other | Attending: Family Medicine | Admitting: Physical Therapy

## 2016-12-30 DIAGNOSIS — M25671 Stiffness of right ankle, not elsewhere classified: Secondary | ICD-10-CM | POA: Diagnosis not present

## 2016-12-30 DIAGNOSIS — M25571 Pain in right ankle and joints of right foot: Secondary | ICD-10-CM | POA: Diagnosis not present

## 2016-12-30 DIAGNOSIS — R600 Localized edema: Secondary | ICD-10-CM | POA: Insufficient documentation

## 2016-12-30 DIAGNOSIS — M6281 Muscle weakness (generalized): Secondary | ICD-10-CM | POA: Insufficient documentation

## 2016-12-30 NOTE — Therapy (Signed)
[place], [place], [address] Phone: [phone]   Fax:  [phone]  Physical Therapy Treatment  Patient Details  Name: Toni [name] MRN: [id number] Date of Birth: Jun 24, 1945 Referring Provider: Ann Held, DO   Encounter Date: 12/30/2016  PT End of Session - 12/30/16 1828    Visit Number  3    Number of Visits  12    Date for PT Re-Evaluation  01/10/17    PT Start Time  3500    PT Stop Time  1427    PT Time Calculation (min)  54 min    Activity Tolerance  Patient tolerated treatment well    Behavior During Therapy  [facility] for tasks assessed/performed       Past Medical History:  Diagnosis Date  . Bipolar disorder ([place])    pt denies  . COPD (chronic obstructive pulmonary disease) ([place])   . Depression   . Diverticulitis   . Diverticulitis   . Fibromyalgia   . Gallstones 11/09/2015  . GERD (gastroesophageal reflux disease)   . Headache   . Hyperlipidemia   . Hypertension   . Osteoarthritis     Past Surgical History:  Procedure Laterality Date  . ANKLE ARTHROSCOPY WITH OPEN REDUCTION INTERNAL FIXATION (ORIF) Right 06/20/2016   second surgery 06/28/2016 in [place]  . CHOLECYSTECTOMY N/A 12/04/2015   Procedure: LAPAROSCOPIC CHOLECYSTECTOMY WITH INTRAOPERATIVE CHOLANGIOGRAM;  Surgeon: Fanny Skates, MD;  Location: [place];  Service: General;  Laterality: N/A;  . COLON RESECTION  2001   sigmoid colon removed  . COLOSTOMY CLOSURE  2011  . FEMUR FRACTURE SURGERY  09/2009  . JOINT REPLACEMENT Right 09/2009   R hip,  redone, 06/14/2010 [place],---  done secondary to fractured fractured x2  . TONSILLECTOMY AND ADENOIDECTOMY    . TOTAL HIP ARTHROPLASTY Left    1979 and 1986    There were no vitals filed for this visit.  Subjective Assessment - 12/30/16 1340    Subjective  1-2/10 .  lateral   foot,,  cane mostly for balance.  Uses brace most of the time.  Sleeps in brace sometimes.      Pain Score  2     Pain Orientation  Right    Pain Descriptors / Indicators  Aching;Numbness foot numb    Pain Frequency  Intermittent    Aggravating Factors   moving    Pain Relieving Factors  rest,    Multiple Pain Sites  -- back pain  today   4-5/10  (long standing)                      [facility] Adult PT Treatment/Exercise - 12/30/16 0001      Cryotherapy   Number Minutes Cryotherapy  10 Minutes    Cryotherapy Location  Ankle    Type of Cryotherapy  -- cold pack      Ankle Exercises: Seated   Other Seated Ankle Exercises  AROM Supination/pronation   10 x ach,  mildly painful 3 way band exercises with green band    Other Seated Ankle Exercises  Toe YOGA,  2 positions,  difficult 13 marble pick up, medial/ lateral foot.      Ankle Exercises: Stretches   Other Stretch  PRO stretch 3 minutes               PT Short Term Goals - 12/13/16 1101      PT SHORT TERM GOAL #1  Title  Pt. will be independent and adherent with I HEP.    Time  3    Period  Weeks    Status  New      PT SHORT TERM GOAL #2   Title  Pt. will demonstrate decreased ankle swelling >/= 5 cm to facilitate improved mobility.    Time  3    Period  Weeks    Status  New        PT Long Term Goals - 12/13/16 1107      PT LONG TERM GOAL #1   Title  Pt. will increase ankle DF >/= 10 degrees to move WFL to promote functional gait pattern.     Time  6    Period  Weeks    Status  New    Target Date  01/24/17      PT LONG TERM GOAL #2   Title  Pt. will increase overall ankle strength to >/= 4+/5 to display adequate strength to support LE during gait.    Time  6    Period  Weeks    Status  New    Target Date  01/24/17      PT LONG TERM GOAL #3   Title  Pt. will be independent with all prescribed HEP as of last visit.    Time  6    Period  Weeks    Status  New    Target Date  01/24/17      PT LONG TERM GOAL #4   Title  pt be able to walk for >/= 30 min with LRAD with </=2/10 pain to  promote functional mobility and community amb    Time  6    Period  Weeks    Status  New    Target Date  01/24/17            Plan - 12/30/16 1829    Clinical Impression Statement  Patient requires extra time with exercises .  Some exercises brought back  memories of her Mother and she would have bouts of crying.  Patient is easily distracted and needs cues to keep on task.  Patient is non compliant with her HEP.  Pain 3-4/10 at end of session prior to cold pack.    PT Treatment/Interventions  ADLs/Self Care Home Management;Electrical Stimulation;Iontophoresis 4mg /ml Dexamethasone;Moist Heat;Gait training;Functional mobility training;Therapeutic exercise;Balance training;Patient/family education;Manual techniques;Passive range of motion;Taping;Vasopneumatic Device    PT Next Visit Plan  review HEP, joint mobilization rt. ankle (all motions), strengthening of supporting ankle musculature, gastroc stretching    PT Home Exercise Plan  standing gastroc stretch; 4-way ankle with theraband; Ankle inversion/eversion AROM    Consulted and Agree with Plan of Care  Patient       Patient will benefit from skilled therapeutic intervention in order to improve the following deficits and impairments:     Visit Diagnosis: Pain in right ankle and joints of right foot  Stiffness of right ankle, not elsewhere classified  Muscle weakness (generalized)  Localized edema     Problem List Patient Active Problem List   Diagnosis Date Noted  . Closed right ankle fracture, with routine healing, subsequent encounter 11/26/2016  . Pre-operative cardiovascular examination 12/01/2015  . Gallstones 11/09/2015  . Diverticulitis of colon 08/24/2015  . Severe depression ([place]) 08/08/2014  . Dyspnea on effort 01/11/2013  . Chest discomfort 01/11/2013  . Marijuana abuse 04/17/2011  . UNSPECIFIED VITAMIN D DEFICIENCY 02/08/2010  . UNSPECIFIED ANEMIA 02/08/2010  . CLOSED  FRACTURE OF SHAFT OF FEMUR 02/08/2010   . ALCOHOL ABUSE, IN REMISSION, HX OF 09/30/2009  . UNSPECIFIED CONJUNCTIVITIS 09/13/2009  . WEAKNESS 10/10/2008  . HEARTBURN 03/25/2008  . GERD 03/24/2008  . OTHER DYSPHAGIA 03/24/2008  . MUSCLE PAIN 01/07/2008  . FOOT PAIN, LEFT 01/07/2008  . TINEA CORPORIS 01/04/2008  . Essential hypertension 01/04/2008  . LOW BACK PAIN, ACUTE 12/13/2006  . CERVICAL STRAIN 12/13/2006  . POSTMENOPAUSAL STATUS 12/09/2006  . Hyperlipidemia 12/08/2006  . Bipolar 1 disorder ([place]) 12/08/2006  . COPD 12/08/2006  . OSTEOARTHRITIS 12/08/2006  . Personal history of other diseases of digestive system 12/08/2006  . TONSILLECTOMY AND ADENOIDECTOMY, HX OF 12/08/2006    Toni [name]  PTA 12/30/2016, 6:32 PM  [facility] [address] [place], [place], [address] Phone: [phone]   Fax:  [phone]  Name: Toni [name] MRN: [id number] Date of Birth: [birth year]

## 2017-01-01 ENCOUNTER — Ambulatory Visit: Payer: Medicare Other | Admitting: Physical Therapy

## 2017-01-01 ENCOUNTER — Encounter: Payer: Self-pay | Admitting: Physical Therapy

## 2017-01-01 DIAGNOSIS — M6281 Muscle weakness (generalized): Secondary | ICD-10-CM | POA: Diagnosis not present

## 2017-01-01 DIAGNOSIS — R600 Localized edema: Secondary | ICD-10-CM

## 2017-01-01 DIAGNOSIS — M25571 Pain in right ankle and joints of right foot: Secondary | ICD-10-CM

## 2017-01-01 DIAGNOSIS — M25671 Stiffness of right ankle, not elsewhere classified: Secondary | ICD-10-CM | POA: Diagnosis not present

## 2017-01-01 NOTE — Therapy (Addendum)
[place], [place], [address] Phone: [phone]   Fax:  [phone]  Physical Therapy Treatment / Discharge Summary  Patient Details  Name: Srinika Delone MRN: [id number] Date of Birth: 09-Sep-1945 Referring Provider: Ann Held, DO   Encounter Date: 01/01/2017  PT End of Session - 01/01/17 1340    Visit Number  4    Number of Visits  12    Date for PT Re-Evaluation  01/10/17    Authorization Type  MCR KX by 15th visit, progress note by 10th visit    PT Start Time  1300    PT Stop Time  1342    PT Time Calculation (min)  42 min       Past Medical History:  Diagnosis Date  . Bipolar disorder ([place])    pt denies  . COPD (chronic obstructive pulmonary disease) ([place])   . Depression   . Diverticulitis   . Diverticulitis   . Fibromyalgia   . Gallstones 11/09/2015  . GERD (gastroesophageal reflux disease)   . Headache   . Hyperlipidemia   . Hypertension   . Osteoarthritis     Past Surgical History:  Procedure Laterality Date  . ANKLE ARTHROSCOPY WITH OPEN REDUCTION INTERNAL FIXATION (ORIF) Right 06/20/2016   second surgery 06/28/2016 in [place]  . CHOLECYSTECTOMY N/A 12/04/2015   Procedure: LAPAROSCOPIC CHOLECYSTECTOMY WITH INTRAOPERATIVE CHOLANGIOGRAM;  Surgeon: Fanny Skates, MD;  Location: [place];  Service: General;  Laterality: N/A;  . COLON RESECTION  2001   sigmoid colon removed  . COLOSTOMY CLOSURE  2011  . FEMUR FRACTURE SURGERY  09/2009  . JOINT REPLACEMENT Right 09/2009   R hip,  redone, 06/14/2010 [place],---  done secondary to fractured fractured x2  . TONSILLECTOMY AND ADENOIDECTOMY    . TOTAL HIP ARTHROPLASTY Left    1979 and 1986    There were no vitals filed for this visit.  Subjective Assessment - 01/01/17 1304    Subjective  Not going to well. I had crisis yesterday. I suffer from depression.     Currently in Pain?  Yes    Pain Score  3     Pain Location  Ankle     Pain Orientation  Right    Pain Descriptors / Indicators  Aching    Pain Type  Chronic pain    Aggravating Factors   cold weather     Pain Relieving Factors  rest          [facility] PT Assessment - 01/01/17 0001      Figure 8 Edema   Figure 8 - Right   Rt. ankle; 49.5 cm  visable swelling       AROM   Right Ankle Dorsiflexion  -3 prom neutral                  [facility] Adult PT Treatment/Exercise - 01/01/17 0001      Knee/Hip Exercises: Aerobic   Nustep  L4 LE x 5 minutes       Ankle Exercises: Seated   Other Seated Ankle Exercises  AROM Supination/pronation   10 x ach,  mildly painful 3 way band exercises with green band    Other Seated Ankle Exercises  Toe YOGA,  2 positions,  difficult 13 marble pick up, medial/ lateral foot.      Ankle Exercises: Stretches   Gastroc Stretch  3 reps;30 seconds      Ankle Exercises: Standing   Heel  Raises  10 reps    Toe Raise  10 reps    Other Standing Ankle Exercises  SLS x 30 seconds best                PT Short Term Goals - 01/01/17 1350      PT SHORT TERM GOAL #1   Title  Pt. will be independent and adherent with I HEP.    Baseline  intermittent     Time  3    Period  Weeks    Status  On-going      PT SHORT TERM GOAL #2   Title  Pt. will demonstrate decreased ankle swelling >/= 5 cm to facilitate improved mobility.    Status  Unable to assess        PT Long Term Goals - 01/01/17 1350      PT LONG TERM GOAL #1   Title  Pt. will increase ankle DF >/= 10 degrees to move [facility] to promote functional gait pattern.     Baseline  improved 7 degrees    Time  6    Period  Weeks    Status  On-going      PT LONG TERM GOAL #2   Title  Pt. will increase overall ankle strength to >/= 4+/5 to display adequate strength to support LE during gait.    Baseline  MMT 4=/5, heel raises not tested     Time  6    Period  Weeks    Status  Partially Met      PT LONG TERM GOAL #3   Title  Pt. will be independent with all  prescribed HEP as of last visit.    Time  6    Status  On-going      PT LONG TERM GOAL #4   Title  pt be able to walk for >/= 30 min with LRAD with </=2/10 pain to promote functional mobility and community amb    Baseline  2-3/10 pain    Time  6    Period  Weeks    Status  On-going            Plan - 01/01/17 1347    Clinical Impression Statement  Pt has improved AROM DF to -3. Her MMT ankle strength has improved to 4+/5. SLS 30 seconds best on right. Making good progress toward goals, Pain with 30 minutes in store 2-3/10 at most.  Some pain after closed chain exercises today.     PT Next Visit Plan  review HEP, joint mobilization rt. ankle (all motions), strengthening of supporting ankle musculature, gastroc stretching    PT Home Exercise Plan  standing gastroc stretch; 4-way ankle with theraband; Ankle inversion/eversion AROM    Consulted and Agree with Plan of Care  Patient       Patient will benefit from skilled therapeutic intervention in order to improve the following deficits and impairments:  Abnormal gait, Hypomobility, Increased edema, Decreased activity tolerance, Decreased strength, Pain, Decreased mobility, Decreased range of motion  Visit Diagnosis: Pain in right ankle and joints of right foot  Stiffness of right ankle, not elsewhere classified  Muscle weakness (generalized)  Localized edema     Problem List Patient Active Problem List   Diagnosis Date Noted  . Closed right ankle fracture, with routine healing, subsequent encounter 11/26/2016  . Pre-operative cardiovascular examination 12/01/2015  . Gallstones 11/09/2015  . Diverticulitis of colon 08/24/2015  . Severe depression ([place]) 08/08/2014  .  Dyspnea on effort 01/11/2013  . Chest discomfort 01/11/2013  . Marijuana abuse 04/17/2011  . UNSPECIFIED VITAMIN D DEFICIENCY 02/08/2010  . UNSPECIFIED ANEMIA 02/08/2010  . CLOSED FRACTURE OF SHAFT OF FEMUR 02/08/2010  . ALCOHOL ABUSE, IN REMISSION, HX OF  09/30/2009  . UNSPECIFIED CONJUNCTIVITIS 09/13/2009  . WEAKNESS 10/10/2008  . HEARTBURN 03/25/2008  . GERD 03/24/2008  . OTHER DYSPHAGIA 03/24/2008  . MUSCLE PAIN 01/07/2008  . FOOT PAIN, LEFT 01/07/2008  . TINEA CORPORIS 01/04/2008  . Essential hypertension 01/04/2008  . LOW BACK PAIN, ACUTE 12/13/2006  . CERVICAL STRAIN 12/13/2006  . POSTMENOPAUSAL STATUS 12/09/2006  . Hyperlipidemia 12/08/2006  . Bipolar 1 disorder ([place]) 12/08/2006  . COPD 12/08/2006  . OSTEOARTHRITIS 12/08/2006  . Personal history of other diseases of digestive system 12/08/2006  . TONSILLECTOMY AND ADENOIDECTOMY, HX OF 12/08/2006    Dorene Ar, PTA 01/01/2017, 1:53 PM [place], [place], [address] Phone: [phone]   Fax:  [phone]  Name: Emer Onnen MRN: [id number] Date of Birth: 18-May-1945     PHYSICAL THERAPY DISCHARGE SUMMARY  Visits from Start of Care: 4  Current functional level related to goals / functional outcomes: See goals   Remaining deficits: unknown   Education / Equipment: HEP  Plan: Patient agrees to discharge.  Patient goals were not met. Patient is being discharged due to not returning since the last visit.  ?????         Kristoffer Leamon PT, DPT, LAT, ATC  06/10/17  12:49 PM

## 2017-01-03 DIAGNOSIS — F419 Anxiety disorder, unspecified: Secondary | ICD-10-CM | POA: Diagnosis not present

## 2017-01-03 DIAGNOSIS — F331 Major depressive disorder, recurrent, moderate: Secondary | ICD-10-CM | POA: Diagnosis not present

## 2017-01-06 ENCOUNTER — Encounter: Payer: Medicare Other | Admitting: Physical Therapy

## 2017-01-08 ENCOUNTER — Ambulatory Visit: Payer: Medicare Other | Admitting: Physical Therapy

## 2017-01-14 ENCOUNTER — Other Ambulatory Visit: Payer: Self-pay | Admitting: Family Medicine

## 2017-01-15 ENCOUNTER — Other Ambulatory Visit: Payer: Self-pay | Admitting: Family Medicine

## 2017-01-29 ENCOUNTER — Ambulatory Visit: Payer: Medicare Other | Admitting: Physical Therapy

## 2017-01-29 ENCOUNTER — Telehealth: Payer: Self-pay | Admitting: Physical Therapy

## 2017-01-29 NOTE — Telephone Encounter (Signed)
LVM regarding today's missed appointment at 11:00 today, and that it was her last scheduled visit. If she feels she needs more PT to call and get back on the schedule or we can discharge her from PT; but to call either way.   Anahi Belmar PT, DPT, LAT, ATC  01/29/17  1:15 PM

## 2017-01-31 ENCOUNTER — Encounter: Payer: Self-pay | Admitting: Family Medicine

## 2017-02-06 DIAGNOSIS — F331 Major depressive disorder, recurrent, moderate: Secondary | ICD-10-CM | POA: Diagnosis not present

## 2017-02-14 ENCOUNTER — Other Ambulatory Visit: Payer: Self-pay | Admitting: Family Medicine

## 2017-02-14 DIAGNOSIS — M62838 Other muscle spasm: Secondary | ICD-10-CM

## 2017-02-19 ENCOUNTER — Telehealth: Payer: Self-pay | Admitting: Physical Therapy

## 2017-02-19 DIAGNOSIS — F419 Anxiety disorder, unspecified: Secondary | ICD-10-CM | POA: Diagnosis not present

## 2017-02-19 DIAGNOSIS — F331 Major depressive disorder, recurrent, moderate: Secondary | ICD-10-CM | POA: Diagnosis not present

## 2017-02-19 NOTE — Telephone Encounter (Signed)
Returned pt call which she discussed be interested in returning to PT. Discussed importance of doing exercises at home in order to promote improvement in pain and strength in the ankle. Pt stated she is willing to try again. Reviewed attendance policy and scheduled pt for a re-assessment on 02/27/2017.   Liani Caris PT, DPT, LAT, ATC  02/19/17  8:56 AM

## 2017-02-27 ENCOUNTER — Ambulatory Visit: Payer: Medicare Other | Admitting: Physical Therapy

## 2017-03-06 DIAGNOSIS — F331 Major depressive disorder, recurrent, moderate: Secondary | ICD-10-CM | POA: Diagnosis not present

## 2017-03-17 DIAGNOSIS — F331 Major depressive disorder, recurrent, moderate: Secondary | ICD-10-CM | POA: Diagnosis not present

## 2017-03-17 DIAGNOSIS — F419 Anxiety disorder, unspecified: Secondary | ICD-10-CM | POA: Diagnosis not present

## 2017-03-20 ENCOUNTER — Other Ambulatory Visit: Payer: Self-pay | Admitting: Family Medicine

## 2017-03-20 DIAGNOSIS — M62838 Other muscle spasm: Secondary | ICD-10-CM

## 2017-03-25 NOTE — Telephone Encounter (Signed)
Pt is calling to check on the status of her tiZANidine (ZANAFLEX) 4 MG tablet [Pharmacy Med Name: TIZANIDINE 4MG  TABLETS] [[id number]].    Please advise.

## 2017-03-26 ENCOUNTER — Other Ambulatory Visit: Payer: Self-pay | Admitting: Family Medicine

## 2017-03-26 MED ORDER — TIZANIDINE HCL 4 MG PO TABS: 4.0000 mg | ORAL_TABLET | Freq: Four times a day (QID) | ORAL | 0 refills | Status: DC | PRN

## 2017-03-26 NOTE — Telephone Encounter (Signed)
Refilled x1 

## 2017-03-26 NOTE — Telephone Encounter (Signed)
Looks like patient said she completed course of tizanidine in October.  Do you want to refill?

## 2017-03-27 DIAGNOSIS — F419 Anxiety disorder, unspecified: Secondary | ICD-10-CM | POA: Diagnosis not present

## 2017-03-27 DIAGNOSIS — F331 Major depressive disorder, recurrent, moderate: Secondary | ICD-10-CM | POA: Diagnosis not present

## 2017-04-04 ENCOUNTER — Other Ambulatory Visit: Payer: Self-pay | Admitting: Family Medicine

## 2017-04-04 NOTE — Telephone Encounter (Signed)
[facility] requesting refill for alprazolam  Database ran and is on your desk for review.  Last filled per database: 12/13/16 Last written: 11/26/16 Last ov: 11/26/16 Next ov: none Contract: none UDS: none  Patient is on temazepam from another provider.

## 2017-04-10 DIAGNOSIS — F331 Major depressive disorder, recurrent, moderate: Secondary | ICD-10-CM | POA: Diagnosis not present

## 2017-04-11 ENCOUNTER — Ambulatory Visit (INDEPENDENT_AMBULATORY_CARE_PROVIDER_SITE_OTHER): Payer: Medicare Other | Admitting: Family Medicine

## 2017-04-11 ENCOUNTER — Encounter: Payer: Self-pay | Admitting: Family Medicine

## 2017-04-11 VITALS — BP 122/68 | HR 88 | Temp 98.0°F | Resp 16 | Ht 66.14 in | Wt 154.2 lb

## 2017-04-11 DIAGNOSIS — M62838 Other muscle spasm: Secondary | ICD-10-CM | POA: Diagnosis not present

## 2017-04-11 DIAGNOSIS — J40 Bronchitis, not specified as acute or chronic: Secondary | ICD-10-CM

## 2017-04-11 DIAGNOSIS — J0140 Acute pansinusitis, unspecified: Secondary | ICD-10-CM | POA: Insufficient documentation

## 2017-04-11 DIAGNOSIS — R05 Cough: Secondary | ICD-10-CM | POA: Diagnosis not present

## 2017-04-11 DIAGNOSIS — R059 Cough, unspecified: Secondary | ICD-10-CM

## 2017-04-11 MED ORDER — LEVOCETIRIZINE DIHYDROCHLORIDE 5 MG PO TABS: 5.0000 mg | ORAL_TABLET | Freq: Every evening | ORAL | 5 refills | Status: DC

## 2017-04-11 MED ORDER — PROMETHAZINE-DM 6.25-15 MG/5ML PO SYRP: 5.0000 mL | ORAL_SOLUTION | Freq: Four times a day (QID) | ORAL | 0 refills | Status: DC | PRN

## 2017-04-11 MED ORDER — TIZANIDINE HCL 4 MG PO TABS
4.0000 mg | ORAL_TABLET | Freq: Four times a day (QID) | ORAL | 0 refills | Status: DC | PRN
Start: 2017-04-11 — End: 2017-08-07

## 2017-04-11 MED ORDER — GUAIFENESIN ER 600 MG PO TB12: 1200.0000 mg | ORAL_TABLET | Freq: Two times a day (BID) | ORAL | 3 refills | Status: DC | PRN

## 2017-04-11 MED ORDER — CEFADROXIL 500 MG PO CAPS: 500.0000 mg | ORAL_CAPSULE | Freq: Two times a day (BID) | ORAL | 0 refills | Status: DC

## 2017-04-11 MED ORDER — CEFUROXIME AXETIL 500 MG PO TABS: 500.0000 mg | ORAL_TABLET | Freq: Two times a day (BID) | ORAL | 0 refills | Status: DC

## 2017-04-11 MED ORDER — CEFDINIR 300 MG PO CAPS: 300.0000 mg | ORAL_CAPSULE | Freq: Two times a day (BID) | ORAL | 0 refills | Status: DC

## 2017-04-11 MED FILL — LEVOCETIRIZINE 5 MG TABLET: 5 | 30 days supply | Qty: 30 | Fill #0

## 2017-04-11 MED FILL — PROMETHAZINE-DM SYRUP: 6.25-15 | 4 days supply | Qty: 118 | Fill #0

## 2017-04-11 MED FILL — CEFADROXIL 500 MG CAPSULE: 500 | 10 days supply | Qty: 20 | Fill #0

## 2017-04-11 NOTE — Assessment & Plan Note (Signed)
abx per orders xyzal  flonase

## 2017-04-11 NOTE — Patient Instructions (Signed)

## 2017-04-11 NOTE — Progress Notes (Signed)
Subjective:  I acted as a Education administrator for [employer]. Yancey Flemings, [place]   Patient ID: Toni [name], female    DOB: 1945-12-06, 72 y.o.   MRN: [id number]  Chief Complaint  Patient presents with  . Jaw Pain    right    HPI  Patient is in today for right jaw pain with clicking sound.  Pt went to the dentist yesterday.   Pt also c/o sinus congestion and cough x 2 months.  She is using otc with no relief.  No fever,  She feels like it is going into her lungs     Patient Care Team: Carollee Herter, Alferd Apa, DO as PCP - General Norma Fredrickson, MD as Consulting Physician (Psychiatry) Norma Fredrickson, MD as Consulting Physician (Psychiatry) Ralene Bathe, MD as Consulting Physician (Ophthalmology) Milus Banister, MD as Consulting Physician (Gastroenterology)   Past Medical History:  Diagnosis Date  . Bipolar disorder ([place])    pt denies  . COPD (chronic obstructive pulmonary disease) ([place])   . Depression   . Diverticulitis   . Diverticulitis   . Fibromyalgia   . Gallstones 11/09/2015  . GERD (gastroesophageal reflux disease)   . Headache   . Hyperlipidemia   . Hypertension   . Osteoarthritis     Past Surgical History:  Procedure Laterality Date  . ANKLE ARTHROSCOPY WITH OPEN REDUCTION INTERNAL FIXATION (ORIF) Right 06/20/2016   second surgery 06/28/2016 in [place]  . CHOLECYSTECTOMY N/A 12/04/2015   Procedure: LAPAROSCOPIC CHOLECYSTECTOMY WITH INTRAOPERATIVE CHOLANGIOGRAM;  Surgeon: Fanny Skates, MD;  Location: [facility];  Service: General;  Laterality: N/A;  . COLON RESECTION  2001   sigmoid colon removed  . COLOSTOMY CLOSURE  2011  . FEMUR FRACTURE SURGERY  09/2009  . JOINT REPLACEMENT Right 09/2009   R hip,  redone, 06/14/2010 [place],---  done secondary to fractured fractured x2  . TONSILLECTOMY AND ADENOIDECTOMY    . TOTAL HIP ARTHROPLASTY Left    1979 and 1986    Family History  Problem Relation Age of Onset  . Stroke Mother   . Arthritis Unknown        mother  family  . Depression Unknown        mother fanily  . Alcohol abuse Unknown        Both sides  . Cancer Unknown        mothers side  . Stroke Father   . Achalasia Father   . Colon cancer Neg Hx     Social History   Socioeconomic History  . Marital status: Single    Spouse name: Not on file  . Number of children: 1  . Years of education: Not on file  . Highest education level: Not on file  Social Needs  . Financial resource strain: Not on file  . Food insecurity - worry: Not on file  . Food insecurity - inability: Not on file  . Transportation needs - medical: Not on file  . Transportation needs - non-medical: Not on file  Occupational History  . Occupation: retired  Tobacco Use  . Smoking status: Current Every Day Smoker    Last attempt to quit: 06/18/2011    Years since quitting: 5.8  . Smokeless tobacco: Never Used  . Tobacco comment: marijuana,  daily for years   Substance and Sexual Activity  . Alcohol use: Yes    Comment: 2-3 bottles of wine/week  . Drug use: Yes    Frequency: 14.0 times per week    Types:  Marijuana    Comment: - daily use  . Sexual activity: Not Currently    Partners: Male  Other Topics Concern  . Not on file  Social History Narrative   Lives alone in a one story home.  Retired from different jobs.  Education: college.  Has one child.     Outpatient Medications Prior to Visit  Medication Sig Dispense Refill  . ALPRAZolam (XANAX) 1 MG tablet TAKE 1/2 TABLET BY MOUTH THREE TIMES DAILY AS NEEDED--- dr Casimiro Needle to take over 30 tablet 0  . Ascorbic Acid (VITAMIN C PO) Take by mouth.    . B-COMPLEX CAPS Take 1 capsule by mouth daily.    . Biotin 1000 MCG tablet Take 5,000 mcg by mouth daily.     . Calcium Citrate (CITRACAL PO) Take 1 tablet by mouth daily.    . cholecalciferol (VITAMIN D) 1000 UNITS tablet Take 1,000 Units by mouth daily.     . cyanocobalamin 1000 MCG tablet Take 2,000 mcg by mouth daily.     . Flaxseed, Linseed, (FLAX SEED OIL  PO) Take 1 capsule by mouth daily.    Marland Kitchen gabapentin (NEURONTIN) 600 MG tablet Take 1 tablet (600 mg total) by mouth 3 (three) times daily. 90 tablet 0  . gabapentin (NEURONTIN) 600 MG tablet Take 1 tablet (600 mg total) by mouth 3 (three) times daily. 90 tablet 5  . ibuprofen (ADVIL,MOTRIN) 200 MG tablet Take 1 tablet (200 mg total) by mouth every 6 (six) hours as needed for moderate pain. 30 tablet 0  . lamoTRIgine (LAMICTAL) 100 MG tablet Take 1 tablet (100 mg total) by mouth 2 (two) times daily. 60 tablet 0  . lamoTRIgine (LAMICTAL) 100 MG tablet Take 1 tablet (100 mg total) by mouth 2 (two) times daily. 60 tablet 5  . levocetirizine (XYZAL) 5 MG tablet Take 1 tablet (5 mg total) by mouth every evening. 30 tablet 5  . losartan-hydrochlorothiazide (HYZAAR) 100-25 MG tablet Take 1 tablet by mouth daily. 90 tablet 1  . Magnesium Citrate 100 MG TABS Take 1 tablet by mouth daily.    . Melatonin 10 MG CAPS Take 1 capsule by mouth daily.    . Multiple Vitamin (MULTIVITAMIN) tablet Take 1 tablet by mouth daily.    . Omega-3 Fatty Acids (FISH OIL) 500 MG CAPS Take by mouth.    . Selenium (SELENIMIN PO) Take 1 tablet by mouth daily.    . temazepam (RESTORIL) 15 MG capsule Take 30 mg by mouth at bedtime.   1  . Valerian 450 MG CAPS Take 1 capsule by mouth daily.    Marland Kitchen venlafaxine XR (EFFEXOR-XR) 75 MG 24 hr capsule Take 75 mg by mouth daily.    . vitamin E 400 UNIT capsule Take 400 Units by mouth daily.    Marland Kitchen guaiFENesin (MUCINEX) 600 MG 12 hr tablet Take 2 tablets (1,200 mg total) by mouth 2 (two) times daily as needed. 60 tablet 3  . tiZANidine (ZANAFLEX) 4 MG tablet Take 1-2 tablets (4-8 mg total) by mouth every 6 (six) hours as needed. 30 tablet 0   Facility-Administered Medications Prior to Visit  Medication Dose Route Frequency Provider Last Rate Last Dose  . 0.9 %  sodium chloride infusion  500 mL Intravenous Continuous Milus Banister, MD        Allergies  Allergen Reactions  . Levofloxacin  Rash    REACTION: RASH/REDNESS    Review of Systems  Constitutional: Negative for chills, fever and malaise/fatigue.  HENT: Negative for congestion and hearing loss.   Eyes: Negative for discharge.  Respiratory: Negative for cough, sputum production and shortness of breath.   Cardiovascular: Negative for chest pain, palpitations and leg swelling.  Gastrointestinal: Negative for abdominal pain, blood in stool, constipation, diarrhea, heartburn, nausea and vomiting.  Genitourinary: Negative for dysuria, frequency, hematuria and urgency.  Musculoskeletal: Negative for back pain, falls and myalgias.  Skin: Negative for rash.  Neurological: Negative for dizziness, sensory change, loss of consciousness, weakness and headaches.  Endo/Heme/Allergies: Negative for environmental allergies. Does not bruise/bleed easily.  Psychiatric/Behavioral: Positive for depression. Negative for suicidal ideas. The patient is nervous/anxious. The patient does not have insomnia.        Objective:    Physical Exam  Constitutional: She is oriented to person, place, and time. She appears well-developed and well-nourished.  HENT:  Right Ear: External ear normal.  Left Ear: External ear normal.  Nose: Right sinus exhibits maxillary sinus tenderness and frontal sinus tenderness. Left sinus exhibits maxillary sinus tenderness and frontal sinus tenderness.  Mouth/Throat: Posterior oropharyngeal erythema present. No oropharyngeal exudate or posterior oropharyngeal edema.  + PND + errythema  Eyes: Conjunctivae are normal. Right eye exhibits no discharge. Left eye exhibits no discharge.  Cardiovascular: Normal rate, regular rhythm and normal heart sounds.  No murmur heard. Pulmonary/Chest: Effort normal. No respiratory distress. She has wheezes. She has no rales. She exhibits no tenderness.  Musculoskeletal: She exhibits no edema.  Lymphadenopathy:    She has cervical adenopathy.  Neurological: She is alert and  oriented to person, place, and time.  Nursing note and vitals reviewed.   BP 122/68 (BP Location: Left Arm, Patient Position: Sitting, Cuff Size: Normal)   Pulse 88   Temp 98 F (36.7 C) (Oral)   Resp 16   Ht 5' 6.14" (1.68 m)   Wt 154 lb 3.2 oz (69.9 kg)   SpO2 97%   BMI 24.78 kg/m  Wt Readings from Last 3 Encounters:  04/11/17 154 lb 3.2 oz (69.9 kg)  11/26/16 151 lb (68.5 kg)  06/06/16 130 lb (59 kg)   BP Readings from Last 3 Encounters:  04/11/17 122/68  11/26/16 (!) 150/90  06/06/16 110/72     Immunization History  Administered Date(s) Administered  . Influenza Whole 11/09/2008, 11/29/2010  . Influenza-Unspecified 10/29/2015  . Pneumococcal Conjugate-13 08/08/2014  . Pneumococcal Polysaccharide-23 05/11/2012    Health Maintenance  Topic Date Due  . INFLUENZA VACCINE  11/26/2017 (Originally 08/28/2016)  . TETANUS/TDAP  04/12/2018 (Originally 05/18/1965)  . MAMMOGRAM  05/18/2018  . COLONOSCOPY  10/05/2020  . DEXA SCAN  Completed  . Hepatitis C Screening  Completed  . PNA vac Low Risk Adult  Completed    Lab Results  Component Value Date   WBC 7.1 05/03/2016   HGB 13.6 05/03/2016   HCT 39.3 05/03/2016   PLT 278 05/03/2016   GLUCOSE 87 11/26/2016   CHOL 239 (H) 11/26/2016   TRIG 106.0 11/26/2016   HDL 75.50 11/26/2016   LDLDIRECT 162.3 05/11/2012   LDLCALC 142 (H) 11/26/2016   ALT 10 11/26/2016   AST 15 11/26/2016   NA 134 (L) 11/26/2016   K 4.2 11/26/2016   CL 95 (L) 11/26/2016   CREATININE 0.77 11/26/2016   BUN 22 11/26/2016   CO2 31 11/26/2016   TSH 1.32 05/03/2016   INR 1.05 09/30/2009   HGBA1C 5.7 04/14/2007    Lab Results  Component Value Date   TSH 1.32 05/03/2016   Lab Results  Component Value Date   WBC 7.1 05/03/2016   HGB 13.6 05/03/2016   HCT 39.3 05/03/2016   MCV 93.8 05/03/2016   PLT 278 05/03/2016   Lab Results  Component Value Date   NA 134 (L) 11/26/2016   K 4.2 11/26/2016   CO2 31 11/26/2016   GLUCOSE 87 11/26/2016     BUN 22 11/26/2016   CREATININE 0.77 11/26/2016   BILITOT 0.6 11/26/2016   ALKPHOS 58 11/26/2016   AST 15 11/26/2016   ALT 10 11/26/2016   PROT 7.4 11/26/2016   ALBUMIN 4.7 11/26/2016   CALCIUM 10.5 11/26/2016   ANIONGAP 10 11/29/2015   GFR 78.65 11/26/2016   Lab Results  Component Value Date   CHOL 239 (H) 11/26/2016   Lab Results  Component Value Date   HDL 75.50 11/26/2016   Lab Results  Component Value Date   LDLCALC 142 (H) 11/26/2016   Lab Results  Component Value Date   TRIG 106.0 11/26/2016   Lab Results  Component Value Date   CHOLHDL 3 11/26/2016   Lab Results  Component Value Date   HGBA1C 5.7 04/14/2007         Assessment & Plan:   Problem List Items Addressed This Visit      Unprioritized   Acute pansinusitis    abx per orders xyzal  flonase       Relevant Medications   guaiFENesin (MUCINEX) 600 MG 12 hr tablet   promethazine-dextromethorphan (PROMETHAZINE-DM) 6.25-15 MG/5ML syrup   levocetirizine (XYZAL) 5 MG tablet   cefadroxil (DURICEF) 500 MG capsule    Other Visit Diagnoses    Muscle spasm    -  Primary   Relevant Medications   tiZANidine (ZANAFLEX) 4 MG tablet   Cough       Relevant Medications   guaiFENesin (MUCINEX) 600 MG 12 hr tablet   promethazine-dextromethorphan (PROMETHAZINE-DM) 6.25-15 MG/5ML syrup   levocetirizine (XYZAL) 5 MG tablet   Bronchitis       Relevant Medications   promethazine-dextromethorphan (PROMETHAZINE-DM) 6.25-15 MG/5ML syrup   levocetirizine (XYZAL) 5 MG tablet      I have discontinued Toni [name]'s cefdinir and cefUROXime. I am also having her start on promethazine-dextromethorphan, levocetirizine, and cefadroxil. Additionally, I am having her maintain her multivitamin, cholecalciferol, cyanocobalamin, Biotin, B-Complex, Ascorbic Acid (VITAMIN C PO), temazepam, (Flaxseed, Linseed, (FLAX SEED OIL PO)), Valerian, Melatonin, Magnesium Citrate, Calcium Citrate (CITRACAL PO), Selenium (SELENIMIN  PO), vitamin E, ibuprofen, venlafaxine XR, ALPRAZolam, gabapentin, levocetirizine, lamoTRIgine, lamoTRIgine, gabapentin, Fish Oil, losartan-hydrochlorothiazide, guaiFENesin, and tiZANidine. We will continue to administer sodium chloride.  Meds ordered this encounter  Medications  . guaiFENesin (MUCINEX) 600 MG 12 hr tablet    Sig: Take 2 tablets (1,200 mg total) by mouth 2 (two) times daily as needed.    Dispense:  60 tablet    Refill:  3  . tiZANidine (ZANAFLEX) 4 MG tablet    Sig: Take 1-2 tablets (4-8 mg total) by mouth every 6 (six) hours as needed.    Dispense:  30 tablet    Refill:  0  . DISCONTD: cefdinir (OMNICEF) 300 MG capsule    Sig: Take 1 capsule (300 mg total) by mouth 2 (two) times daily.    Dispense:  20 capsule    Refill:  0  . promethazine-dextromethorphan (PROMETHAZINE-DM) 6.25-15 MG/5ML syrup    Sig: Take 5 mLs by mouth 4 (four) times daily as needed.    Dispense:  118 mL    Refill:  0  .  levocetirizine (XYZAL) 5 MG tablet    Sig: Take 1 tablet (5 mg total) by mouth every evening.    Dispense:  30 tablet    Refill:  5  . DISCONTD: cefUROXime (CEFTIN) 500 MG tablet    Sig: Take 1 tablet (500 mg total) by mouth 2 (two) times daily with a meal. For 10 days    Dispense:  20 tablet    Refill:  0  . cefadroxil (DURICEF) 500 MG capsule    Sig: Take 1 capsule (500 mg total) by mouth 2 (two) times daily.    Dispense:  20 capsule    Refill:  0    CMA served as scribe during this visit. History, Physical and Plan performed by medical provider. Documentation and orders reviewed and attested to.  Ann Held, DO

## 2017-05-29 ENCOUNTER — Telehealth: Payer: Self-pay | Admitting: Family Medicine

## 2017-05-29 NOTE — Telephone Encounter (Signed)
Copied from [place] [id number]. Topic: Medicare AWV >> May 29, 2017 10:58 AM Rutherford Nail, NT wrote: Reason for CRM: Patient calling and states she has received calls about scheduling AWV. Called to schedule this appointment. Please advise. CB#: [phone]

## 2017-06-06 ENCOUNTER — Ambulatory Visit: Payer: Self-pay

## 2017-06-06 ENCOUNTER — Encounter (HOSPITAL_COMMUNITY): Payer: Self-pay | Admitting: Emergency Medicine

## 2017-06-06 ENCOUNTER — Emergency Department (HOSPITAL_COMMUNITY)
Admission: EM | Admit: 2017-06-06 | Discharge: 2017-06-07 | Disposition: A | Discharge status: Home / Self Care | Payer: Medicare Other | Attending: Emergency Medicine | Admitting: Emergency Medicine

## 2017-06-06 DIAGNOSIS — R42 Dizziness and giddiness: Secondary | ICD-10-CM | POA: Diagnosis not present

## 2017-06-06 DIAGNOSIS — E876 Hypokalemia: Secondary | ICD-10-CM | POA: Diagnosis not present

## 2017-06-06 DIAGNOSIS — I10 Essential (primary) hypertension: Secondary | ICD-10-CM | POA: Diagnosis not present

## 2017-06-06 DIAGNOSIS — E860 Dehydration: Secondary | ICD-10-CM | POA: Diagnosis not present

## 2017-06-06 DIAGNOSIS — Z79899 Other long term (current) drug therapy: Secondary | ICD-10-CM | POA: Insufficient documentation

## 2017-06-06 DIAGNOSIS — J449 Chronic obstructive pulmonary disease, unspecified: Secondary | ICD-10-CM | POA: Diagnosis not present

## 2017-06-06 DIAGNOSIS — F17210 Nicotine dependence, cigarettes, uncomplicated: Secondary | ICD-10-CM | POA: Insufficient documentation

## 2017-06-06 DIAGNOSIS — R531 Weakness: Secondary | ICD-10-CM | POA: Diagnosis not present

## 2017-06-06 DIAGNOSIS — F1210 Cannabis abuse, uncomplicated: Secondary | ICD-10-CM | POA: Insufficient documentation

## 2017-06-06 DIAGNOSIS — E871 Hypo-osmolality and hyponatremia: Secondary | ICD-10-CM | POA: Insufficient documentation

## 2017-06-06 DIAGNOSIS — R110 Nausea: Secondary | ICD-10-CM | POA: Diagnosis not present

## 2017-06-06 LAB — CBC
HEMATOCRIT: 34.5 % — AB (ref 36.0–46.0)
Hemoglobin: 12.6 g/dL (ref 12.0–15.0)
MCH: 32.6 pg (ref 26.0–34.0)
MCHC: 36.5 g/dL — AB (ref 30.0–36.0)
MCV: 89.1 fL (ref 78.0–100.0)
Platelets: 301 10*3/uL (ref 150–400)
RBC: 3.87 MIL/uL (ref 3.87–5.11)
RDW: 12.8 % (ref 11.5–15.5)
WBC: 8.9 10*3/uL (ref 4.0–10.5)

## 2017-06-06 LAB — COMPREHENSIVE METABOLIC PANEL
ALBUMIN: 4.7 g/dL (ref 3.5–5.0)
ALK PHOS: 78 U/L (ref 38–126)
ALT: 14 U/L (ref 14–54)
ANION GAP: 15 (ref 5–15)
AST: 22 U/L (ref 15–41)
BUN: 23 mg/dL — ABNORMAL HIGH (ref 6–20)
CHLORIDE: 86 mmol/L — AB (ref 101–111)
CO2: 23 mmol/L (ref 22–32)
Calcium: 10.1 mg/dL (ref 8.9–10.3)
Creatinine, Ser: 1.18 mg/dL — ABNORMAL HIGH (ref 0.44–1.00)
GFR calc non Af Amer: 45 mL/min — ABNORMAL LOW (ref >60)
GFR, EST AFRICAN AMERICAN: 52 mL/min — AB (ref >60)
GLUCOSE: 117 mg/dL — AB (ref 65–99)
POTASSIUM: 3.2 mmol/L — AB (ref 3.5–5.1)
SODIUM: 124 mmol/L — AB (ref 135–145)
Total Bilirubin: 0.6 mg/dL (ref 0.3–1.2)
Total Protein: 7.7 g/dL (ref 6.5–8.1)

## 2017-06-06 LAB — LIPASE, BLOOD: LIPASE: 29 U/L (ref 11–51)

## 2017-06-06 MED ORDER — ONDANSETRON 4 MG PO TBDP
4.0000 mg | ORAL_TABLET | Freq: Once | ORAL | Status: AC | PRN
  Administered 2017-06-06: 4 mg via ORAL
  Filled 2017-06-06: qty 1

## 2017-06-06 MED ORDER — SODIUM CHLORIDE 0.9 % IV BOLUS
2000.0000 mL | Freq: Once | INTRAVENOUS | Status: AC
  Administered 2017-06-06: 2000 mL via INTRAVENOUS

## 2017-06-06 NOTE — ED Triage Notes (Signed)
Pt reports today woke up feeling nauseous. Pt reports didn't eat. When leaving [place] in car felt liek going to vomit so had to open door of car and dry heaved. When got home ate something, then after a while took her BP with cuff monitor at home. It was 72/48 then repositioned arm and tried again and was still low. Called her PCP who advised to go to Ed.  Repots feeling weak and lightheaded.

## 2017-06-06 NOTE — ED Provider Notes (Signed)
[facility] DEPT Provider Note   CSN: [id number] Arrival date & time: 06/06/17  1801     History   Chief Complaint Chief Complaint  Patient presents with  . Dizziness  . Nausea    HPI Toni [name] is a 72 y.o. female.  Patient presents for evaluation of generalized weakness, nausea, minimal vomiting. Symptoms started earlier today. She denies pain, fever, cough, congestion, SOB. She reports chronic diarrhea that is nonbloody and unchanged. When she returned home from running errands, she felt her condition was worsening and she took her blood pressure with her home cuff, finding it low with reported systolic pressure in the 69'G. She denies near syncope or syncope. No falls. She states she has severe depression and has been very inactive for the last several months with poor diet, eating once daily. No suicidal thoughts, hallucinations or HI. No intentional self injury.   The history is provided by the patient. No language interpreter was used.    Past Medical History:  Diagnosis Date  . Bipolar disorder ([place])    pt denies  . COPD (chronic obstructive pulmonary disease) ([place])   . Depression   . Diverticulitis   . Diverticulitis   . Fibromyalgia   . Gallstones 11/09/2015  . GERD (gastroesophageal reflux disease)   . Headache   . Hyperlipidemia   . Hypertension   . Osteoarthritis     Patient Active Problem List   Diagnosis Date Noted  . Acute pansinusitis 04/11/2017  . Closed right ankle fracture, with routine healing, subsequent encounter 11/26/2016  . Pre-operative cardiovascular examination 12/01/2015  . Gallstones 11/09/2015  . Diverticulitis of colon 08/24/2015  . Severe depression ([place]) 08/08/2014  . Dyspnea on effort 01/11/2013  . Chest discomfort 01/11/2013  . Marijuana abuse 04/17/2011  . UNSPECIFIED VITAMIN D DEFICIENCY 02/08/2010  . UNSPECIFIED ANEMIA 02/08/2010  . CLOSED FRACTURE OF SHAFT OF FEMUR 02/08/2010  . ALCOHOL  ABUSE, IN REMISSION, HX OF 09/30/2009  . UNSPECIFIED CONJUNCTIVITIS 09/13/2009  . WEAKNESS 10/10/2008  . HEARTBURN 03/25/2008  . GERD 03/24/2008  . OTHER DYSPHAGIA 03/24/2008  . MUSCLE PAIN 01/07/2008  . FOOT PAIN, LEFT 01/07/2008  . TINEA CORPORIS 01/04/2008  . Essential hypertension 01/04/2008  . LOW BACK PAIN, ACUTE 12/13/2006  . CERVICAL STRAIN 12/13/2006  . POSTMENOPAUSAL STATUS 12/09/2006  . Hyperlipidemia 12/08/2006  . Bipolar 1 disorder ([address]) 12/08/2006  . COPD 12/08/2006  . OSTEOARTHRITIS 12/08/2006  . Personal history of other diseases of digestive system 12/08/2006  . TONSILLECTOMY AND ADENOIDECTOMY, HX OF 12/08/2006    Past Surgical History:  Procedure Laterality Date  . ANKLE ARTHROSCOPY WITH OPEN REDUCTION INTERNAL FIXATION (ORIF) Right 06/20/2016   second surgery 06/28/2016 in [place]  . CHOLECYSTECTOMY N/A 12/04/2015   Procedure: LAPAROSCOPIC CHOLECYSTECTOMY WITH INTRAOPERATIVE CHOLANGIOGRAM;  Surgeon: Fanny Skates, MD;  Location: [facility];  Service: General;  Laterality: N/A;  . COLON RESECTION  2001   sigmoid colon removed  . COLOSTOMY CLOSURE  2011  . FEMUR FRACTURE SURGERY  09/2009  . JOINT REPLACEMENT Right 09/2009   R hip,  redone, 06/14/2010 [place],---  done secondary to fractured fractured x2  . TONSILLECTOMY AND ADENOIDECTOMY    . TOTAL HIP ARTHROPLASTY Left    1979 and 1986     OB History   None      Home Medications    Prior to Admission medications   Medication Sig Start Date End Date Taking? Authorizing Provider  ALPRAZolam Duanne Moron) 1 MG tablet TAKE 1/2 TABLET  BY MOUTH THREE TIMES DAILY AS NEEDED--- dr Casimiro Needle to take over Patient taking differently: Take 1 mg by mouth 3 (three) times daily as needed for anxiety. dr Casimiro Needle to take over 11/26/16  Yes Carollee Herter, Alferd Apa, DO  Biotin 1000 MCG tablet Take 5,000 mcg by mouth daily.    Yes [provider]  Calcium Citrate (CITRACAL PO) Take 1 tablet by mouth daily.   Yes [provider]  cholecalciferol (VITAMIN D) 1000 UNITS tablet Take 1,000 Units by mouth daily.    Yes [provider]  gabapentin (NEURONTIN) 600 MG tablet Take 1 tablet (600 mg total) by mouth 3 (three) times daily. 11/26/16  Yes Ann Held, DO  lamoTRIgine (LAMICTAL) 100 MG tablet Take 1 tablet (100 mg total) by mouth 2 (two) times daily. 11/26/16  Yes Roma Schanz R, DO  losartan-hydrochlorothiazide (HYZAAR) 100-25 MG tablet Take 1 tablet by mouth daily. 01/17/17  Yes Roma Schanz R, DO  Magnesium Citrate 100 MG TABS Take 1 tablet by mouth every evening.    Yes [provider]  temazepam (RESTORIL) 15 MG capsule Take 30 mg by mouth at bedtime.  11/30/13  Yes [provider]  Valerian 450 MG CAPS Take 1 capsule by mouth every evening.    Yes [provider]  venlafaxine XR (EFFEXOR-XR) 75 MG 24 hr capsule Take 75 mg by mouth daily. 03/13/16  Yes [provider]  cefadroxil (DURICEF) 500 MG capsule Take 1 capsule (500 mg total) by mouth 2 (two) times daily. Patient not taking: Reported on 06/06/2017 04/11/17   Carollee Herter, Alferd Apa, DO  gabapentin (NEURONTIN) 600 MG tablet Take 1 tablet (600 mg total) by mouth 3 (three) times daily. Patient not taking: Reported on 06/06/2017 11/26/16   Carollee Herter, Alferd Apa, DO  guaiFENesin (MUCINEX) 600 MG 12 hr tablet Take 2 tablets (1,200 mg total) by mouth 2 (two) times daily as needed. Patient not taking: Reported on 06/06/2017 04/11/17   Carollee Herter, Alferd Apa, DO  ibuprofen (ADVIL,MOTRIN) 200 MG tablet Take 1 tablet (200 mg total) by mouth every 6 (six) hours as needed for moderate pain. Patient not taking: Reported on 06/06/2017 01/19/16   Carollee Herter, Alferd Apa, DO  lamoTRIgine (LAMICTAL) 100 MG tablet Take 1 tablet (100 mg total) by mouth 2 (two) times daily. Patient not taking: Reported on 06/06/2017 11/26/16   Carollee Herter, Alferd Apa, DO  levocetirizine (XYZAL) 5 MG tablet Take 1 tablet (5 mg  total) by mouth every evening. Patient not taking: Reported on 06/06/2017 11/26/16   Carollee Herter, Alferd Apa, DO  levocetirizine (XYZAL) 5 MG tablet Take 1 tablet (5 mg total) by mouth every evening. Patient not taking: Reported on 06/06/2017 04/11/17   Ann Held, DO  promethazine-dextromethorphan (PROMETHAZINE-DM) 6.25-15 MG/5ML syrup Take 5 mLs by mouth 4 (four) times daily as needed. Patient not taking: Reported on 06/06/2017 04/11/17   Carollee Herter, Alferd Apa, DO  tiZANidine (ZANAFLEX) 4 MG tablet Take 1-2 tablets (4-8 mg total) by mouth every 6 (six) hours as needed. Patient taking differently: Take 4-8 mg by mouth every 6 (six) hours as needed for muscle spasms.  04/11/17   Ann Held, DO    Family History Family History  Problem Relation Age of Onset  . Stroke Mother   . Arthritis Unknown        mother family  . Depression Unknown        mother fanily  .  Alcohol abuse Unknown        Both sides  . Cancer Unknown        mothers side  . Stroke Father   . Achalasia Father   . Colon cancer Neg Hx     Social History Social History   Tobacco Use  . Smoking status: Current Every Day Smoker    Last attempt to quit: 06/18/2011    Years since quitting: 5.9  . Smokeless tobacco: Never Used  . Tobacco comment: marijuana,  daily for years   Substance Use Topics  . Alcohol use: Yes    Comment: 2-3 bottles of wine/week  . Drug use: Yes    Frequency: 14.0 times per week    Types: Marijuana    Comment: - daily use     Allergies   Levofloxacin   Review of Systems Review of Systems  Constitutional: Positive for activity change and fatigue. Negative for chills, diaphoresis and fever.  HENT: Negative.   Respiratory: Negative.  Negative for cough and shortness of breath.   Cardiovascular: Negative.  Negative for chest pain.  Gastrointestinal: Positive for nausea. Negative for abdominal pain.  Genitourinary: Negative.   Musculoskeletal: Negative.   Skin:  Negative.   Neurological: Positive for weakness. Negative for syncope, light-headedness and headaches.     Physical Exam Updated Vital Signs BP (!) 148/86 (BP Location: Left Arm)   Pulse 89   Temp 98.4 F (36.9 C) (Oral)   Resp 16   SpO2 98%   Physical Exam  Constitutional: She is oriented to person, place, and time. She appears well-developed and well-nourished.  HENT:  Head: Normocephalic.  Neck: Normal range of motion. Neck supple.  Cardiovascular: Normal rate and regular rhythm.  No murmur heard. Pulmonary/Chest: Effort normal and breath sounds normal. She has no wheezes.  Abdominal: Soft. Bowel sounds are normal. There is no tenderness. There is no rebound and no guarding.  Musculoskeletal: Normal range of motion. She exhibits no edema.  Neurological: She is alert and oriented to person, place, and time.  Skin: Skin is warm and dry. No rash noted.  Psychiatric: She has a normal mood and affect.     ED Treatments / Results  Labs (all labs ordered are listed, but only abnormal results are displayed) Labs Reviewed  COMPREHENSIVE METABOLIC PANEL - Abnormal; Notable for the following components:      Result Value   Sodium 124 (*)    Potassium 3.2 (*)    Chloride 86 (*)    Glucose, Bld 117 (*)    BUN 23 (*)    Creatinine, Ser 1.18 (*)    GFR calc non Af Amer 45 (*)    GFR calc Af Amer 52 (*)    All other components within normal limits  CBC - Abnormal; Notable for the following components:   HCT 34.5 (*)    MCHC 36.5 (*)    All other components within normal limits  LIPASE, BLOOD  URINALYSIS, ROUTINE W REFLEX MICROSCOPIC    EKG None  Radiology No results found.  Procedures Procedures (including critical care time)  Medications Ordered in ED Medications  ondansetron (ZOFRAN-ODT) disintegrating tablet 4 mg (4 mg Oral Given 06/06/17 1832)     Initial Impression / Assessment and Plan / ED Course  I have reviewed the triage vital signs and the nursing  notes.  Pertinent labs & imaging results that were available during my care of the patient were reviewed by me and considered in my medical  decision making (see chart for details).     Patient is here for evaluation of generalized weakness and nausea that started earlier today.  She is found to have a significantly low sodium, chloride, potassium. She reports hypotension at home. EKG unremarkable.   The patient is not wanting to be admitted. Discussed the plan will be to hydrate and recheck labs. She is drinking fluids in the room after Zofran. VSS. Discussed with Dr. Zenia Resides who has seen the patient and agrees with care plan.   IVF's provided. Recheck of electrolytes shows improvement. Per plan discussed with Dr. Zenia Resides, the patient can be discharged home and will need to follow up with her doctor next week for recheck of sodium and potassium. Dietary supplementation discussed with the patient.   Final Clinical Impressions(s) / ED Diagnoses   Final diagnoses:  None   1. Dehydration 2. Electrolyte abnormalities  ED Discharge Orders    None       Charlann Lange, Hershal Coria 06/07/17 0424    Lacretia Leigh, MD 06/07/17 [phone]

## 2017-06-06 NOTE — ED Notes (Addendum)
Pt reported using the restroom before coming back to room 8. Unable to provide a urine specimen at this time. She reports nausea has lessened, and is eating popcorn.

## 2017-06-06 NOTE — ED Triage Notes (Signed)
Pt states that doesn't take care of herself due to her mental health and here lately been in a "frozen state". Denies si or HI.

## 2017-06-06 NOTE — Telephone Encounter (Signed)
Patient called in with c/o "low BP." She says "I was a little nauseated after brushing my teeth, but didn't think anything of it. I was out doing errands and I didn't feel all that great, but I kept on going. I got in my car and the feeling came over me to vomit and I got out the car and nothing came up. I came on home and was feeling weak, shaky, still nauseated. I checked my BP with the wrist meter I have and when I put my wrist across my chest, my BP was 78/42. So I hung my arm down and it was 99/44. I don't eat very well, I don't have an appetite, but I stay hydrated as much as I can." I asked her to check her BP, she says "across my chest 89/52, hanging down it went up to 113/62, so I guess that is better." I asked for the pulse reading, she says "97." According to protocol, go to ED. I advised the patient to have someone drive her to the ED to be evaluated due to the symptoms she's having with the low BP. I asked is she taking her BP medication everyday, she says "yes and I took it this morning. I don't check my blood pressure before I take them. I just follow the instructions on the bottle to take everyday." She says "I will call around to find someone to take me to the emergency room." Care advice given, patient verbalized understanding.   Reason for Disposition . [5] Fall in systolic BP > 20 mm Hg from normal AND [2] dizzy, lightheaded, or weak  Answer Assessment - Initial Assessment Questions 1. BLOOD PRESSURE: "What is the blood pressure?" "Did you take at least two measurements 5 minutes apart?"    89/52 across chest 2. ONSET: "When did you take your blood pressure?"     Now 3. HOW: "How did you obtain the blood pressure?" (e.g., visiting nurse, automatic home BP monitor)    Automatic home BP monitor (wrist monitor) 4. HISTORY: "Do you have a history of low blood pressure?" "What is your blood pressure normally?"     Yes, this is what I was told 5. MEDICATIONS: "Are you taking any  medications for blood pressure?" If yes: "Have they been changed recently?"     Yes. Take it every morning 6. PULSE RATE: "Do you know what your pulse rate is?"      97 7. OTHER SYMPTOMS: "Have you been sick recently?" "Have you had a recent injury?"     No 8. PREGNANCY: "Is there any chance you are pregnant?" "When was your last menstrual period?"     No  Protocols used: LOW BLOOD PRESSURE-A-AH

## 2017-06-07 ENCOUNTER — Other Ambulatory Visit: Payer: Self-pay

## 2017-06-07 LAB — COMPREHENSIVE METABOLIC PANEL
ALK PHOS: 68 U/L (ref 38–126)
ALT: 13 U/L — AB (ref 14–54)
AST: 17 U/L (ref 15–41)
Albumin: 3.8 g/dL (ref 3.5–5.0)
Anion gap: 12 (ref 5–15)
BILIRUBIN TOTAL: 0.7 mg/dL (ref 0.3–1.2)
BUN: 17 mg/dL (ref 6–20)
CALCIUM: 9.1 mg/dL (ref 8.9–10.3)
CO2: 22 mmol/L (ref 22–32)
CREATININE: 0.83 mg/dL (ref 0.44–1.00)
Chloride: 93 mmol/L — ABNORMAL LOW (ref 101–111)
GFR calc non Af Amer: >60 mL/min (ref >60)
GLUCOSE: 114 mg/dL — AB (ref 65–99)
Potassium: 3.1 mmol/L — ABNORMAL LOW (ref 3.5–5.1)
Sodium: 127 mmol/L — ABNORMAL LOW (ref 135–145)
TOTAL PROTEIN: 6.8 g/dL (ref 6.5–8.1)

## 2017-06-07 LAB — URINALYSIS, ROUTINE W REFLEX MICROSCOPIC
BILIRUBIN URINE: NEGATIVE
Glucose, UA: NEGATIVE mg/dL
HGB URINE DIPSTICK: NEGATIVE
Ketones, ur: NEGATIVE mg/dL
Leukocytes, UA: NEGATIVE
Nitrite: NEGATIVE
PH: 7 (ref 5.0–8.0)
Protein, ur: NEGATIVE mg/dL
SPECIFIC GRAVITY, URINE: 1.005 (ref 1.005–1.030)

## 2017-06-07 MED ORDER — POTASSIUM CHLORIDE CRYS ER 20 MEQ PO TBCR: 20.0000 meq | EXTENDED_RELEASE_TABLET | Freq: Two times a day (BID) | ORAL | 0 refills | Status: DC

## 2017-06-07 MED ORDER — ONDANSETRON 4 MG PO TBDP: 4.0000 mg | ORAL_TABLET | Freq: Three times a day (TID) | ORAL | 0 refills | Status: DC | PRN

## 2017-06-08 ENCOUNTER — Encounter: Payer: Self-pay | Admitting: Family Medicine

## 2017-06-12 ENCOUNTER — Encounter: Payer: Self-pay | Admitting: Family Medicine

## 2017-06-13 ENCOUNTER — Ambulatory Visit: Payer: Medicare Other | Admitting: Family Medicine

## 2017-06-17 ENCOUNTER — Ambulatory Visit: Payer: Medicare Other | Admitting: Family Medicine

## 2017-06-20 ENCOUNTER — Encounter: Payer: Self-pay | Admitting: Family Medicine

## 2017-06-20 ENCOUNTER — Ambulatory Visit: Payer: Medicare Other | Admitting: Family Medicine

## 2017-06-21 IMAGING — DX DG HIP (WITH OR WITHOUT PELVIS) 2V BILAT
3 series · 3 of 3 positions shown · non-contrast
Comparison: 09/30/2009.

CLINICAL DATA: Left hip pain. Knee pain. No known injury. Initial
evaluation.

EXAM:
DG HIP (WITH OR WITHOUT PELVIS) 2V BILAT

[pelvis ap]
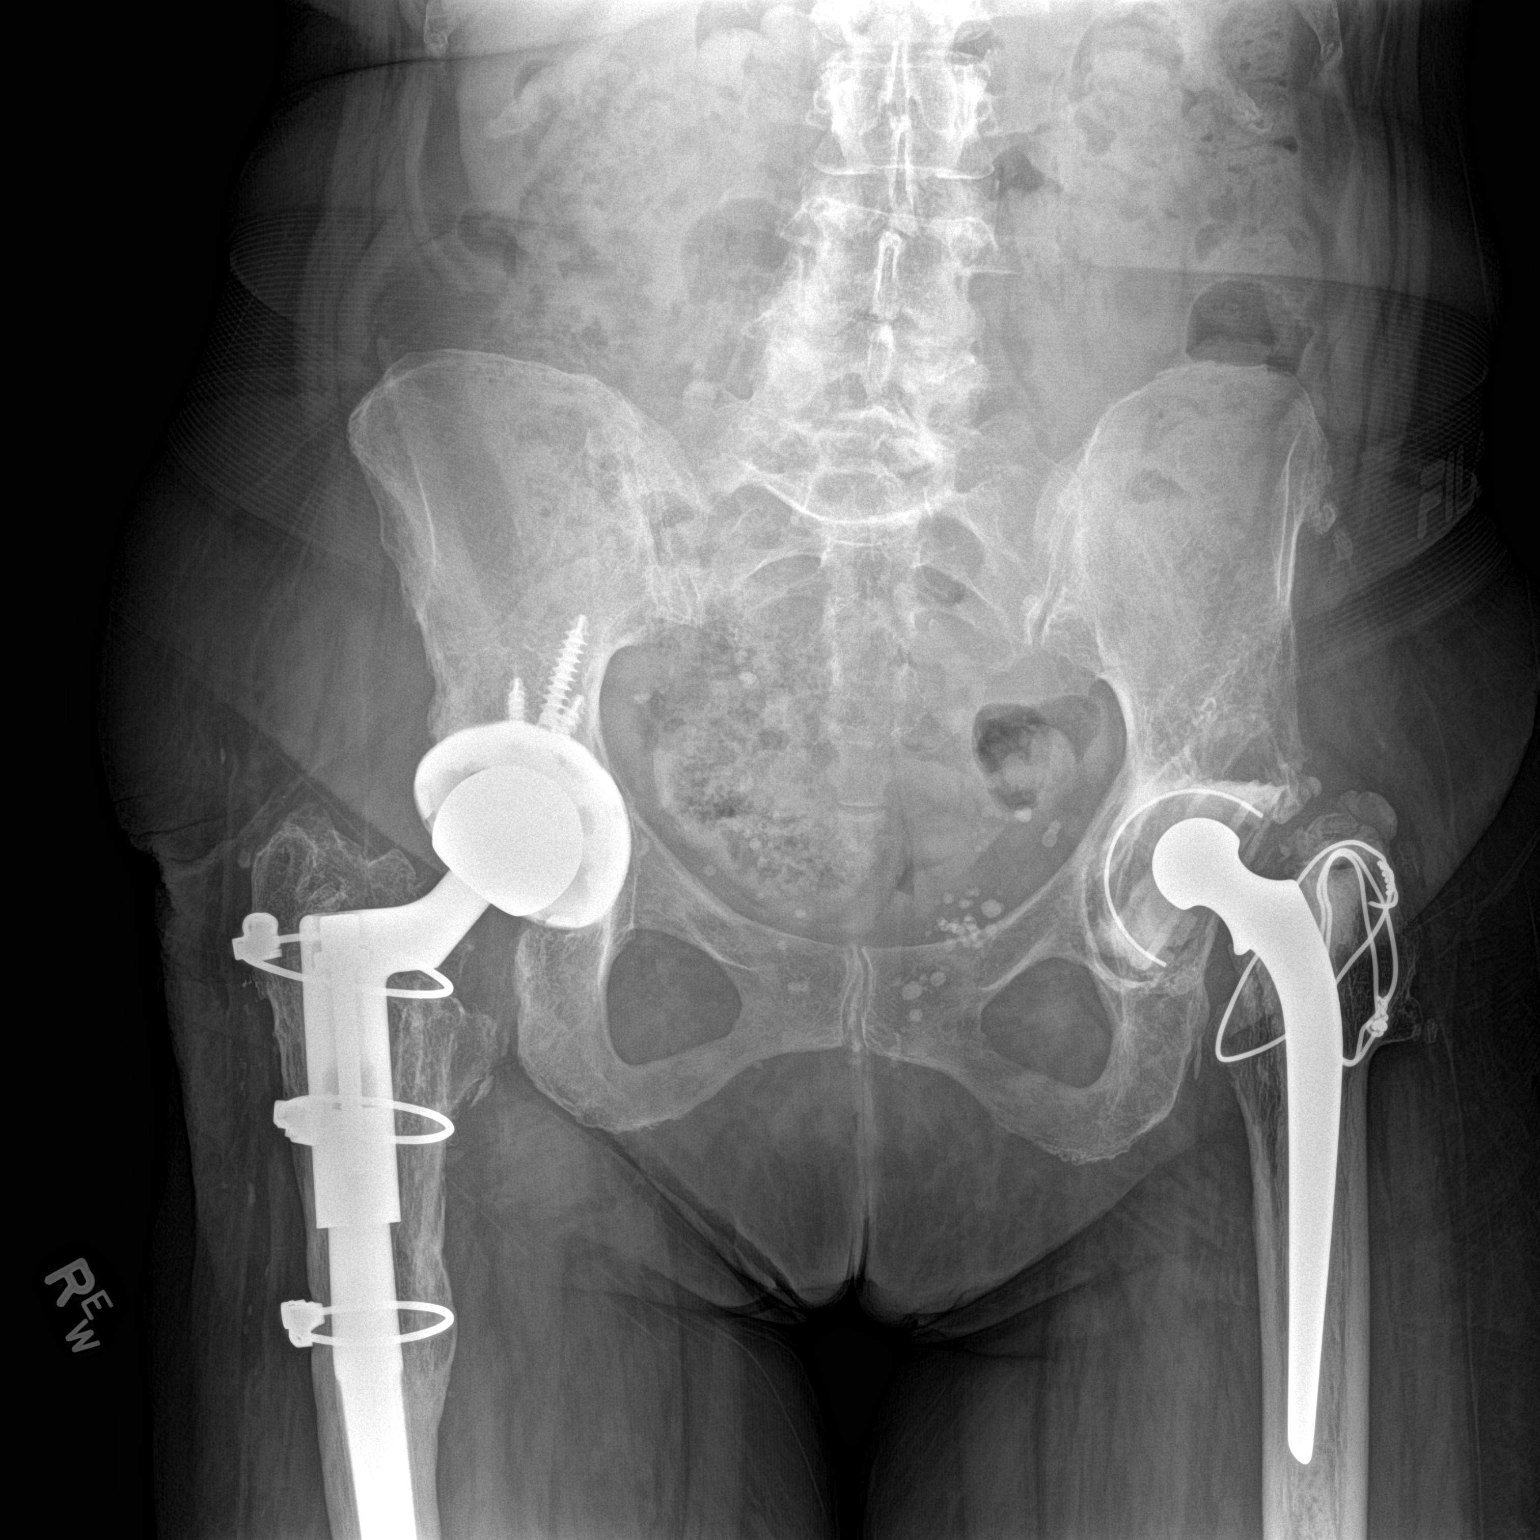

[hip ap]
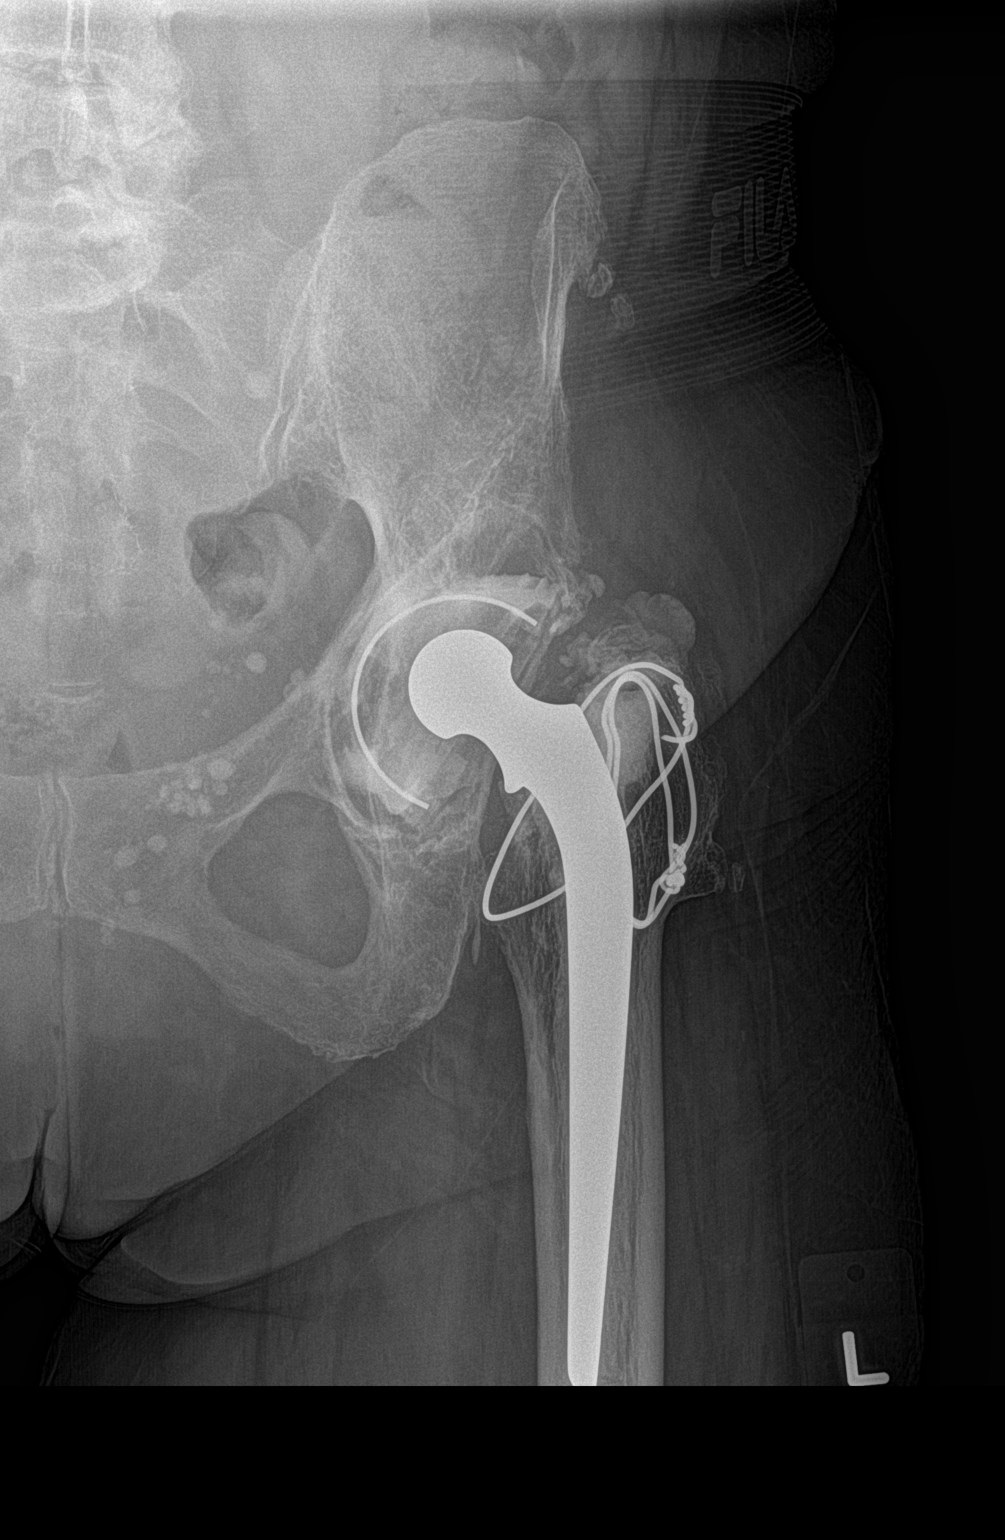

[hip lat]
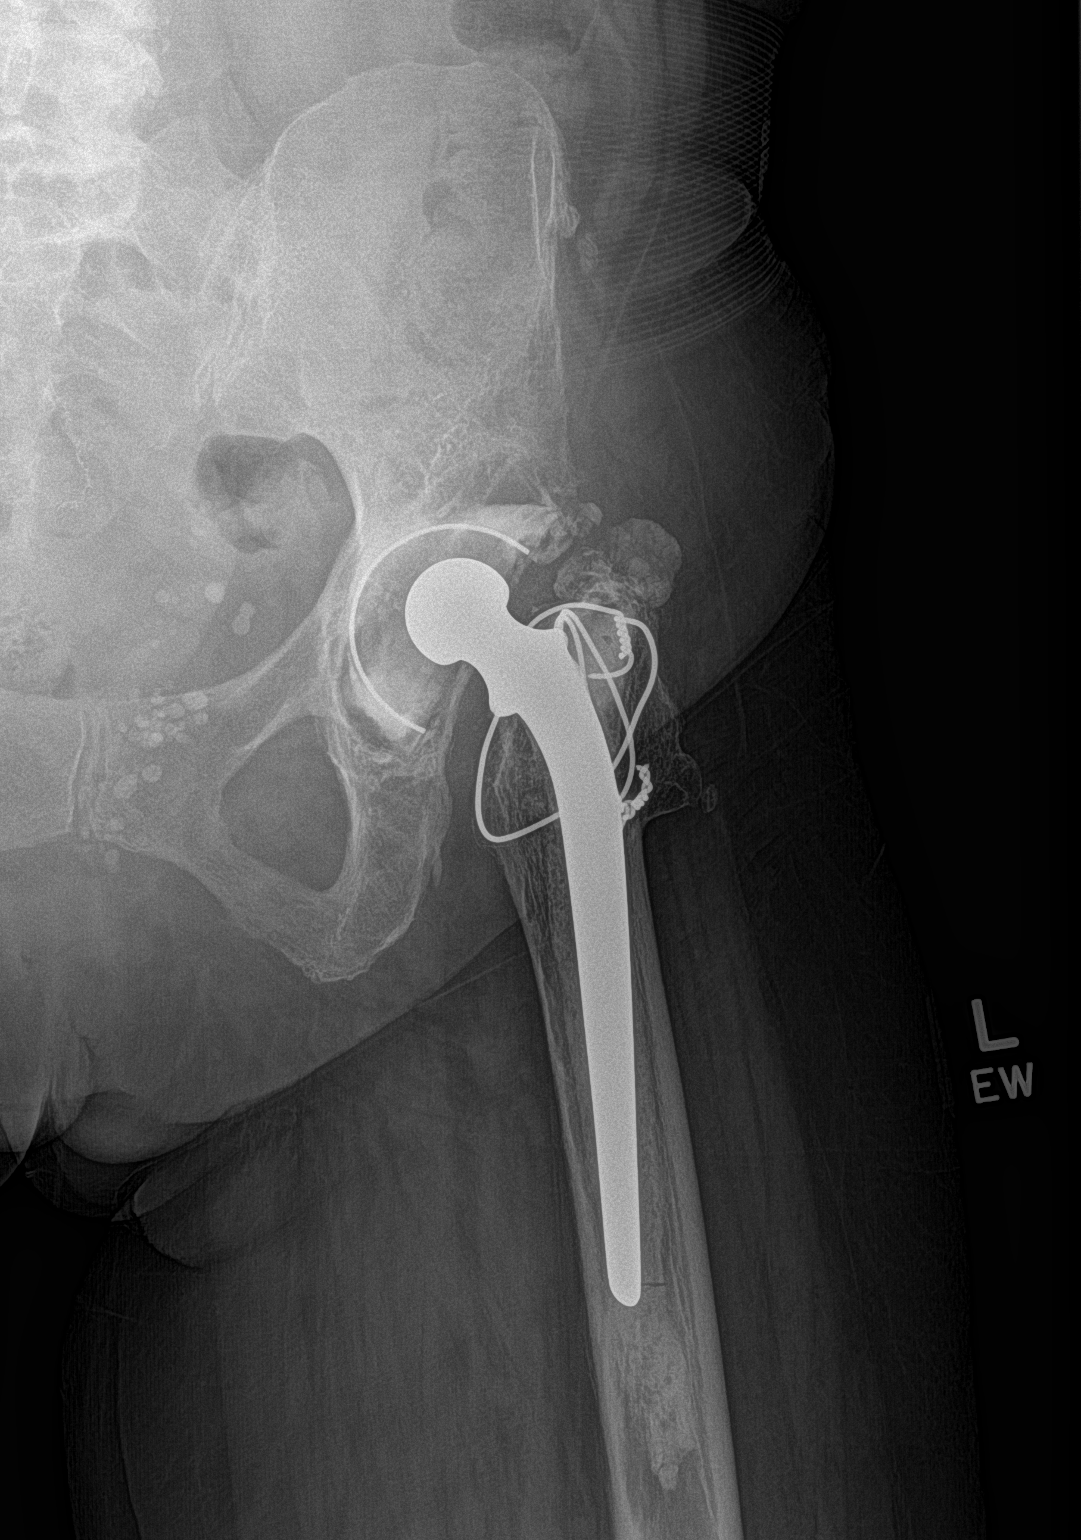

[3 of 3 positions shown; findings below may reference images not displayed]

FINDINGS: Diffuse osteopenia.Degenerative changes noted lumbar spine.
Bilateral hip replacements are unchanged from prior exam of
09/30/2009. Hardware intact. No acute abnormality. Pelvic
calcifications consistent phleboliths.
IMPRESSION: 1.  Bilateral hip replacements appears stable from 09/30/2009.

2.  No acute abnormality identified.

## 2017-06-21 IMAGING — DX DG KNEE 1-2V*L*
2 series · 2 of 2 positions shown · non-contrast
Comparison: None.

CLINICAL DATA: Chronic left hip pain and left knee pain

EXAM:
LEFT KNEE - 1-2 VIEW

[knee ap]
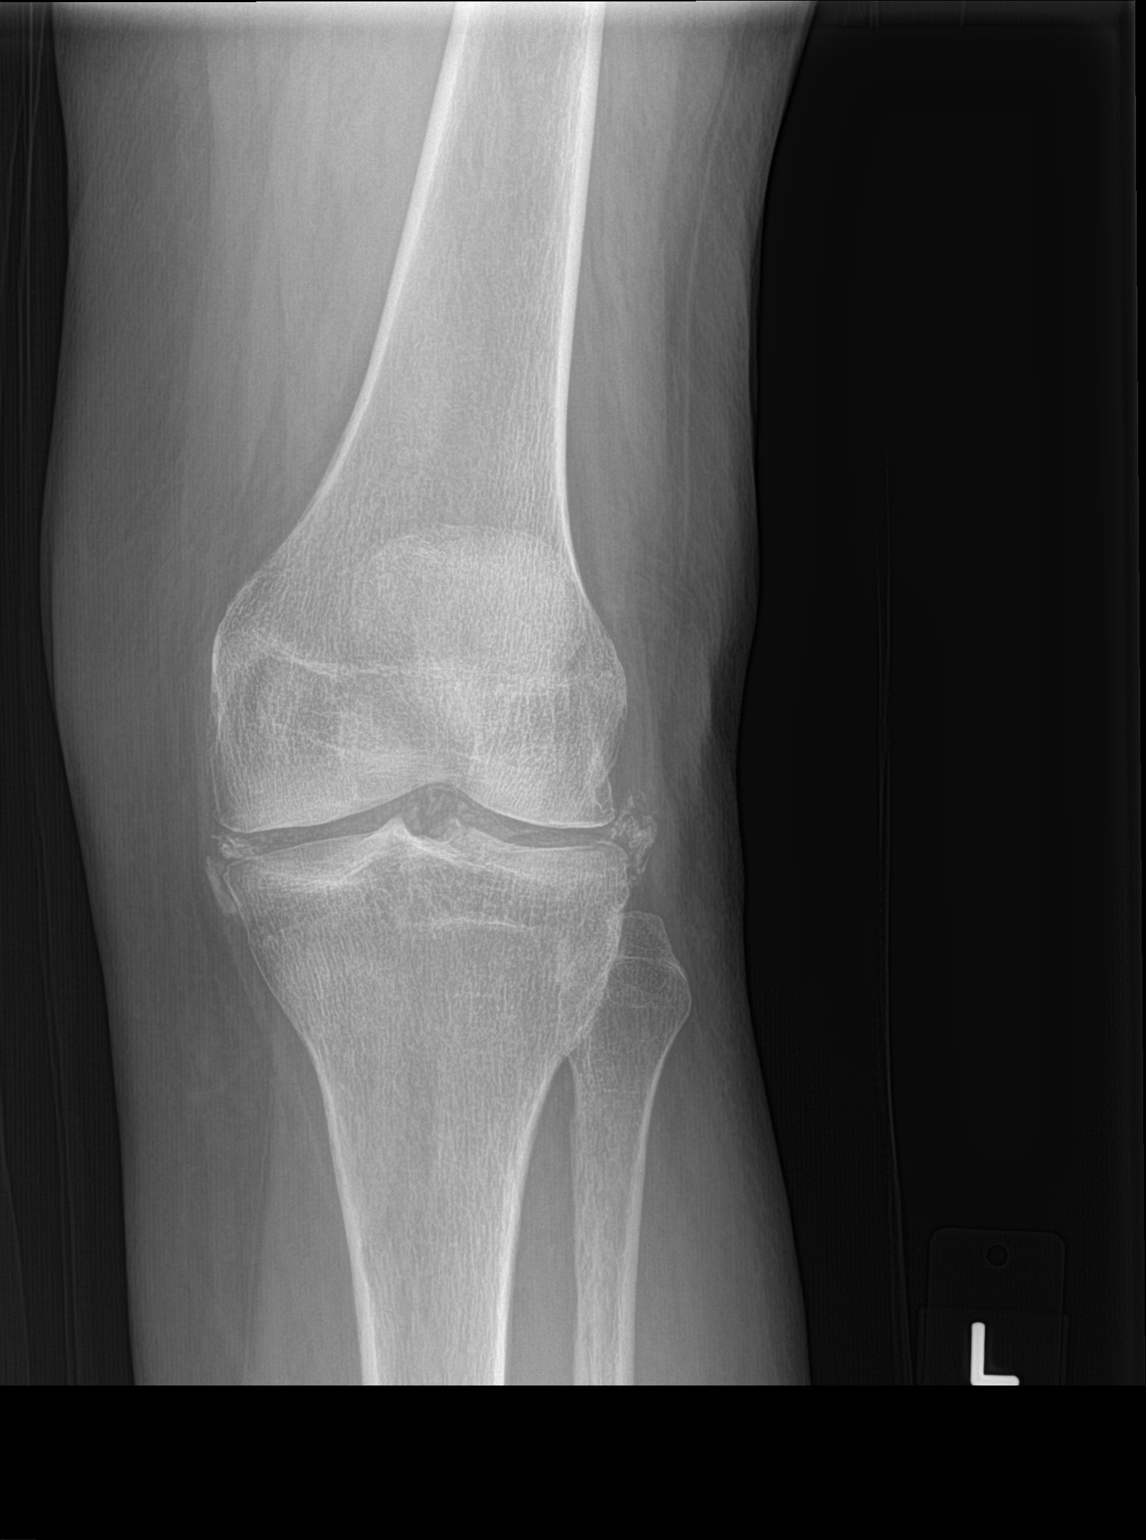

[knee lat]
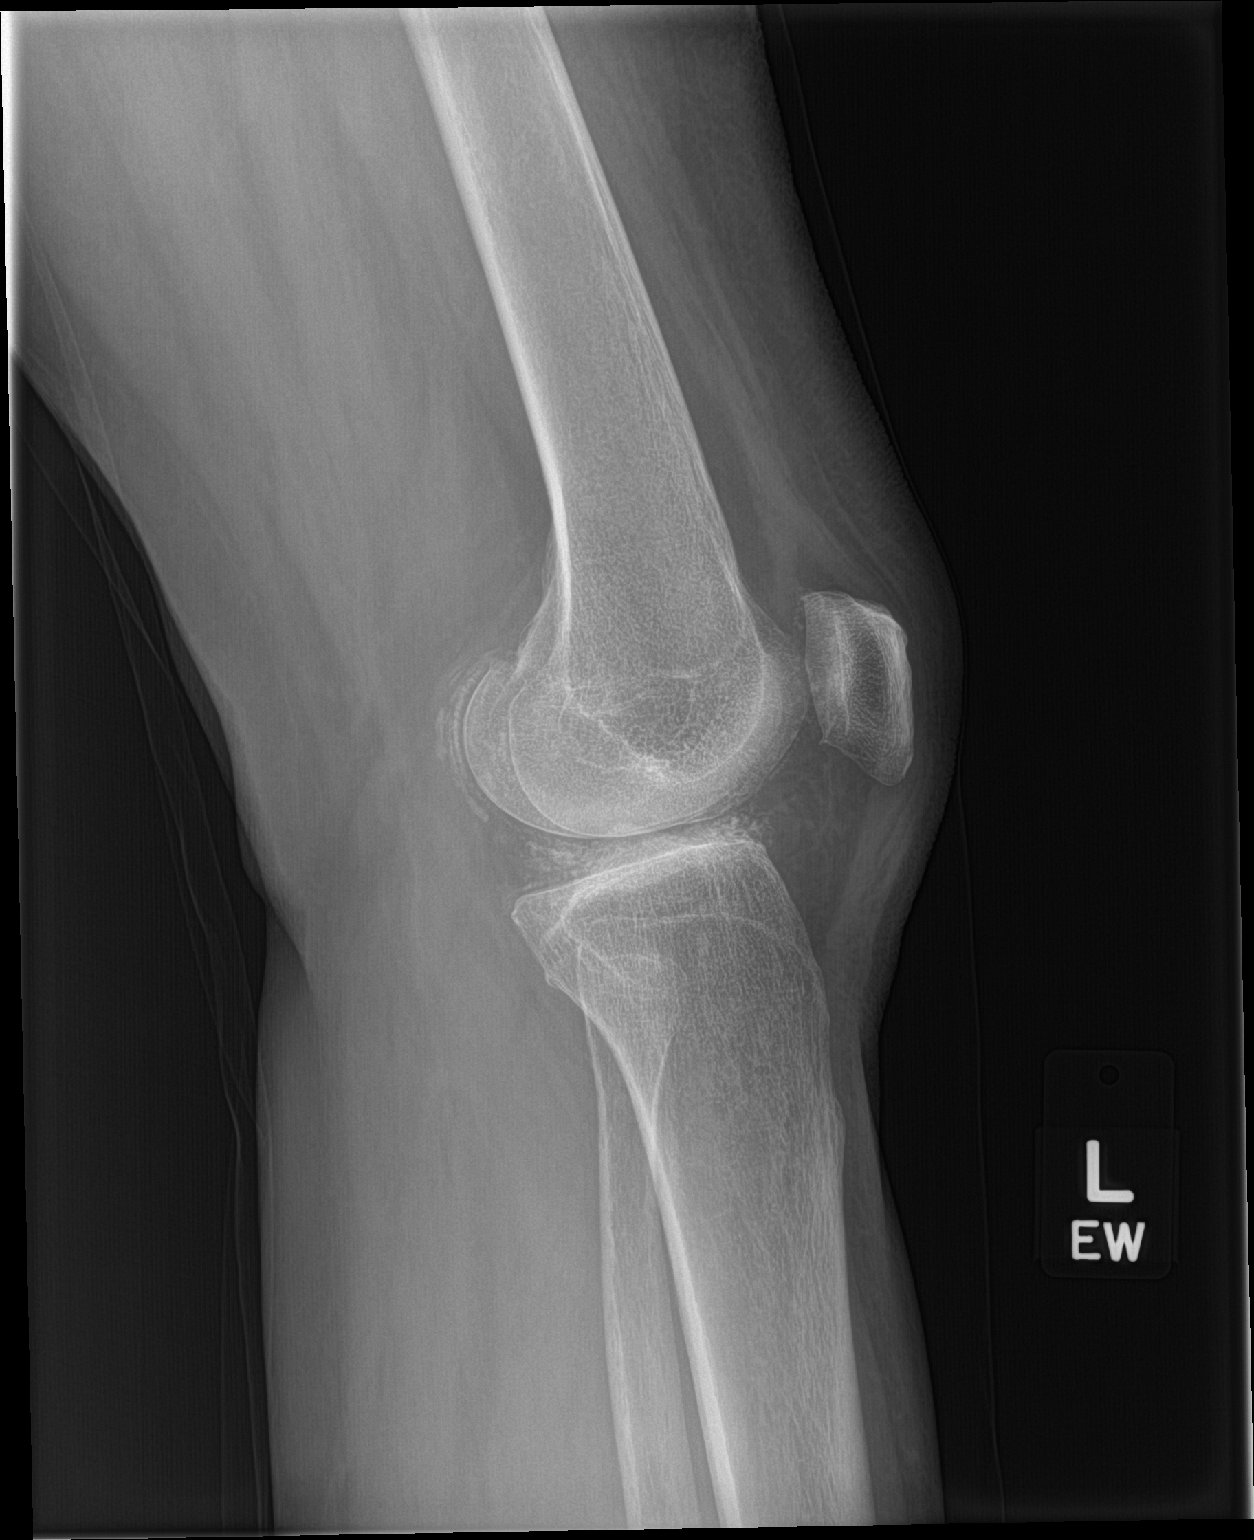

[2 of 2 positions shown; findings below may reference images not displayed]

FINDINGS: Two views of the left knee submitted. No acute fracture or
subluxation. Significant chondrocalcinosis. Mild narrowing of
patellofemoral joint space. Tiny joint effusion.
IMPRESSION: No acute fracture or subluxation. Significant chondrocalcinosis.
Mild degenerative changes.

## 2017-06-30 ENCOUNTER — Ambulatory Visit (INDEPENDENT_AMBULATORY_CARE_PROVIDER_SITE_OTHER): Payer: Medicare Other | Admitting: Family Medicine

## 2017-06-30 ENCOUNTER — Encounter: Payer: Self-pay | Admitting: Family Medicine

## 2017-06-30 ENCOUNTER — Encounter

## 2017-06-30 VITALS — BP 122/66 | HR 88 | Temp 97.6°F | Resp 16 | Ht 66.0 in | Wt 147.4 lb

## 2017-06-30 DIAGNOSIS — F1021 Alcohol dependence, in remission: Secondary | ICD-10-CM

## 2017-06-30 DIAGNOSIS — E876 Hypokalemia: Secondary | ICD-10-CM | POA: Insufficient documentation

## 2017-06-30 DIAGNOSIS — E871 Hypo-osmolality and hyponatremia: Secondary | ICD-10-CM

## 2017-06-30 DIAGNOSIS — J302 Other seasonal allergic rhinitis: Secondary | ICD-10-CM | POA: Diagnosis not present

## 2017-06-30 DIAGNOSIS — R197 Diarrhea, unspecified: Secondary | ICD-10-CM | POA: Diagnosis not present

## 2017-06-30 LAB — COMPREHENSIVE METABOLIC PANEL
ALBUMIN: 4.8 g/dL (ref 3.5–5.2)
ALT: 8 U/L (ref 0–35)
AST: 14 U/L (ref 0–37)
Alkaline Phosphatase: 68 U/L (ref 39–117)
BILIRUBIN TOTAL: 0.5 mg/dL (ref 0.2–1.2)
BUN: 7 mg/dL (ref 6–23)
CALCIUM: 10.4 mg/dL (ref 8.4–10.5)
CHLORIDE: 85 meq/L — AB (ref 96–112)
CO2: 28 mEq/L (ref 19–32)
CREATININE: 0.8 mg/dL (ref 0.40–1.20)
GFR: 75.13 mL/min (ref >60.00)
Glucose, Bld: 102 mg/dL — ABNORMAL HIGH (ref 70–99)
Potassium: 4.3 mEq/L (ref 3.5–5.1)
Sodium: 123 mEq/L — ABNORMAL LOW (ref 135–145)
TOTAL PROTEIN: 7.1 g/dL (ref 6.0–8.3)

## 2017-06-30 MED ORDER — POTASSIUM CHLORIDE CRYS ER 20 MEQ PO TBCR: 20.0000 meq | EXTENDED_RELEASE_TABLET | Freq: Once | ORAL | 3 refills | Status: DC

## 2017-06-30 MED ORDER — FLUTICASONE PROPIONATE 50 MCG/ACT NA SUSP: 2.0000 | Freq: Every day | NASAL | 6 refills | Status: DC

## 2017-06-30 MED ORDER — POTASSIUM CHLORIDE CRYS ER 20 MEQ PO TBCR
20.0000 meq | EXTENDED_RELEASE_TABLET | Freq: Two times a day (BID) | ORAL | 0 refills | Status: DC
Start: 2017-06-30 — End: 2017-06-30

## 2017-06-30 MED ORDER — LEVOCETIRIZINE DIHYDROCHLORIDE 5 MG PO TABS: 5.0000 mg | ORAL_TABLET | Freq: Every evening | ORAL | 5 refills | Status: DC

## 2017-06-30 NOTE — Assessment & Plan Note (Signed)
xyzal and flonase  rto prn

## 2017-06-30 NOTE — Assessment & Plan Note (Signed)
Check labs today Refill K

## 2017-06-30 NOTE — Assessment & Plan Note (Signed)
Pt has not completely stopped drinking wine but states she only has 1-2 glasses every few weeks

## 2017-06-30 NOTE — Patient Instructions (Signed)
Hyponatremia Hyponatremia is when the amount of salt (sodium) in your blood is too low. When salt levels are low, your cells absorb extra water and they swell. The swelling happens throughout the body, but it mostly affects the brain. Follow these instructions at home:  Take medicines only as told by your doctor. Many medicines can make this condition worse. Talk with your doctor about any medicines that you are currently taking.  Carefully follow a recommended diet as told by your doctor.  Carefully follow instructions from your doctor about fluid restrictions.  Keep all follow-up visits as told by your doctor. This is important.  Do not drink alcohol. Contact a doctor if:  You feel sicker to your stomach (nauseous).  You feel more confused.  You feel more tired (fatigued).  Your headache gets worse.  You feel weaker.  Your symptoms go away and then they come back.  You have trouble following the diet instructions. Get help right away if:  You start to twitch and shake (have a seizure).  You pass out (faint).  You keep having watery poop (diarrhea).  You keep throwing up (vomiting). This information is not intended to replace advice given to you by your health care provider. Make sure you discuss any questions you have with your health care provider. Document Released: 09/26/2010 Document Revised: 06/22/2015 Document Reviewed: 01/10/2014 [employer] Interactive Patient Education  2018 [employer]  

## 2017-06-30 NOTE — Progress Notes (Signed)
Patient ID: Toni [name], female    DOB: Oct 22, 1945  Age: 72 y.o. MRN: [id number]    Subjective:  Subjective  HPI Toni [name] presents for f/u from er for low sodium/ potassium -- she presented to er with weakness, Nausea She d/c with potassium supplement for 2 days --- she has been out for a while and  Has started to feel a little light headed again.  She is also c/o allergy symptoms.   + sneezing , itchy eyes etc.  She does not have anymore xyzal and buying it otc is too expensive.    Review of Systems  Constitutional: Negative for appetite change, diaphoresis, fatigue and unexpected weight change.  HENT: Positive for congestion, postnasal drip, rhinorrhea and sneezing. Negative for ear pain, facial swelling, hearing loss, mouth sores, nosebleeds, sinus pressure, sinus pain and sore throat.   Eyes: Negative for pain, redness and visual disturbance.  Respiratory: Negative for cough, chest tightness, shortness of breath and wheezing.   Cardiovascular: Negative for chest pain, palpitations and leg swelling.  Gastrointestinal: Positive for diarrhea and nausea. Negative for constipation.  Endocrine: Negative for cold intolerance, heat intolerance, polydipsia, polyphagia and polyuria.  Genitourinary: Negative for difficulty urinating, dysuria and frequency.  Neurological: Positive for light-headedness. Negative for dizziness, numbness and headaches.    History Past Medical History:  Diagnosis Date  . Bipolar disorder ([place])    pt denies  . COPD (chronic obstructive pulmonary disease) ([place])   . Depression   . Diverticulitis   . Diverticulitis   . Fibromyalgia   . Gallstones 11/09/2015  . GERD (gastroesophageal reflux disease)   . Headache   . Hyperlipidemia   . Hypertension   . Osteoarthritis     She has a past surgical history that includes Tonsillectomy and adenoidectomy; Total hip arthroplasty (Left); Femur fracture surgery (09/2009); Joint replacement (Right, 09/2009); Colon  resection (2001); Colostomy closure (2011); Cholecystectomy (N/A, 12/04/2015); and Ankle arthroscopy with open reduction internal fixation (orif) (Right, 06/20/2016).   Her family history includes Achalasia in her father; Alcohol abuse in her unknown relative; Arthritis in her unknown relative; Cancer in her unknown relative; Depression in her unknown relative; Stroke in her father and mother.She reports that she has been smoking.  She has never used smokeless tobacco. She reports that she drinks alcohol. She reports that she has current or past drug history. Drug: Marijuana. Frequency: 14.00 times per week.  Current Outpatient Medications on File Prior to Visit  Medication Sig Dispense Refill  . ALPRAZolam (XANAX) 1 MG tablet TAKE 1/2 TABLET BY MOUTH THREE TIMES DAILY AS NEEDED--- dr Casimiro Needle to take over (Patient taking differently: Take 1 mg by mouth 3 (three) times daily as needed for anxiety. dr Casimiro Needle to take over) 30 tablet 0  . Biotin 1000 MCG tablet Take 5,000 mcg by mouth daily.     . Calcium Citrate (CITRACAL PO) Take 1 tablet by mouth daily.    . cholecalciferol (VITAMIN D) 1000 UNITS tablet Take 1,000 Units by mouth daily.     Marland Kitchen gabapentin (NEURONTIN) 600 MG tablet Take 1 tablet (600 mg total) by mouth 3 (three) times daily. 90 tablet 0  . lamoTRIgine (LAMICTAL) 100 MG tablet Take 1 tablet (100 mg total) by mouth 2 (two) times daily. 60 tablet 0  . losartan-hydrochlorothiazide (HYZAAR) 100-25 MG tablet Take 1 tablet by mouth daily. 90 tablet 1  . Magnesium Citrate 100 MG TABS Take 1 tablet by mouth every evening.     . temazepam (RESTORIL) 15  MG capsule Take 30 mg by mouth at bedtime.   1  . tiZANidine (ZANAFLEX) 4 MG tablet Take 1-2 tablets (4-8 mg total) by mouth every 6 (six) hours as needed. (Patient taking differently: Take 4-8 mg by mouth every 6 (six) hours as needed for muscle spasms. ) 30 tablet 0  . Valerian 450 MG CAPS Take 1 capsule by mouth every evening.     . venlafaxine  XR (EFFEXOR-XR) 75 MG 24 hr capsule Take 75 mg by mouth daily.     Current Facility-Administered Medications on File Prior to Visit  Medication Dose Route Frequency Provider Last Rate Last Dose  . 0.9 %  sodium chloride infusion  500 mL Intravenous Continuous Milus Banister, MD         Objective:  Objective  Physical Exam  Constitutional: She is oriented to person, place, and time. She appears well-developed and well-nourished.  HENT:  Head: Normocephalic and atraumatic.  Right Ear: External ear normal.  Left Ear: External ear normal.  Nose: Mucosal edema and rhinorrhea present. No sinus tenderness, nasal deformity, septal deviation or nasal septal hematoma. Right sinus exhibits no maxillary sinus tenderness and no frontal sinus tenderness. Left sinus exhibits no maxillary sinus tenderness and no frontal sinus tenderness.  Mouth/Throat: Posterior oropharyngeal erythema present. No oropharyngeal exudate or posterior oropharyngeal edema.  + PND + errythema  Eyes: Conjunctivae and EOM are normal. Right eye exhibits no discharge. Left eye exhibits no discharge.  Neck: Normal range of motion. Neck supple. No JVD present. Carotid bruit is not present. No thyromegaly present.  Cardiovascular: Normal rate, regular rhythm and normal heart sounds.  No murmur heard. Pulmonary/Chest: Effort normal and breath sounds normal. No respiratory distress. She has no wheezes. She has no rales. She exhibits no tenderness.  Musculoskeletal: She exhibits no edema.  Lymphadenopathy:    She has cervical adenopathy.  Neurological: She is alert and oriented to person, place, and time.  Psychiatric: She has a normal mood and affect.  Nursing note and vitals reviewed.  BP 122/66 (BP Location: Left Arm, Cuff Size: Normal)   Pulse 88   Temp 97.6 F (36.4 C) (Oral)   Resp 16   Ht 5\' 6"  (1.676 m)   Wt 147 lb 6.4 oz (66.9 kg)   SpO2 98%   BMI 23.79 kg/m  Wt Readings from Last 3 Encounters:  06/30/17 147 lb  6.4 oz (66.9 kg)  06/07/17 145 lb (65.8 kg)  04/11/17 154 lb 3.2 oz (69.9 kg)     Lab Results  Component Value Date   WBC 8.9 06/06/2017   HGB 12.6 06/06/2017   HCT 34.5 (L) 06/06/2017   PLT 301 06/06/2017   GLUCOSE 114 (H) 06/07/2017   CHOL 239 (H) 11/26/2016   TRIG 106.0 11/26/2016   HDL 75.50 11/26/2016   LDLDIRECT 162.3 05/11/2012   LDLCALC 142 (H) 11/26/2016   ALT 13 (L) 06/07/2017   AST 17 06/07/2017   NA 127 (L) 06/07/2017   K 3.1 (L) 06/07/2017   CL 93 (L) 06/07/2017   CREATININE 0.83 06/07/2017   BUN 17 06/07/2017   CO2 22 06/07/2017   TSH 1.32 05/03/2016   INR 1.05 09/30/2009   HGBA1C 5.7 04/14/2007    No results found.   Assessment & Plan:  Plan  I have discontinued Julio Jeppsen's ibuprofen, levocetirizine, guaiFENesin, promethazine-dextromethorphan, levocetirizine, cefadroxil, and ondansetron. I am also having her start on levocetirizine and fluticasone. Additionally, I am having her maintain her cholecalciferol, Biotin, temazepam, Valerian,  Magnesium Citrate, Calcium Citrate (CITRACAL PO), venlafaxine XR, ALPRAZolam, gabapentin, lamoTRIgine, losartan-hydrochlorothiazide, tiZANidine, and potassium chloride SA. We will continue to administer sodium chloride.  Meds ordered this encounter  Medications  . DISCONTD: potassium chloride SA (K-DUR,KLOR-CON) 20 MEQ tablet    Sig: Take 1 tablet (20 mEq total) by mouth 2 (two) times daily.    Dispense:  8 tablet    Refill:  0  . levocetirizine (XYZAL) 5 MG tablet    Sig: Take 1 tablet (5 mg total) by mouth every evening.    Dispense:  30 tablet    Refill:  5  . DISCONTD: potassium chloride SA (K-DUR,KLOR-CON) 20 MEQ tablet    Sig: Take 1 tablet (20 mEq total) by mouth once for 1 dose.    Dispense:  90 tablet    Refill:  3  . potassium chloride SA (K-DUR,KLOR-CON) 20 MEQ tablet    Sig: Take 1 tablet (20 mEq total) by mouth once for 1 dose.    Dispense:  90 tablet    Refill:  3  . fluticasone (FLONASE) 50  MCG/ACT nasal spray    Sig: Place 2 sprays into both nostrils daily.    Dispense:  16 g    Refill:  6    Problem List Items Addressed This Visit      Unprioritized   ALCOHOL ABUSE, IN REMISSION, HX OF    Pt has not completely stopped drinking wine but states she only has 1-2 glasses every few weeks       Diarrhea    Really occasional loose stools  con't K [place] fiber in diet Probiotic If no relief f/u GI      Hypokalemia - Primary    Check labs today Refill K      Relevant Medications   potassium chloride SA (K-DUR,KLOR-CON) 20 MEQ tablet   Other Relevant Orders   Comprehensive metabolic panel   Low sodium levels   Relevant Orders   Comprehensive metabolic panel   Seasonal allergies    xyzal and flonase  rto prn       Relevant Medications   levocetirizine (XYZAL) 5 MG tablet   fluticasone (FLONASE) 50 MCG/ACT nasal spray      Follow-up: Return in about 6 months (around 12/30/2017), or if symptoms worsen or fail to improve, for fasting, hypertension.  Ann Held, DO

## 2017-06-30 NOTE — Assessment & Plan Note (Signed)
Really occasional loose stools  con't [place] fiber in diet Probiotic If no relief f/u GI

## 2017-07-16 DIAGNOSIS — F331 Major depressive disorder, recurrent, moderate: Secondary | ICD-10-CM | POA: Diagnosis not present

## 2017-07-17 ENCOUNTER — Encounter: Payer: Self-pay | Admitting: Family Medicine

## 2017-07-21 ENCOUNTER — Encounter: Payer: Self-pay | Admitting: *Deleted

## 2017-07-21 ENCOUNTER — Ambulatory Visit (INDEPENDENT_AMBULATORY_CARE_PROVIDER_SITE_OTHER): Payer: Medicare Other | Admitting: Family Medicine

## 2017-07-21 ENCOUNTER — Encounter: Payer: Self-pay | Admitting: Family Medicine

## 2017-07-21 ENCOUNTER — Ambulatory Visit (INDEPENDENT_AMBULATORY_CARE_PROVIDER_SITE_OTHER): Payer: Medicare Other | Admitting: *Deleted

## 2017-07-21 VITALS — BP 130/72 | HR 89 | Ht 66.0 in | Wt 150.0 lb

## 2017-07-21 VITALS — BP 130/72 | HR 87 | Temp 98.1°F | Resp 18 | Wt 150.4 lb

## 2017-07-21 DIAGNOSIS — R0602 Shortness of breath: Secondary | ICD-10-CM | POA: Diagnosis not present

## 2017-07-21 DIAGNOSIS — F1210 Cannabis abuse, uncomplicated: Secondary | ICD-10-CM

## 2017-07-21 DIAGNOSIS — Z0000 Encounter for general adult medical examination without abnormal findings: Secondary | ICD-10-CM | POA: Diagnosis not present

## 2017-07-21 DIAGNOSIS — R5383 Other fatigue: Secondary | ICD-10-CM

## 2017-07-21 DIAGNOSIS — R531 Weakness: Secondary | ICD-10-CM

## 2017-07-21 DIAGNOSIS — F1021 Alcohol dependence, in remission: Secondary | ICD-10-CM

## 2017-07-21 DIAGNOSIS — E782 Mixed hyperlipidemia: Secondary | ICD-10-CM

## 2017-07-21 DIAGNOSIS — S82891S Other fracture of right lower leg, sequela: Secondary | ICD-10-CM

## 2017-07-21 DIAGNOSIS — E2839 Other primary ovarian failure: Secondary | ICD-10-CM | POA: Diagnosis not present

## 2017-07-21 DIAGNOSIS — M8580 Other specified disorders of bone density and structure, unspecified site: Secondary | ICD-10-CM | POA: Diagnosis not present

## 2017-07-21 DIAGNOSIS — R197 Diarrhea, unspecified: Secondary | ICD-10-CM

## 2017-07-21 DIAGNOSIS — I10 Essential (primary) hypertension: Secondary | ICD-10-CM | POA: Diagnosis not present

## 2017-07-21 NOTE — Patient Instructions (Signed)
Weakness  Weakness is a lack of strength. You may feel weak all over your body (generalized), or you may feel weak in one specific part of your body (focal). Common causes of weakness include:   Infection and immune system disorders.   Physical exhaustion.   Internal bleeding or other blood loss that results in a lack of red blood cells (anemia).   Dehydration.   An imbalance in mineral (electrolyte) levels, such as potassium.   Heart disease, circulation problems, or stroke.    Other causes include:   Some medicines or cancer treatment.   Stress, anxiety, or depression.   Nervous system disorders.   Thyroid disorders.   Loss of muscle strength because of age or inactivity.   Poor sleep quality or sleep disorders.    The cause of your weakness may not be known. Some causes of weakness can be serious, so it is important to see your health care provider.  Follow these instructions at home:   Rest as needed.   Try to get enough sleep. Talk to your health care provider about how much sleep you need each night.   Take over-the-counter and prescription medicines only as told by your health care provider.   Eat a healthy, well-balanced diet. This includes:  ? Proteins to build muscles, such as lean meats and fish.  ? Fresh fruits and vegetables.  ? Carbohydrates to boost energy, such as whole grains.   Drink enough fluid to keep your urine clear or pale yellow.   Do strength exercises, such as arm curls and leg raises, for 30 minutes at least 2 days a week or as told by your health care provider.   Consider working with a physical therapist or trainer who can develop an exercise plan to help you gain muscle strength.   Keep all follow-up visits as told by your health care provider. This is important.  Contact a health care provider if:   Your weakness does not improve or gets worse.   Your weakness affects your ability to think clearly.   Your weakness affects your ability to do your normal daily  activities.  Get help right away if:   You develop sudden weakness.   You have trouble breathing or shortness of breath.   You have problems with your vision.   You have trouble talking or swallowing.   You have trouble standing or walking.   You have chest pain.   You are light-headed or lose consciousness.  This information is not intended to replace advice given to you by your health care provider. Make sure you discuss any questions you have with your health care provider.  Document Released: 01/14/2005 Document Revised: 02/09/2015 Document Reviewed: 11/04/2014  Elsevier Interactive Patient Education  2018 [employer]

## 2017-07-21 NOTE — Assessment & Plan Note (Signed)
Pt is drinking again

## 2017-07-21 NOTE — Progress Notes (Signed)
Reviewed  Yvonne R Lowne Chase, DO  

## 2017-07-21 NOTE — Patient Instructions (Signed)
Please schedule your next medicare wellness visit with me in 1 yr.   Toni [name] , Thank you for taking time to come for your Medicare Wellness Visit. I appreciate your ongoing commitment to your health goals. Please review the following plan we discussed and let me know if I can assist you in the future.   These are the goals we discussed: Goals    . Continue with counseling       This is a list of the screening recommended for you and due dates:  Health Maintenance  Topic Date Due  . Flu Shot  11/26/2017*  . Tetanus Vaccine  04/12/2018*  . Mammogram  05/18/2018  . Colon Cancer Screening  10/05/2020  . DEXA scan (bone density measurement)  Completed  .  Hepatitis C: One time screening is recommended by Center for Disease Control  (CDC) for  adults born from [birth year] through 1964.   Completed  . Pneumonia vaccines  Completed  *Topic was postponed. The date shown is not the original due date.

## 2017-07-21 NOTE — Assessment & Plan Note (Signed)
Pt con't to smoke daily

## 2017-07-21 NOTE — Assessment & Plan Note (Signed)
Well controlled, no changes to meds. Encouraged heart healthy diet such as the DASH diet and exercise as tolerated.  °

## 2017-07-21 NOTE — Progress Notes (Signed)
Subjective:  I acted as a Education administrator for [employer]. Yancey Flemings, [place]   Patient ID: Toni [name], female    DOB: 1945-11-25, 72 y.o.   MRN: [id number]  Chief Complaint  Patient presents with  . Hypertension  . Fatigue    HPI  Patient is in today for follow up on hypertension, and fatigue. She also c/o of some sob. No chest pain.   Pt also c/o con't loose stools ---  She is requesting to go back to GI  Patient Care Team: Carollee Herter, Alferd Apa, DO as PCP - General Norma Fredrickson, MD as Consulting Physician (Psychiatry) Norma Fredrickson, MD as Consulting Physician (Psychiatry) Ralene Bathe, MD as Consulting Physician (Ophthalmology) Milus Banister, MD as Consulting Physician (Gastroenterology)   Past Medical History:  Diagnosis Date  . Bipolar disorder ([place])    pt denies  . COPD (chronic obstructive pulmonary disease) ([place])   . Depression   . Diverticulitis   . Diverticulitis   . Fibromyalgia   . Gallstones 11/09/2015  . GERD (gastroesophageal reflux disease)   . Headache   . Hyperlipidemia   . Hypertension   . Osteoarthritis     Past Surgical History:  Procedure Laterality Date  . ANKLE ARTHROSCOPY WITH OPEN REDUCTION INTERNAL FIXATION (ORIF) Right 06/20/2016   second surgery 06/28/2016 in [place]  . CHOLECYSTECTOMY N/A 12/04/2015   Procedure: LAPAROSCOPIC CHOLECYSTECTOMY WITH INTRAOPERATIVE CHOLANGIOGRAM;  Surgeon: Fanny Skates, MD;  Location: [facility];  Service: General;  Laterality: N/A;  . COLON RESECTION  2001   sigmoid colon removed  . COLOSTOMY CLOSURE  2011  . FEMUR FRACTURE SURGERY  09/2009  . JOINT REPLACEMENT Right 09/2009   R hip,  redone, 06/14/2010 [place],---  done secondary to fractured fractured x2  . TONSILLECTOMY AND ADENOIDECTOMY    . TOTAL HIP ARTHROPLASTY Left    1979 and 1986    Family History  Problem Relation Age of Onset  . Stroke Mother   . Arthritis Unknown        mother family  . Depression Unknown        mother fanily  .  Alcohol abuse Unknown        Both sides  . Cancer Unknown        mothers side  . Stroke Father   . Achalasia Father   . Colon cancer Neg Hx     Social History   Socioeconomic History  . Marital status: Single    Spouse name: Not on file  . Number of children: 1  . Years of education: Not on file  . Highest education level: Not on file  Occupational History  . Occupation: retired  Scientific laboratory technician  . Financial resource strain: Not on file  . Food insecurity:    Worry: Not on file    Inability: Not on file  . Transportation needs:    Medical: Not on file    Non-medical: Not on file  Tobacco Use  . Smoking status: Former Smoker    Last attempt to quit: 06/18/2011    Years since quitting: 6.0  . Smokeless tobacco: Never Used  . Tobacco comment: marijuana,  daily for years   Substance and Sexual Activity  . Alcohol use: Yes    Comment: 2-3 bottles of wine/week  . Drug use: Yes    Frequency: 14.0 times per week    Types: Marijuana    Comment: - daily use  . Sexual activity: Not Currently    Partners: Male  Lifestyle  . Physical activity:    Days per week: Not on file    Minutes per session: Not on file  . Stress: Not on file  Relationships  . Social connections:    Talks on phone: Not on file    Gets together: Not on file    Attends religious service: Not on file    Active member of club or organization: Not on file    Attends meetings of clubs or organizations: Not on file    Relationship status: Not on file  . Intimate partner violence:    Fear of current or ex partner: Not on file    Emotionally abused: Not on file    Physically abused: Not on file    Forced sexual activity: Not on file  Other Topics Concern  . Not on file  Social History Narrative   Lives alone in a one story home.  Retired from different jobs.  Education: college.  Has one child.     Outpatient Medications Prior to Visit  Medication Sig Dispense Refill  . ALPRAZolam (XANAX) 1 MG tablet  TAKE 1/2 TABLET BY MOUTH THREE TIMES DAILY AS NEEDED--- dr Casimiro Needle to take over (Patient taking differently: Take 1 mg by mouth 3 (three) times daily as needed for anxiety. dr Casimiro Needle to take over) 30 tablet 0  . Biotin 1000 MCG tablet Take 5,000 mcg by mouth daily.     . Calcium Citrate (CITRACAL PO) Take 1 tablet by mouth daily.    . cholecalciferol (VITAMIN D) 1000 UNITS tablet Take 1,000 Units by mouth daily.     . fluticasone (FLONASE) 50 MCG/ACT nasal spray Place 2 sprays into both nostrils daily. 16 g 6  . gabapentin (NEURONTIN) 600 MG tablet Take 1 tablet (600 mg total) by mouth 3 (three) times daily. 90 tablet 0  . lamoTRIgine (LAMICTAL) 100 MG tablet Take 1 tablet (100 mg total) by mouth 2 (two) times daily. 60 tablet 0  . levocetirizine (XYZAL) 5 MG tablet Take 1 tablet (5 mg total) by mouth every evening. 30 tablet 5  . losartan-hydrochlorothiazide (HYZAAR) 100-25 MG tablet Take 1 tablet by mouth daily. 90 tablet 1  . Magnesium Citrate 100 MG TABS Take 1 tablet by mouth every evening.     . temazepam (RESTORIL) 15 MG capsule Take 30 mg by mouth at bedtime.   1  . tiZANidine (ZANAFLEX) 4 MG tablet Take 1-2 tablets (4-8 mg total) by mouth every 6 (six) hours as needed. (Patient taking differently: Take 4-8 mg by mouth every 6 (six) hours as needed for muscle spasms. ) 30 tablet 0  . Valerian 450 MG CAPS Take 1 capsule by mouth every evening.     . venlafaxine XR (EFFEXOR-XR) 75 MG 24 hr capsule Take 75 mg by mouth daily.    . potassium chloride SA (K-DUR,KLOR-CON) 20 MEQ tablet Take 1 tablet (20 mEq total) by mouth once for 1 dose. 90 tablet 3   Facility-Administered Medications Prior to Visit  Medication Dose Route Frequency Provider Last Rate Last Dose  . 0.9 %  sodium chloride infusion  500 mL Intravenous Continuous Milus Banister, MD        Allergies  Allergen Reactions  . Levofloxacin Rash    REACTION: RASH/REDNESS    Review of Systems  Constitutional: Negative for  chills, fever and malaise/fatigue.  HENT: Negative for congestion and hearing loss.   Eyes: Negative for discharge.  Respiratory: Positive for shortness of breath. Negative for  cough and sputum production.   Cardiovascular: Negative for chest pain, palpitations and leg swelling.  Gastrointestinal: Negative for abdominal pain, blood in stool, constipation, diarrhea, heartburn, nausea and vomiting.  Genitourinary: Negative for dysuria, frequency, hematuria and urgency.  Musculoskeletal: Negative for back pain, falls and myalgias.  Skin: Negative for rash.  Neurological: Negative for dizziness, sensory change, loss of consciousness, weakness and headaches.  Endo/Heme/Allergies: Negative for environmental allergies. Does not bruise/bleed easily.  Psychiatric/Behavioral: Negative for depression and suicidal ideas. The patient is not nervous/anxious and does not have insomnia.        Objective:    Physical Exam  Constitutional: She is oriented to person, place, and time. She appears well-developed and well-nourished.  HENT:  Head: Normocephalic and atraumatic.  Eyes: Conjunctivae and EOM are normal.  Neck: Normal range of motion. Neck supple. No JVD present. Carotid bruit is not present. No thyromegaly present.  Cardiovascular: Normal rate, regular rhythm and normal heart sounds.  No murmur heard. Pulmonary/Chest: Effort normal and breath sounds normal. No respiratory distress. She has no wheezes. She has no rales. She exhibits no tenderness.  Musculoskeletal: She exhibits no edema.  Neurological: She is alert and oriented to person, place, and time.  Psychiatric: She has a normal mood and affect.  Nursing note and vitals reviewed.   BP 130/72 (BP Location: Left Arm, Patient Position: Sitting, Cuff Size: Normal)   Pulse 87   Temp 98.1 F (36.7 C) (Oral)   Resp 18   Wt 150 lb 6.4 oz (68.2 kg)   SpO2 97%   BMI 24.28 kg/m  Wt Readings from Last 3 Encounters:  07/21/17 150 lb (68 kg)    07/21/17 150 lb 6.4 oz (68.2 kg)  06/30/17 147 lb 6.4 oz (66.9 kg)   BP Readings from Last 3 Encounters:  07/21/17 130/72  07/21/17 130/72  06/30/17 122/66     Immunization History  Administered Date(s) Administered  . Influenza Whole 11/09/2008, 11/29/2010  . Influenza-Unspecified 10/29/2015  . Pneumococcal Conjugate-13 08/08/2014  . Pneumococcal Polysaccharide-23 05/11/2012    Health Maintenance  Topic Date Due  . INFLUENZA VACCINE  11/26/2017 (Originally 08/28/2017)  . TETANUS/TDAP  04/12/2018 (Originally 05/18/1965)  . MAMMOGRAM  05/18/2018  . COLONOSCOPY  10/05/2020  . DEXA SCAN  Completed  . Hepatitis C Screening  Completed  . PNA vac Low Risk Adult  Completed    Lab Results  Component Value Date   WBC 8.9 06/06/2017   HGB 12.6 06/06/2017   HCT 34.5 (L) 06/06/2017   PLT 301 06/06/2017   GLUCOSE 102 (H) 06/30/2017   CHOL 239 (H) 11/26/2016   TRIG 106.0 11/26/2016   HDL 75.50 11/26/2016   LDLDIRECT 162.3 05/11/2012   LDLCALC 142 (H) 11/26/2016   ALT 8 06/30/2017   AST 14 06/30/2017   NA 123 (L) 06/30/2017   K 4.3 06/30/2017   CL 85 (L) 06/30/2017   CREATININE 0.80 06/30/2017   BUN 7 06/30/2017   CO2 28 06/30/2017   TSH 1.32 05/03/2016   INR 1.05 09/30/2009   HGBA1C 5.7 04/14/2007    Lab Results  Component Value Date   TSH 1.32 05/03/2016   Lab Results  Component Value Date   WBC 8.9 06/06/2017   HGB 12.6 06/06/2017   HCT 34.5 (L) 06/06/2017   MCV 89.1 06/06/2017   PLT 301 06/06/2017   Lab Results  Component Value Date   NA 123 (L) 06/30/2017   K 4.3 06/30/2017   CO2 28 06/30/2017  GLUCOSE 102 (H) 06/30/2017   BUN 7 06/30/2017   CREATININE 0.80 06/30/2017   BILITOT 0.5 06/30/2017   ALKPHOS 68 06/30/2017   AST 14 06/30/2017   ALT 8 06/30/2017   PROT 7.1 06/30/2017   ALBUMIN 4.8 06/30/2017   CALCIUM 10.4 06/30/2017   ANIONGAP 12 06/07/2017   GFR 75.13 06/30/2017   Lab Results  Component Value Date   CHOL 239 (H) 11/26/2016   Lab  Results  Component Value Date   HDL 75.50 11/26/2016   Lab Results  Component Value Date   LDLCALC 142 (H) 11/26/2016   Lab Results  Component Value Date   TRIG 106.0 11/26/2016   Lab Results  Component Value Date   CHOLHDL 3 11/26/2016   Lab Results  Component Value Date   HGBA1C 5.7 04/14/2007         Assessment & Plan:   Problem List Items Addressed This Visit      Unprioritized   Diarrhea   Relevant Orders   Ambulatory referral to Gastroenterology   Clostridium difficile EIA   Stool culture    Other Visit Diagnoses    Fatigue, unspecified type    -  Primary   Relevant Orders   CBC with Differential/Platelet   Comprehensive metabolic panel   TSH   ECHOCARDIOGRAM COMPLETE   POCT Urinalysis Dipstick (Automated)   Weakness       Relevant Orders   CBC with Differential/Platelet   Comprehensive metabolic panel   TSH   ECHOCARDIOGRAM COMPLETE   POCT Urinalysis Dipstick (Automated)   SOB (shortness of breath)       Relevant Orders   ECHOCARDIOGRAM COMPLETE   Osteopenia, unspecified location       Relevant Orders   DG Bone Density   Closed fracture of right ankle, sequela       Relevant Orders   DG Bone Density   Estrogen deficiency       Relevant Orders   DG Bone Density      I am having Toni [name] maintain her cholecalciferol, Biotin, temazepam, Valerian, Magnesium Citrate, Calcium Citrate (CITRACAL PO), venlafaxine XR, ALPRAZolam, gabapentin, lamoTRIgine, losartan-hydrochlorothiazide, tiZANidine, levocetirizine, potassium chloride SA, and fluticasone. We will continue to administer sodium chloride.  No orders of the defined types were placed in this encounter.   CMA served as Education administrator during this visit. History, Physical and Plan performed by medical provider. Documentation and orders reviewed and attested to.  Ann Held, DO

## 2017-07-21 NOTE — Progress Notes (Signed)
Subjective:   Toni [name] is a 72 y.o. female who presents for Medicare Annual (Subsequent) preventive examination.  Review of Systems: No ROS.  Medicare Wellness Visit. Additional risk factors are reflected in the social history. Cardiac Risk Factors include: advanced age (>70men, >87 women);dyslipidemia;hypertension Sleep patterns: Sleep varies. Always feels tired Home Safety/Smoke Alarms: Feels safe in home. Smoke alarms in place.  Living environment; residence and Firearm Safety: Lives alone with cat.   Female:   Pap-  08/08/14-normal    Mammo- Pt states she will schedule.     Dexa scan- utd       CCS- last 10/06/15. Recall 5 yrs    Objective:     Vitals: BP 130/72 (BP Location: Left Arm, Patient Position: Sitting, Cuff Size: Normal) Comment: all vitals done by cma  Pulse 89   Ht 5\' 6"  (1.676 m)   Wt 150 lb (68 kg)   SpO2 97%   BMI 24.21 kg/m   Body mass index is 24.21 kg/m.  Advanced Directives 07/21/2017 06/07/2017 04/01/2016 11/29/2015  Does Patient Have a Medical Advance Directive? Yes No Yes No  Type of Paramedic of [place];Living will - [place];Living will -  Copy of [place] in Chart? Yes - Yes -  Would patient like information on creating a medical advance directive? - Yes (ED - Information included in AVS) - Yes - Educational materials given    Tobacco Social History   Tobacco Use  Smoking Status Former Smoker  . Last attempt to quit: 06/18/2011  . Years since quitting: 6.0  Smokeless Tobacco Never Used  Tobacco Comment   marijuana,  daily for years      Counseling given: Not Answered Comment: marijuana,  daily for years    Clinical Intake:     Pain : No/denies pain                 Past Medical History:  Diagnosis Date  . Bipolar disorder ([place])    pt denies  . COPD (chronic obstructive pulmonary disease) ([place])   . Depression   . Diverticulitis   . Diverticulitis   .  Fibromyalgia   . Gallstones 11/09/2015  . GERD (gastroesophageal reflux disease)   . Headache   . Hyperlipidemia   . Hypertension   . Osteoarthritis    Past Surgical History:  Procedure Laterality Date  . ANKLE ARTHROSCOPY WITH OPEN REDUCTION INTERNAL FIXATION (ORIF) Right 06/20/2016   second surgery 06/28/2016 in [place]  . CHOLECYSTECTOMY N/A 12/04/2015   Procedure: LAPAROSCOPIC CHOLECYSTECTOMY WITH INTRAOPERATIVE CHOLANGIOGRAM;  Surgeon: Fanny Skates, MD;  Location: [facility];  Service: General;  Laterality: N/A;  . COLON RESECTION  2001   sigmoid colon removed  . COLOSTOMY CLOSURE  2011  . FEMUR FRACTURE SURGERY  09/2009  . JOINT REPLACEMENT Right 09/2009   R hip,  redone, 06/14/2010 [place],---  done secondary to fractured fractured x2  . TONSILLECTOMY AND ADENOIDECTOMY    . TOTAL HIP ARTHROPLASTY Left    1979 and 1986   Family History  Problem Relation Age of Onset  . Stroke Mother   . Arthritis Unknown        mother family  . Depression Unknown        mother fanily  . Alcohol abuse Unknown        Both sides  . Cancer Unknown        mothers side  . Stroke Father   . Achalasia  Father   . Colon cancer Neg Hx    Social History   Socioeconomic History  . Marital status: Single    Spouse name: Not on file  . Number of children: 1  . Years of education: Not on file  . Highest education level: Not on file  Occupational History  . Occupation: retired  Scientific laboratory technician  . Financial resource strain: Not on file  . Food insecurity:    Worry: Not on file    Inability: Not on file  . Transportation needs:    Medical: Not on file    Non-medical: Not on file  Tobacco Use  . Smoking status: Former Smoker    Last attempt to quit: 06/18/2011    Years since quitting: 6.0  . Smokeless tobacco: Never Used  . Tobacco comment: marijuana,  daily for years   Substance and Sexual Activity  . Alcohol use: Yes    Comment: 2-3 bottles of wine/week  . Drug use: Yes    Frequency:  14.0 times per week    Types: Marijuana    Comment: - daily use  . Sexual activity: Not Currently    Partners: Male  Lifestyle  . Physical activity:    Days per week: Not on file    Minutes per session: Not on file  . Stress: Not on file  Relationships  . Social connections:    Talks on phone: Not on file    Gets together: Not on file    Attends religious service: Not on file    Active member of club or organization: Not on file    Attends meetings of clubs or organizations: Not on file    Relationship status: Not on file  Other Topics Concern  . Not on file  Social History Narrative   Lives alone in a one story home.  Retired from different jobs.  Education: college.  Has one child.     Outpatient Encounter Medications as of 07/21/2017  Medication Sig  . ALPRAZolam (XANAX) 1 MG tablet TAKE 1/2 TABLET BY MOUTH THREE TIMES DAILY AS NEEDED--- dr Casimiro Needle to take over (Patient taking differently: Take 1 mg by mouth 3 (three) times daily as needed for anxiety. dr Casimiro Needle to take over)  . Biotin 1000 MCG tablet Take 5,000 mcg by mouth daily.   . Calcium Citrate (CITRACAL PO) Take 1 tablet by mouth daily.  . cholecalciferol (VITAMIN D) 1000 UNITS tablet Take 1,000 Units by mouth daily.   . fluticasone (FLONASE) 50 MCG/ACT nasal spray Place 2 sprays into both nostrils daily.  Marland Kitchen gabapentin (NEURONTIN) 600 MG tablet Take 1 tablet (600 mg total) by mouth 3 (three) times daily.  Marland Kitchen lamoTRIgine (LAMICTAL) 100 MG tablet Take 1 tablet (100 mg total) by mouth 2 (two) times daily.  Marland Kitchen levocetirizine (XYZAL) 5 MG tablet Take 1 tablet (5 mg total) by mouth every evening.  Marland Kitchen losartan-hydrochlorothiazide (HYZAAR) 100-25 MG tablet Take 1 tablet by mouth daily.  . Magnesium Citrate 100 MG TABS Take 1 tablet by mouth every evening.   . potassium chloride SA (K-DUR,KLOR-CON) 20 MEQ tablet Take 1 tablet (20 mEq total) by mouth once for 1 dose.  . temazepam (RESTORIL) 15 MG capsule Take 30 mg by mouth at  bedtime.   Marland Kitchen tiZANidine (ZANAFLEX) 4 MG tablet Take 1-2 tablets (4-8 mg total) by mouth every 6 (six) hours as needed. (Patient taking differently: Take 4-8 mg by mouth every 6 (six) hours as needed for muscle spasms. )  .  Valerian 450 MG CAPS Take 1 capsule by mouth every evening.   . venlafaxine XR (EFFEXOR-XR) 75 MG 24 hr capsule Take 75 mg by mouth daily.   Facility-Administered Encounter Medications as of 07/21/2017  Medication  . 0.9 %  sodium chloride infusion    Activities of Daily Living In your present state of health, do you have any difficulty performing the following activities: 07/21/2017  Hearing? N  Vision? N  Difficulty concentrating or making decisions? N  Walking or climbing stairs? N  Dressing or bathing? N  Doing errands, shopping? N  Preparing Food and eating ? N  Using the Toilet? N  In the past six months, have you accidently leaked urine? N  Do you have problems with loss of bowel control? Y  Managing your Medications? N  Managing your Finances? N  Housekeeping or managing your Housekeeping? N  Some recent data might be hidden    Patient Care Team: Carollee Herter, Alferd Apa, DO as PCP - General Norma Fredrickson, MD as Consulting Physician (Psychiatry) Norma Fredrickson, MD as Consulting Physician (Psychiatry) Ralene Bathe, MD as Consulting Physician (Ophthalmology) Milus Banister, MD as Consulting Physician (Gastroenterology)    Assessment:   This is a routine wellness examination for [place]. Physical assessment deferred to PCP.  Exercise Activities and Dietary recommendations Current Exercise Habits: The patient does not participate in regular exercise at present  Goals    . Continue with counseling       Fall Risk Fall Risk  07/21/2017 11/26/2016 04/01/2016 08/08/2014 07/22/2013  Falls in the past year? No Yes Yes No No  Number falls in past yr: - 1 1 - -  Injury with Fall? - Yes No - -  Risk for fall due to : - - History of fall(s) - -  Risk for  fall due to: Comment - - - - -   Depression Screen PHQ 2/9 Scores 07/21/2017 11/26/2016 04/01/2016 08/08/2014  PHQ - 2 Score 2 2 6 1   PHQ- 9 Score 8 11 15  -      MMSE - Mini Mental State Exam 04/01/2016  Orientation to time 5  Orientation to Place 5  Registration 3  Attention/ Calculation 5  Recall 2  Language- name 2 objects 2  Language- repeat 1  Language- follow 3 step command 3  Language- read & follow direction 1  Write a sentence 1  Copy design 1  Total score 29        Immunization History  Administered Date(s) Administered  . Influenza Whole 11/09/2008, 11/29/2010  . Influenza-Unspecified 10/29/2015  . Pneumococcal Conjugate-13 08/08/2014  . Pneumococcal Polysaccharide-23 05/11/2012    Screening Tests Health Maintenance  Topic Date Due  . INFLUENZA VACCINE  11/26/2017 (Originally 08/28/2017)  . TETANUS/TDAP  04/12/2018 (Originally 05/18/1965)  . MAMMOGRAM  05/18/2018  . COLONOSCOPY  10/05/2020  . DEXA SCAN  Completed  . Hepatitis C Screening  Completed  . PNA vac Low Risk Adult  Completed      Plan:    Please schedule your next medicare wellness visit with me in 1 yr.   I have personally reviewed and noted the following in the patient's chart:   . Medical and social history . Use of alcohol, tobacco or illicit drugs  . Current medications and supplements . Functional ability and status . Nutritional status . Physical activity . Advanced directives . List of other physicians . Hospitalizations, surgeries, and ER visits in previous 12 months . Vitals . Screenings to  include cognitive, depression, and falls . Referrals and appointments  In addition, I have reviewed and discussed with patient certain preventive protocols, quality metrics, and best practice recommendations. A written personalized care plan for preventive services as well as general preventive health recommendations were provided to patient.     Naaman Plummer Kinney,  [place]  07/21/2017

## 2017-07-21 NOTE — Assessment & Plan Note (Signed)
Encouraged heart healthy diet, increase exercise, avoid trans fats, consider a krill oil cap daily 

## 2017-07-22 LAB — COMPREHENSIVE METABOLIC PANEL
ALBUMIN: 4.7 g/dL (ref 3.5–5.2)
ALK PHOS: 62 U/L (ref 39–117)
ALT: 12 U/L (ref 0–35)
AST: 15 U/L (ref 0–37)
BUN: 15 mg/dL (ref 6–23)
CALCIUM: 10.1 mg/dL (ref 8.4–10.5)
CHLORIDE: 87 meq/L — AB (ref 96–112)
CO2: 29 mEq/L (ref 19–32)
Creatinine, Ser: 0.68 mg/dL (ref 0.40–1.20)
GFR: 90.61 mL/min (ref >60.00)
Glucose, Bld: 108 mg/dL — ABNORMAL HIGH (ref 70–99)
Potassium: 4.9 mEq/L (ref 3.5–5.1)
Sodium: 123 mEq/L — ABNORMAL LOW (ref 135–145)
Total Bilirubin: 0.3 mg/dL (ref 0.2–1.2)
Total Protein: 7 g/dL (ref 6.0–8.3)

## 2017-07-22 LAB — CBC WITH DIFFERENTIAL/PLATELET
Basophils Absolute: 0.1 10*3/uL (ref 0.0–0.1)
Basophils Relative: 1.4 % (ref 0.0–3.0)
EOS ABS: 0.2 10*3/uL (ref 0.0–0.7)
EOS PCT: 2.2 % (ref 0.0–5.0)
HEMATOCRIT: 33.8 % — AB (ref 36.0–46.0)
HEMOGLOBIN: 12 g/dL (ref 12.0–15.0)
LYMPHS PCT: 27 % (ref 12.0–46.0)
Lymphs Abs: 1.9 10*3/uL (ref 0.7–4.0)
MCHC: 35.3 g/dL (ref 30.0–36.0)
MCV: 93.4 fl (ref 78.0–100.0)
Monocytes Absolute: 0.6 10*3/uL (ref 0.1–1.0)
Monocytes Relative: 7.8 % (ref 3.0–12.0)
Neutro Abs: 4.4 10*3/uL (ref 1.4–7.7)
Neutrophils Relative %: 61.6 % (ref 43.0–77.0)
Platelets: 317 10*3/uL (ref 150.0–400.0)
RBC: 3.63 Mil/uL — ABNORMAL LOW (ref 3.87–5.11)
RDW: 14 % (ref 11.5–15.5)
WBC: 7.2 10*3/uL (ref 4.0–10.5)

## 2017-07-22 LAB — POC URINALSYSI DIPSTICK (AUTOMATED)
Bilirubin, UA: NEGATIVE
Glucose, UA: NEGATIVE
KETONES UA: NEGATIVE
LEUKOCYTES UA: NEGATIVE
Nitrite, UA: NEGATIVE
PH UA: 8 (ref 5.0–8.0)
PROTEIN UA: NEGATIVE
RBC UA: NEGATIVE
SPEC GRAV UA: 1.01 (ref 1.010–1.025)
Urobilinogen, UA: 0.2 E.U./dL

## 2017-07-22 LAB — TSH: TSH: 1.87 u[IU]/mL (ref 0.35–4.50)

## 2017-07-22 NOTE — Addendum Note (Signed)
Addended by: Harl Bowie on: 07/22/2017 06:12 PM   Modules accepted: Orders

## 2017-07-29 ENCOUNTER — Other Ambulatory Visit: Payer: Medicare Other

## 2017-07-29 DIAGNOSIS — R5383 Other fatigue: Secondary | ICD-10-CM | POA: Diagnosis not present

## 2017-07-29 DIAGNOSIS — F331 Major depressive disorder, recurrent, moderate: Secondary | ICD-10-CM | POA: Diagnosis not present

## 2017-07-29 DIAGNOSIS — R197 Diarrhea, unspecified: Secondary | ICD-10-CM | POA: Diagnosis not present

## 2017-07-29 DIAGNOSIS — F419 Anxiety disorder, unspecified: Secondary | ICD-10-CM | POA: Diagnosis not present

## 2017-07-29 NOTE — Addendum Note (Signed)
Addended by: Caffie Pinto on: 07/29/2017 03:40 PM   Modules accepted: Orders

## 2017-07-30 ENCOUNTER — Telehealth: Payer: Self-pay

## 2017-07-30 NOTE — Telephone Encounter (Signed)
Copied from [place] [id number]. Topic: General - Call Back - No Documentation >> Jul 30, 2017 12:25 PM Conception Chancy, NT wrote: Reason for CRM: patient states she received a call from Dr. Etter Sjogren nurse and is returning her call. I do not see anything documented. Please contact pt.

## 2017-08-02 LAB — STOOL CULTURE
MICRO NUMBER: 90787252
MICRO NUMBER: 90787254
MICRO NUMBER:: 90787255
SHIGA RESULT: NOT DETECTED
SPECIMEN QUALITY: ADEQUATE
SPECIMEN QUALITY: ADEQUATE
SPECIMEN QUALITY:: ADEQUATE

## 2017-08-02 LAB — C. DIFFICILE GDH AND TOXIN A/B
GDH ANTIGEN: NOT DETECTED
MICRO NUMBER:: 90787253
SPECIMEN QUALITY: ADEQUATE
TOXIN A AND B: NOT DETECTED

## 2017-08-07 ENCOUNTER — Other Ambulatory Visit: Payer: Self-pay | Admitting: Family Medicine

## 2017-08-07 ENCOUNTER — Encounter: Payer: Self-pay | Admitting: Family Medicine

## 2017-08-07 DIAGNOSIS — M62838 Other muscle spasm: Secondary | ICD-10-CM

## 2017-08-11 ENCOUNTER — Telehealth: Payer: Self-pay | Admitting: *Deleted

## 2017-08-11 DIAGNOSIS — E871 Hypo-osmolality and hyponatremia: Secondary | ICD-10-CM

## 2017-08-11 NOTE — Telephone Encounter (Signed)
From patient mychart  I would like a nurse to call me re latests lab I was unable to manage link you sent for [employer]. Thanks

## 2017-08-12 ENCOUNTER — Ambulatory Visit (HOSPITAL_BASED_OUTPATIENT_CLINIC_OR_DEPARTMENT_OTHER): Payer: Medicare Other

## 2017-08-12 NOTE — Telephone Encounter (Signed)
Left message on machine to call back  

## 2017-08-12 NOTE — Telephone Encounter (Signed)
Pt is return sheketia call

## 2017-08-15 ENCOUNTER — Encounter: Payer: Self-pay | Admitting: Family Medicine

## 2017-08-15 NOTE — Telephone Encounter (Signed)
Patient called and states that she is returning a call back. She states if you can please call her cell. Please see below message

## 2017-08-15 NOTE — Telephone Encounter (Signed)
Left message on machine to call back.  Unsure what exactly patient is asking.

## 2017-08-15 NOTE — Telephone Encounter (Signed)
Patient stated that she has been having muscle weakness and some lightheadedness.  Advised that she schedule and ov for follow up.  OV scheduled for Monday.

## 2017-08-18 ENCOUNTER — Other Ambulatory Visit: Payer: Self-pay | Admitting: Family Medicine

## 2017-08-18 ENCOUNTER — Ambulatory Visit: Payer: Medicare Other | Admitting: Family Medicine

## 2017-08-18 DIAGNOSIS — K3184 Gastroparesis: Secondary | ICD-10-CM

## 2017-08-18 NOTE — Telephone Encounter (Signed)
Author phoned pt. to follow-up on lab question. Pt. Stated she has not heard anything about "why my sodium is so low!" and author relayed Dr. Nonda Lou message from 6/25. Pt states she has not had diarrhea recently since taking benefiber and probiotics. Pt denied change in diet or alcohol use, stating " only on the weekends sometimes" Pt. stated she fell yesterday tripping over a hose, and now has R arm pain, which is why pt. cancelled the appointment she made for today per report. Pt. Declined hitting head during fall. Author offered to reschedule appointment, but pt. declined. Findings routed to Dr. Etter Sjogren to review.

## 2017-08-19 NOTE — Telephone Encounter (Signed)
I do think its the alcohol---  But we can refer to endo ---- ([facility] does not do low sodium)_

## 2017-08-20 ENCOUNTER — Encounter: Payer: Self-pay | Admitting: Family Medicine

## 2017-08-20 NOTE — Telephone Encounter (Signed)
Author phoned pt to discuss option to be referred to endocrinology for low sodium if pt would like per Dr. Etter Sjogren. Pt. States she would be open to the referral. Referral placed and routed to Dr. Etter Sjogren for review.

## 2017-09-09 ENCOUNTER — Other Ambulatory Visit: Payer: Self-pay | Admitting: Family Medicine

## 2017-10-01 DIAGNOSIS — F331 Major depressive disorder, recurrent, moderate: Secondary | ICD-10-CM | POA: Diagnosis not present

## 2017-10-01 DIAGNOSIS — F419 Anxiety disorder, unspecified: Secondary | ICD-10-CM | POA: Diagnosis not present

## 2017-10-03 ENCOUNTER — Other Ambulatory Visit: Payer: Self-pay | Admitting: Family Medicine

## 2017-10-07 ENCOUNTER — Other Ambulatory Visit: Payer: Self-pay | Admitting: Family Medicine

## 2017-10-07 ENCOUNTER — Ambulatory Visit (HOSPITAL_BASED_OUTPATIENT_CLINIC_OR_DEPARTMENT_OTHER)
Admission: RE | Admit: 2017-10-07 | Discharge: 2017-10-07 | Disposition: A | Discharge status: Home / Self Care | Payer: Medicare Other | Source: Ambulatory Visit | Attending: Family Medicine | Admitting: Family Medicine

## 2017-10-07 ENCOUNTER — Ambulatory Visit (INDEPENDENT_AMBULATORY_CARE_PROVIDER_SITE_OTHER): Payer: Medicare Other | Admitting: Family Medicine

## 2017-10-07 ENCOUNTER — Encounter: Payer: Self-pay | Admitting: Family Medicine

## 2017-10-07 VITALS — BP 144/69 | HR 96 | Temp 98.7°F | Resp 16 | Ht 66.0 in | Wt 149.6 lb

## 2017-10-07 DIAGNOSIS — J0140 Acute pansinusitis, unspecified: Secondary | ICD-10-CM | POA: Diagnosis not present

## 2017-10-07 DIAGNOSIS — E871 Hypo-osmolality and hyponatremia: Secondary | ICD-10-CM

## 2017-10-07 DIAGNOSIS — R059 Cough, unspecified: Secondary | ICD-10-CM

## 2017-10-07 DIAGNOSIS — J40 Bronchitis, not specified as acute or chronic: Secondary | ICD-10-CM | POA: Diagnosis not present

## 2017-10-07 DIAGNOSIS — M25572 Pain in left ankle and joints of left foot: Secondary | ICD-10-CM

## 2017-10-07 DIAGNOSIS — S82402A Unspecified fracture of shaft of left fibula, initial encounter for closed fracture: Secondary | ICD-10-CM | POA: Diagnosis not present

## 2017-10-07 DIAGNOSIS — R05 Cough: Secondary | ICD-10-CM | POA: Insufficient documentation

## 2017-10-07 DIAGNOSIS — S82432A Displaced oblique fracture of shaft of left fibula, initial encounter for closed fracture: Secondary | ICD-10-CM

## 2017-10-07 DIAGNOSIS — R0602 Shortness of breath: Secondary | ICD-10-CM | POA: Diagnosis not present

## 2017-10-07 DIAGNOSIS — S82832A Other fracture of upper and lower end of left fibula, initial encounter for closed fracture: Secondary | ICD-10-CM | POA: Diagnosis not present

## 2017-10-07 MED ORDER — AZITHROMYCIN 250 MG PO TABS: ORAL_TABLET | ORAL | 0 refills | Status: DC

## 2017-10-07 MED ORDER — PREDNISONE 10 MG PO TABS: ORAL_TABLET | ORAL | 0 refills | Status: DC

## 2017-10-07 MED ORDER — PROMETHAZINE-DM 6.25-15 MG/5ML PO SYRP: 5.0000 mL | ORAL_SOLUTION | Freq: Four times a day (QID) | ORAL | 0 refills | Status: DC | PRN

## 2017-10-07 NOTE — Assessment & Plan Note (Signed)
Rest, ice elevate, ace -- pt has scooter  Check xray  rto prn

## 2017-10-07 NOTE — Assessment & Plan Note (Signed)
abx per orders flonase Cough med  rto prn

## 2017-10-07 NOTE — Patient Instructions (Signed)
Ankle Sprain An ankle sprain is a stretch or tear in one of the tough, fiber-like tissues (ligaments) in the ankle. The ligaments in your ankle help to hold the bones of the ankle together. What are the causes? This condition is often caused by stepping on or falling on the outer edge of the foot. What increases the risk? This condition is more likely to develop in people who play sports. What are the signs or symptoms? Symptoms of this condition include:  Pain in your ankle.  Swelling.  Bruising. Bruising may develop right after you sprain your ankle or 1-2 days later.  Trouble standing or walking, especially when you turn or change directions.  How is this diagnosed? This condition is diagnosed with a physical exam. During the exam, your health care provider will press on certain parts of your foot and ankle and try to move them in certain ways. X-rays may be taken to see how severe the sprain is and to check for broken bones. How is this treated? This condition may be treated with:  A brace. This is used to keep the ankle from moving until it heals.  An elastic bandage. This is used to support the ankle.  Crutches.  Pain medicine.  Surgery. This may be needed if the sprain is severe.  Physical therapy. This may help to improve the range of motion in the ankle.  Follow these instructions at home:  Rest your ankle.  Take over-the-counter and prescription medicines only as told by your health care provider.  For 2-3 days, keep your ankle raised (elevated) above the level of your heart as much as possible.  If directed, apply ice to the area: ? Put ice in a plastic bag. ? Place a towel between your skin and the bag. ? Leave the ice on for 20 minutes, 2-3 times a day.  If you were given a brace: ? Wear it as directed. ? Remove it to shower or bathe. ? Try not to move your ankle much, but wiggle your toes from time to time. This helps to prevent swelling.  If you were  given an elastic bandage (dressing): ? Remove it to shower or bathe. ? Try not to move your ankle much, but wiggle your toes from time to time. This helps to prevent swelling. ? Adjust the dressing to make it more comfortable if it feels too tight. ? Loosen the dressing if you have numbness or tingling in your foot, or if your foot becomes cold and blue.  If you have crutches, use them as told by your health care provider. Continue to use them until you can walk without feeling pain in your ankle. Contact a health care provider if:  You have rapidly increasing bruising or swelling.  Your pain is not relieved with medicine. Get help right away if:  Your toes or foot becomes numb or blue.  You have severe pain that gets worse. This information is not intended to replace advice given to you by your health care provider. Make sure you discuss any questions you have with your health care provider. Document Released: 01/14/2005 Document Revised: 02/22/2016 Document Reviewed: 08/16/2014 Elsevier Interactive Patient Education  2018 Elsevier Inc.  

## 2017-10-07 NOTE — Progress Notes (Signed)
Patient ID: Toni [name], female    DOB: 1945/03/07  Age: 72 y.o. MRN: [id number]    Subjective:  Subjective  HPI Toni [name] presents for sinus complaints.  And she fell and injured Toni [name] L ankle -- inversion injury-- on Sunday night.  Pain is lateral ankle Congestion front and max sinuses and prod cough x few months .  She thought it was allergies but otc not helping much  She has tried mucinex and xyzal with little relief.   Review of Systems  Constitutional: Negative for chills and fever.  HENT: Positive for postnasal drip, rhinorrhea, sinus pressure and sinus pain. Negative for congestion, hearing loss, sneezing and sore throat.   Eyes: Negative for discharge.  Respiratory: Positive for cough. Negative for chest tightness, shortness of breath and wheezing.   Cardiovascular: Negative for chest pain, palpitations and leg swelling.  Gastrointestinal: Negative for abdominal pain, blood in stool, constipation, diarrhea, nausea and vomiting.  Genitourinary: Negative for dysuria, frequency, hematuria and urgency.  Musculoskeletal: Positive for arthralgias, gait problem and joint swelling. Negative for back pain and myalgias.  Skin: Negative for rash.  Allergic/Immunologic: Negative for environmental allergies.  Neurological: Negative for dizziness, weakness and headaches.  Hematological: Does not bruise/bleed easily.  Psychiatric/Behavioral: Negative for suicidal ideas. The patient is not nervous/anxious.     History Past Medical History:  Diagnosis Date  . Bipolar disorder ([place])    pt denies  . COPD (chronic obstructive pulmonary disease) ([place])   . Depression   . Diverticulitis   . Diverticulitis   . Fibromyalgia   . Gallstones 11/09/2015  . GERD (gastroesophageal reflux disease)   . Headache   . Hyperlipidemia   . Hypertension   . Osteoarthritis     She has a past surgical history that includes Tonsillectomy and adenoidectomy; Total hip arthroplasty (Left); Femur fracture  surgery (09/2009); Joint replacement (Right, 09/2009); Colon resection (2001); Colostomy closure (2011); Cholecystectomy (N/A, 12/04/2015); and Ankle arthroscopy with open reduction internal fixation (orif) (Right, 06/20/2016).   Toni [name] family history includes Achalasia in Toni [name] father; Alcohol abuse in Toni [name] unknown relative; Arthritis in Toni [name] unknown relative; Cancer in Toni [name] unknown relative; Depression in Toni [name] unknown relative; Stroke in Toni [name] father and mother.She reports that she quit smoking about 6 years ago. She has never used smokeless tobacco. She reports that she drinks alcohol. She reports that she has current or past drug history. Drug: Marijuana. Frequency: 14.00 times per week.  Current Outpatient Medications on File Prior to Visit  Medication Sig Dispense Refill  . ALPRAZolam (XANAX) 1 MG tablet TAKE 1/2 TABLET BY MOUTH THREE TIMES DAILY AS NEEDED--- dr Casimiro Needle to take over (Patient taking differently: Take 1 mg by mouth 3 (three) times daily as needed for anxiety. dr Casimiro Needle to take over) 30 tablet 0  . Biotin 1000 MCG tablet Take 5,000 mcg by mouth daily.     . Calcium Citrate (CITRACAL PO) Take 1 tablet by mouth daily.    . cholecalciferol (VITAMIN D) 1000 UNITS tablet Take 1,000 Units by mouth daily.     . fluticasone (FLONASE) 50 MCG/ACT nasal spray Place 2 sprays into both nostrils daily. 16 g 6  . gabapentin (NEURONTIN) 600 MG tablet TAKE 1 TABLET(600 MG) BY MOUTH THREE TIMES DAILY 270 tablet 1  . lamoTRIgine (LAMICTAL) 100 MG tablet Take 1 tablet (100 mg total) by mouth 2 (two) times daily. 60 tablet 0  . levocetirizine (XYZAL) 5 MG tablet Take 1 tablet (5 mg total) by mouth every evening. [place]  tablet 5  . losartan-hydrochlorothiazide (HYZAAR) 100-25 MG tablet TAKE 1 TABLET BY MOUTH DAILY 90 tablet 0  . Magnesium Citrate 100 MG TABS Take 1 tablet by mouth every evening.     . temazepam (RESTORIL) 15 MG capsule Take 30 mg by mouth at bedtime.   1  . tiZANidine (ZANAFLEX) 4 MG tablet TAKE 1 TO  2 TABLETS(4 TO 8 MG) BY MOUTH EVERY 6 HOURS AS NEEDED 30 tablet 0  . Valerian 450 MG CAPS Take 1 capsule by mouth every evening.     . venlafaxine XR (EFFEXOR-XR) 75 MG 24 hr capsule Take 75 mg by mouth daily.    . potassium chloride SA (K-DUR,KLOR-CON) 20 MEQ tablet Take 1 tablet (20 mEq total) by mouth once for 1 dose. 90 tablet 3   Current Facility-Administered Medications on File Prior to Visit  Medication Dose Route Frequency Provider Last Rate Last Dose  . 0.9 %  sodium chloride infusion  500 mL Intravenous Continuous Milus Banister, MD         Objective:  Objective  Physical Exam  Constitutional: She is oriented to person, place, and time. She appears well-developed and well-nourished.  HENT:  Right Ear: External ear normal.  Left Ear: External ear normal.  + PND + errythema  Eyes: Conjunctivae are normal. Right eye exhibits no discharge. Left eye exhibits no discharge.  Cardiovascular: Normal rate, regular rhythm and normal heart sounds.  No murmur heard. Pulmonary/Chest: Effort normal. No respiratory distress. She has wheezes in the right lower field and the left lower field. She has no rales. She exhibits swelling. She exhibits no tenderness and no edema.  Musculoskeletal: She exhibits no edema.  Lymphadenopathy:    She has cervical adenopathy.  Neurological: She is alert and oriented to person, place, and time.  Skin: Ecchymosis noted.  Nursing note and vitals reviewed.    BP (!) 144/69   Pulse 96   Temp 98.7 F (37.1 C) (Oral)   Resp 16   Ht 5\' 6"  (1.676 m)   Wt 149 lb 9.6 oz (67.9 kg)   SpO2 99%   BMI 24.15 kg/m  Wt Readings from Last 3 Encounters:  10/07/17 149 lb 9.6 oz (67.9 kg)  07/21/17 150 lb (68 kg)  07/21/17 150 lb 6.4 oz (68.2 kg)     Lab Results  Component Value Date   WBC 7.2 07/21/2017   HGB 12.0 07/21/2017   HCT 33.8 (L) 07/21/2017   PLT 317.0 07/21/2017   GLUCOSE 108 (H) 07/21/2017   CHOL 239 (H) 11/26/2016   TRIG 106.0 11/26/2016     HDL 75.50 11/26/2016   LDLDIRECT 162.3 05/11/2012   LDLCALC 142 (H) 11/26/2016   ALT 12 07/21/2017   AST 15 07/21/2017   NA 123 (L) 07/21/2017   K 4.9 07/21/2017   CL 87 (L) 07/21/2017   CREATININE 0.68 07/21/2017   BUN 15 07/21/2017   CO2 29 07/21/2017   TSH 1.87 07/21/2017   INR 1.05 09/30/2009   HGBA1C 5.7 04/14/2007    No results found.   Assessment & Plan:  Plan  I am having Rodena Piety Rowser start on azithromycin, promethazine-dextromethorphan, and predniSONE. I am also having Toni [name] maintain Toni [name] cholecalciferol, Biotin, temazepam, Valerian, Magnesium Citrate, Calcium Citrate (CITRACAL PO), venlafaxine XR, ALPRAZolam, lamoTRIgine, levocetirizine, potassium chloride SA, fluticasone, tiZANidine, gabapentin, and losartan-hydrochlorothiazide. We will continue to administer sodium chloride.  Meds ordered this encounter  Medications  . azithromycin (ZITHROMAX Z-PAK) 250 MG tablet    Sig:  As directed    Dispense:  6 each    Refill:  0  . promethazine-dextromethorphan (PROMETHAZINE-DM) 6.25-15 MG/5ML syrup    Sig: Take 5 mLs by mouth 4 (four) times daily as needed.    Dispense:  118 mL    Refill:  0  . predniSONE (DELTASONE) 10 MG tablet    Sig: TAKE 3 TABLETS PO QD FOR 3 DAYS THEN TAKE 2 TABLETS PO QD FOR 3 DAYS THEN TAKE 1 TABLET PO QD FOR 3 DAYS THEN TAKE 1/2 TAB PO QD FOR 3 DAYS    Dispense:  20 tablet    Refill:  0    Problem List Items Addressed This Visit      Unprioritized   Acute left ankle pain - Primary    Rest, ice elevate, ace -- pt has scooter  Check xray  rto prn       Relevant Orders   DG Ankle Complete Left   Acute pansinusitis    abx per orders flonase Cough med  rto prn       Relevant Medications   azithromycin (ZITHROMAX Z-PAK) 250 MG tablet   promethazine-dextromethorphan (PROMETHAZINE-DM) 6.25-15 MG/5ML syrup   predniSONE (DELTASONE) 10 MG tablet   Bronchitis    abx per orders Cough med pred taper      Relevant Medications    azithromycin (ZITHROMAX Z-PAK) 250 MG tablet   promethazine-dextromethorphan (PROMETHAZINE-DM) 6.25-15 MG/5ML syrup   predniSONE (DELTASONE) 10 MG tablet    Other Visit Diagnoses    Cough       Relevant Orders   DG Chest 2 View   Hyponatremia       Relevant Orders   Basic metabolic panel      Follow-up: No follow-ups on file.  Ann Held, DO

## 2017-10-07 NOTE — Assessment & Plan Note (Signed)
abx per orders Cough med pred taper

## 2017-10-08 ENCOUNTER — Telehealth: Payer: Self-pay

## 2017-10-08 DIAGNOSIS — S8265XA Nondisplaced fracture of lateral malleolus of left fibula, initial encounter for closed fracture: Secondary | ICD-10-CM | POA: Diagnosis not present

## 2017-10-08 LAB — BASIC METABOLIC PANEL
BUN: 11 mg/dL (ref 6–23)
CO2: 26 mEq/L (ref 19–32)
Calcium: 10.1 mg/dL (ref 8.4–10.5)
Chloride: 92 mEq/L — ABNORMAL LOW (ref 96–112)
Creatinine, Ser: 0.78 mg/dL (ref 0.40–1.20)
GFR: 77.29 mL/min (ref >60.00)
Glucose, Bld: 143 mg/dL — ABNORMAL HIGH (ref 70–99)
POTASSIUM: 4.1 meq/L (ref 3.5–5.1)
Sodium: 128 mEq/L — ABNORMAL LOW (ref 135–145)

## 2017-10-08 NOTE — Telephone Encounter (Signed)
Copied from [place] [id number]. Topic: Referral - Request >> Oct 08, 2017  9:20 AM Oliver Pila B wrote: Reason for CRM: [facility] called b/c they are needing the OV notes sent over as well w/ the referral Contact: [phone]

## 2017-10-21 ENCOUNTER — Encounter: Payer: Self-pay | Admitting: Family Medicine

## 2017-10-22 DIAGNOSIS — S8265XD Nondisplaced fracture of lateral malleolus of left fibula, subsequent encounter for closed fracture with routine healing: Secondary | ICD-10-CM | POA: Diagnosis not present

## 2017-10-28 ENCOUNTER — Other Ambulatory Visit: Payer: Self-pay | Admitting: Family Medicine

## 2017-10-28 ENCOUNTER — Encounter: Payer: Self-pay | Admitting: Family Medicine

## 2017-10-28 DIAGNOSIS — D229 Melanocytic nevi, unspecified: Secondary | ICD-10-CM

## 2017-11-07 ENCOUNTER — Telehealth: Payer: Self-pay | Admitting: Family Medicine

## 2017-11-07 NOTE — Telephone Encounter (Signed)
Copied from [place] [id number]. Topic: Quick Communication - See Telephone Encounter >> Nov 07, 2017 11:09 AM Bea Graff, NT wrote: CRM for notification. See Telephone encounter for: 11/07/17. Pt states that the potassium chloride SA (K-DUR,KLOR-CON) 20 MEQ tablet are expensive for her. She states she called her insurance and they stated it would be less if the medication was changed to a generic brand. Please advise.

## 2017-11-11 NOTE — Telephone Encounter (Signed)
Patient notified that there is no other generic formulation for medication.  Per pharmacist med is $32 for 90day supply.  Advised patient that she can get just a 30 day supply and that may bring down cost.

## 2017-11-11 NOTE — Telephone Encounter (Signed)
Ok to change to generic

## 2017-11-12 DIAGNOSIS — S8265XD Nondisplaced fracture of lateral malleolus of left fibula, subsequent encounter for closed fracture with routine healing: Secondary | ICD-10-CM | POA: Diagnosis not present

## 2017-11-12 DIAGNOSIS — Z23 Encounter for immunization: Secondary | ICD-10-CM | POA: Diagnosis not present

## 2017-12-04 DIAGNOSIS — S8265XD Nondisplaced fracture of lateral malleolus of left fibula, subsequent encounter for closed fracture with routine healing: Secondary | ICD-10-CM | POA: Diagnosis not present

## 2017-12-08 DIAGNOSIS — L821 Other seborrheic keratosis: Secondary | ICD-10-CM | POA: Diagnosis not present

## 2017-12-19 ENCOUNTER — Other Ambulatory Visit: Payer: Self-pay | Admitting: Family Medicine

## 2017-12-30 ENCOUNTER — Other Ambulatory Visit (INDEPENDENT_AMBULATORY_CARE_PROVIDER_SITE_OTHER): Payer: Medicare Other

## 2017-12-30 ENCOUNTER — Encounter

## 2017-12-30 ENCOUNTER — Encounter: Payer: Self-pay | Admitting: Gastroenterology

## 2017-12-30 ENCOUNTER — Ambulatory Visit (INDEPENDENT_AMBULATORY_CARE_PROVIDER_SITE_OTHER): Payer: Medicare Other | Admitting: Gastroenterology

## 2017-12-30 VITALS — BP 130/70 | HR 104 | Ht 66.0 in | Wt 145.5 lb

## 2017-12-30 DIAGNOSIS — R197 Diarrhea, unspecified: Secondary | ICD-10-CM | POA: Diagnosis not present

## 2017-12-30 LAB — SEDIMENTATION RATE: Sed Rate: 31 mm/hr — ABNORMAL HIGH (ref 0–30)

## 2017-12-30 LAB — IGA: IgA: 80 mg/dL (ref 68–378)

## 2017-12-30 NOTE — Patient Instructions (Addendum)
Imodium one pill every morning.  You will have labs checked today in the basement lab.  Please head down after you check out with the front desk  (gi pathogen panel, ESR, tTG, total IgA).  Stop the probiotics and stop the fiber supplement.  Thank you for entrusting me with your care and choosing [facility].  Dr Ardis Hughs

## 2017-12-30 NOTE — Progress Notes (Signed)
Review of pertinent gastrointestinal problems: 1. Complicated diverticulitis, treated with sigmoid colectomy in 2001, sigmoid colostomy takedown November 2001. Post-surgery colonoscopy in 2002 was normal per patient. 2. Functional abdominal discomfort, dyspepsia, bloating. No weight loss, no anemia. No signs of GI bleeding. Thought possibly related to excessive alcohol intake (2007). Pt did not show for 2 month follow up from 07/2005 apt. 3. Reflux esophagitis: EGD Dr. Ardis Hughs 2010: showed ulcerative esophagitis. 4. ADenomatous polyps: Colonoscopy September 2017 Dr. Ardis Hughs for change in bowel habits found diverticulosis throughout her colon, a single 3 mm tubular adenoma was removed, she had internal and external hemorrhoids.  The examination was otherwise normal.    Stool testing July 2019 for C. difficile was negative, routine stool culture was also negative   HPI: This is a pleasant 72 yo woman whom I last saw about 2 years ago  For the past year she's had terrible loose stools.  She will have watery stools most days of the week.  Never nocturnal.  Gassy as well.    She's gained 4 pounds since her last visit here 2 years ago.  Was on antibiotics a few months ago for bronchitis.  Very little caffeine intake.  She's kept a food diarrhea, no obvious causes.  She has not tried imodium.  She did try a probiotic for the past 3 months with only brief improvement.  She's been taking imodium.  Chief complaint is loose, watery stools  ROS: complete GI ROS as described in HPI, all other review negative.  Constitutional:  No unintentional weight loss   Past Medical History:  Diagnosis Date  . Bipolar disorder ([place])    pt denies  . COPD (chronic obstructive pulmonary disease) ([place])   . Depression   . Diverticulitis   . Diverticulitis   . Fibromyalgia   . Gallstones 11/09/2015  . GERD (gastroesophageal reflux disease)   . Headache   . Hyperlipidemia   . Hypertension   .  Osteoarthritis     Past Surgical History:  Procedure Laterality Date  . ANKLE ARTHROSCOPY WITH OPEN REDUCTION INTERNAL FIXATION (ORIF) Right 06/20/2016   second surgery 06/28/2016 in [place]  . CHOLECYSTECTOMY N/A 12/04/2015   Procedure: LAPAROSCOPIC CHOLECYSTECTOMY WITH INTRAOPERATIVE CHOLANGIOGRAM;  Surgeon: Fanny Skates, MD;  Location: [facility];  Service: General;  Laterality: N/A;  . COLON RESECTION  2001   sigmoid colon removed  . COLOSTOMY CLOSURE  2011  . FEMUR FRACTURE SURGERY  09/2009  . JOINT REPLACEMENT Right 09/2009   R hip,  redone, 06/14/2010 [place],---  done secondary to fractured fractured x2  . TONSILLECTOMY AND ADENOIDECTOMY    . TOTAL HIP ARTHROPLASTY Left    1979 and 1986    Current Outpatient Medications  Medication Sig Dispense Refill  . ALPRAZolam (XANAX) 1 MG tablet TAKE 1/2 TABLET BY MOUTH THREE TIMES DAILY AS NEEDED--- dr Casimiro Needle to take over (Patient taking differently: Take 1 mg by mouth 3 (three) times daily as needed for anxiety. dr Casimiro Needle to take over) 30 tablet 0  . B Complex Vitamins (B COMPLEX PO) Take 1 tablet by mouth daily.    . Biotin 1000 MCG tablet Take 5,000 mcg by mouth daily.     . Calcium Citrate (CITRACAL PO) Take 1 tablet by mouth daily.    . cholecalciferol (VITAMIN D) 1000 UNITS tablet Take 1,000 Units by mouth daily.     . fluticasone (FLONASE) 50 MCG/ACT nasal spray Place 2 sprays into both nostrils daily. 16 g 6  . gabapentin (  NEURONTIN) 600 MG tablet TAKE 1 TABLET(600 MG) BY MOUTH THREE TIMES DAILY 270 tablet 1  . levocetirizine (XYZAL) 5 MG tablet Take 1 tablet (5 mg total) by mouth every evening. 30 tablet 5  . losartan-hydrochlorothiazide (HYZAAR) 100-25 MG tablet TAKE 1 TABLET BY MOUTH DAILY 90 tablet 0  . Magnesium Citrate 100 MG TABS Take 1 tablet by mouth 2 (two) times daily.     . temazepam (RESTORIL) 15 MG capsule Take 30 mg by mouth at bedtime.   1  . tiZANidine (ZANAFLEX) 4 MG tablet TAKE 1 TO 2 TABLETS(4 TO 8 MG) BY  MOUTH EVERY 6 HOURS AS NEEDED 30 tablet 0  . Valerian 450 MG CAPS Take 1 capsule by mouth every evening.     . venlafaxine XR (EFFEXOR-XR) 75 MG 24 hr capsule Take 225 mg by mouth daily.     . vitamin C (ASCORBIC ACID) 500 MG tablet Take 500 mg by mouth daily.    . vitamin E 400 UNIT capsule Take 400 Units by mouth daily.    . potassium chloride SA (K-DUR,KLOR-CON) 20 MEQ tablet Take 1 tablet (20 mEq total) by mouth once for 1 dose. 90 tablet 3   Current Facility-Administered Medications  Medication Dose Route Frequency Provider Last Rate Last Dose  . 0.9 %  sodium chloride infusion  500 mL Intravenous Continuous Milus Banister, MD        Allergies as of 12/30/2017 - Review Complete 12/30/2017  Allergen Reaction Noted  . Levofloxacin Rash     Family History  Problem Relation Age of Onset  . Stroke Mother   . Arthritis Unknown        mother family  . Depression Unknown        mother fanily  . Alcohol abuse Unknown        Both sides  . Cancer Unknown        mothers side  . Stroke Father   . Achalasia Father   . Colon cancer Neg Hx     Social History   Socioeconomic History  . Marital status: Single    Spouse name: Not on file  . Number of children: 1  . Years of education: Not on file  . Highest education level: Not on file  Occupational History  . Occupation: retired  Scientific laboratory technician  . Financial resource strain: Not on file  . Food insecurity:    Worry: Not on file    Inability: Not on file  . Transportation needs:    Medical: Not on file    Non-medical: Not on file  Tobacco Use  . Smoking status: Former Smoker    Last attempt to quit: 06/18/2011    Years since quitting: 6.5  . Smokeless tobacco: Never Used  . Tobacco comment: marijuana,  daily for years   Substance and Sexual Activity  . Alcohol use: Yes    Comment: 2-3 bottles of wine/week  . Drug use: Yes    Frequency: 14.0 times per week    Types: Marijuana    Comment: - daily use  . Sexual activity:  Not Currently    Partners: Male  Lifestyle  . Physical activity:    Days per week: Not on file    Minutes per session: Not on file  . Stress: Not on file  Relationships  . Social connections:    Talks on phone: Not on file    Gets together: Not on file    Attends religious service: Not  on file    Active member of club or organization: Not on file    Attends meetings of clubs or organizations: Not on file    Relationship status: Not on file  . Intimate partner violence:    Fear of current or ex partner: Not on file    Emotionally abused: Not on file    Physically abused: Not on file    Forced sexual activity: Not on file  Other Topics Concern  . Not on file  Social History Narrative   Lives alone in a one story home.  Retired from different jobs.  Education: college.  Has one child.      Physical Exam: BP 130/70 (BP Location: Left Arm, Patient Position: Sitting, Cuff Size: Normal)   Pulse (!) 104   Ht 5\' 6"  (1.676 m)   Wt 145 lb 8 oz (66 kg)   BMI 23.48 kg/m  Constitutional: generally well-appearing Psychiatric: alert and oriented x3 Abdomen: soft, nontender, nondistended, no obvious ascites, no peritoneal signs, normal bowel sounds No peripheral edema noted in lower extremities  Assessment and plan: 72 y.o. female with chronic diarrhea  She had colonoscopy 2 years ago and hopefully we'll not need to repeat that. I doubt infection given the time course but will double check with stool testing.  Labs also for sed rate and celiac sprue serologies.  SHe will start a single imodium on a daily scheduled basiss for now.  Pending the workup and her response to the imodium she may need further testing.  Please see the "Patient Instructions" section for addition details about the plan.  Owens Loffler, MD [facility] 12/30/2017, 3:49 PM

## 2017-12-31 LAB — TISSUE TRANSGLUTAMINASE, IGA: (TTG) AB, IGA: 1 U/mL

## 2018-01-02 ENCOUNTER — Telehealth: Payer: Self-pay | Admitting: Gastroenterology

## 2018-01-02 DIAGNOSIS — S8265XD Nondisplaced fracture of lateral malleolus of left fibula, subsequent encounter for closed fracture with routine healing: Secondary | ICD-10-CM | POA: Diagnosis not present

## 2018-01-02 NOTE — Telephone Encounter (Signed)
The pt was advised to stop imodium for now until she can complete stool studies.  She will then begin imodium as directed until results are back.

## 2018-01-02 NOTE — Telephone Encounter (Signed)
Patient has questions regarding medications. When should she stop taking imodium.

## 2018-01-07 DIAGNOSIS — M19011 Primary osteoarthritis, right shoulder: Secondary | ICD-10-CM | POA: Diagnosis not present

## 2018-01-07 DIAGNOSIS — G5603 Carpal tunnel syndrome, bilateral upper limbs: Secondary | ICD-10-CM | POA: Diagnosis not present

## 2018-01-07 DIAGNOSIS — M19012 Primary osteoarthritis, left shoulder: Secondary | ICD-10-CM | POA: Diagnosis not present

## 2018-01-13 ENCOUNTER — Encounter: Payer: Self-pay | Admitting: Neurology

## 2018-01-13 ENCOUNTER — Other Ambulatory Visit: Payer: Self-pay | Admitting: *Deleted

## 2018-01-13 DIAGNOSIS — G5603 Carpal tunnel syndrome, bilateral upper limbs: Secondary | ICD-10-CM

## 2018-02-10 ENCOUNTER — Ambulatory Visit (INDEPENDENT_AMBULATORY_CARE_PROVIDER_SITE_OTHER): Payer: Medicare Other | Admitting: Neurology

## 2018-02-10 ENCOUNTER — Encounter

## 2018-02-10 DIAGNOSIS — G5603 Carpal tunnel syndrome, bilateral upper limbs: Secondary | ICD-10-CM

## 2018-02-10 NOTE — Procedures (Signed)
[facility]  [address], [address]  [place], [place] [address] Tel: [phone] Fax:  [phone] Test Date:  02/10/2018  Patient: Toni [name] DOB: 1945/10/22 Physician: Narda Amber, DO  Sex: Female Height: 5\' 6"  Ref Phys: Almedia Balls, MD  ID#: [id number] Temp: 34.0C Technician:    Patient Complaints: This is a 73 year old female with known carpal tunnel syndrome referred for evaluation of worsening bilateral hand paresthesias and pain.  NCV & EMG Findings: Extensive electrodiagnostic testing of the right upper extremity and additional studies of the left shows:  1. Right median sensory response is absent.  Left median sensory response shows severely prolonged distal peak latency (6.9 ms) and reduced amplitude (7.5 V).  Bilateral ulnar sensory responses are within normal limits. 2. Bilateral median motor responses show severely prolonged distal onset latency (L7.9, R8.1 ms) and reduced amplitude (L2.5, R2.5 mV).  Bilateral ulnar motor responses are within normal limits.   3. Chronic motor axonal loss changes are isolated to bilateral abductor pollicis brevis muscles, without accompanied active denervation.    Impression: Bilateral median neuropathy at or distal to the wrist, consistent with a clinical diagnosis of carpal tunnel syndrome, which is severe in degree electrically and worse on the right. These findings have progressively worsened compared to study on 03/28/2014.   ___________________________ Narda Amber, DO    Nerve Conduction Studies Anti Sensory Summary Table   Site NR Peak (ms) Norm Peak (ms) P-T Amp (V) Norm P-T Amp  Left Median Anti Sensory (2nd Digit)  34C  Wrist    6.9 <3.8 7.5 >10  Right Median Anti Sensory (2nd Digit)  34C  Wrist NR  <3.8  >10  Left Ulnar Anti Sensory (5th Digit)  34C  Wrist    2.8 <3.2 26.5 >5  Right Ulnar Anti Sensory (5th Digit)  34C  Wrist    2.7 <3.2 25.2 >5   Motor Summary Table   Site NR Onset (ms) Norm  Onset (ms) O-P Amp (mV) Norm O-P Amp Site1 Site2 Delta-0 (ms) Dist (cm) Vel (m/s) Norm Vel (m/s)  Left Median Motor (Abd Poll Brev)  34C  Wrist    7.9 <4.0 2.5 >5 Elbow Wrist 5.8 30.0 52 >50  Elbow    13.7  2.2         Right Median Motor (Abd Poll Brev)  34C  Wrist    8.1 <4.0 2.5 >5 Elbow Wrist 5.3 27.0 51 >50  Elbow    13.4  2.4         Left Ulnar Motor (Abd Dig Minimi)  34C  Wrist    2.4 <3.1 11.8 >7 B Elbow Wrist 3.9 23.0 59 >50  B Elbow    6.3  11.0  A Elbow B Elbow 1.9 10.0 53 >50  A Elbow    8.2  10.2         Right Ulnar Motor (Abd Dig Minimi)  34C  Wrist    2.6 <3.1 11.7 >7 B Elbow Wrist 3.7 22.0 59 >50  B Elbow    6.3  11.2  A Elbow B Elbow 1.9 10.0 53 >50  A Elbow    8.2  10.8          EMG   Side Muscle Ins Act Fibs Psw Fasc Number Recrt Dur Dur. Amp Amp. Poly Poly. Comment  Right 1stDorInt Nml Nml Nml Nml Nml Nml Nml Nml Nml Nml Nml Nml N/A  Right Abd Poll Brev Nml Nml Nml Nml 3- Rapid  Most 1+ Many 1+ Many 1+ N/A  Right PronatorTeres Nml Nml Nml Nml Nml Nml Nml Nml Nml Nml Nml Nml N/A  Right Biceps Nml Nml Nml Nml Nml Nml Nml Nml Nml Nml Nml Nml N/A  Right Triceps Nml Nml Nml Nml Nml Nml Nml Nml Nml Nml Nml Nml N/A  Right Deltoid Nml Nml Nml Nml Nml Nml Nml Nml Nml Nml Nml Nml N/A  Left 1stDorInt Nml Nml Nml Nml Nml Nml Nml Nml Nml Nml Nml Nml N/A  Left Abd Poll Brev Nml Nml Nml Nml 3- Rapid Many 1+ Many 1+ Many 1+ N/A  Left PronatorTeres Nml Nml Nml Nml Nml Nml Nml Nml Nml Nml Nml Nml N/A  Left Biceps Nml Nml Nml Nml Nml Nml Nml Nml Nml Nml Nml Nml N/A  Left Triceps Nml Nml Nml Nml Nml Nml Nml Nml Nml Nml Nml Nml N/A  Left Deltoid Nml Nml Nml Nml Nml Nml Nml Nml Nml Nml Nml Nml N/A      Waveforms:

## 2018-02-23 DIAGNOSIS — G5603 Carpal tunnel syndrome, bilateral upper limbs: Secondary | ICD-10-CM | POA: Diagnosis not present

## 2018-04-03 ENCOUNTER — Encounter: Payer: Self-pay | Admitting: Family Medicine

## 2018-04-03 ENCOUNTER — Ambulatory Visit (INDEPENDENT_AMBULATORY_CARE_PROVIDER_SITE_OTHER): Payer: Medicare Other | Admitting: Family Medicine

## 2018-04-03 VITALS — BP 129/65 | HR 105 | Temp 98.6°F | Resp 14 | Ht 66.0 in | Wt 147.0 lb

## 2018-04-03 DIAGNOSIS — F419 Anxiety disorder, unspecified: Secondary | ICD-10-CM

## 2018-04-03 DIAGNOSIS — J40 Bronchitis, not specified as acute or chronic: Secondary | ICD-10-CM | POA: Diagnosis not present

## 2018-04-03 DIAGNOSIS — M62838 Other muscle spasm: Secondary | ICD-10-CM | POA: Diagnosis not present

## 2018-04-03 MED ORDER — PREDNISONE 10 MG PO TABS: ORAL_TABLET | ORAL | 0 refills | Status: DC

## 2018-04-03 MED ORDER — BENZONATATE 200 MG PO CAPS: 200.0000 mg | ORAL_CAPSULE | Freq: Two times a day (BID) | ORAL | 0 refills | Status: DC | PRN

## 2018-04-03 MED ORDER — ALBUTEROL SULFATE (2.5 MG/3ML) 0.083% IN NEBU
2.5000 mg | INHALATION_SOLUTION | Freq: Once | RESPIRATORY_TRACT | Status: AC
  Administered 2018-04-03: 2.5 mg via RESPIRATORY_TRACT

## 2018-04-03 MED ORDER — ALBUTEROL SULFATE HFA 108 (90 BASE) MCG/ACT IN AERS: 2.0000 | INHALATION_SPRAY | Freq: Four times a day (QID) | RESPIRATORY_TRACT | 0 refills | Status: DC | PRN

## 2018-04-03 MED ORDER — TIZANIDINE HCL 4 MG PO TABS: ORAL_TABLET | ORAL | 0 refills | Status: DC

## 2018-04-03 MED ORDER — AZITHROMYCIN 250 MG PO TABS: ORAL_TABLET | ORAL | 0 refills | Status: DC

## 2018-04-03 NOTE — Progress Notes (Signed)
Patient ID: Toni [name], female    DOB: [birth year]  Age: 73 y.o. MRN: [id number]    Subjective:  Subjective  HPI Toni [name] presents for sinus congestion and cough x few weeks .  She did get better after last ov but within 3-4 weeks she started to get sick again.     Review of Systems  Constitutional: Negative for appetite change, chills, diaphoresis, fatigue, fever and unexpected weight change.  HENT: Positive for congestion, postnasal drip, rhinorrhea and sinus pressure.   Eyes: Negative for pain, redness and visual disturbance.  Respiratory: Positive for cough and chest tightness. Negative for shortness of breath and wheezing.   Cardiovascular: Negative for chest pain, palpitations and leg swelling.  Endocrine: Negative for cold intolerance, heat intolerance, polydipsia, polyphagia and polyuria.  Genitourinary: Negative for difficulty urinating, dysuria and frequency.  Allergic/Immunologic: Negative for environmental allergies.  Neurological: Negative for dizziness, light-headedness, numbness and headaches.    History Past Medical History:  Diagnosis Date  . Bipolar disorder ([place])    pt denies  . COPD (chronic obstructive pulmonary disease) ([place])   . Depression   . Diverticulitis   . Diverticulitis   . Fibromyalgia   . Gallstones 11/09/2015  . GERD (gastroesophageal reflux disease)   . Headache   . Hyperlipidemia   . Hypertension   . Osteoarthritis     She has a past surgical history that includes Tonsillectomy and adenoidectomy; Total hip arthroplasty (Left); Femur fracture surgery (09/2009); Joint replacement (Right, 09/2009); Colon resection (2001); Colostomy closure (2011); Cholecystectomy (N/A, 12/04/2015); and Ankle arthroscopy with open reduction internal fixation (orif) (Right, 06/20/2016).   Her family history includes Achalasia in her father; Alcohol abuse in her unknown relative; Arthritis in her unknown relative; Cancer in her unknown relative; Depression in  her unknown relative; Stroke in her father and mother.She reports that she quit smoking about 6 years ago. She has never used smokeless tobacco. She reports current alcohol use. She reports current drug use. Frequency: 14.00 times per week. Drug: Marijuana.  Current Outpatient Medications on File Prior to Visit  Medication Sig Dispense Refill  . ALPRAZolam (XANAX) 1 MG tablet TAKE 1/2 TABLET BY MOUTH THREE TIMES DAILY AS NEEDED--- dr Casimiro Needle to take over (Patient taking differently: Take 1 mg by mouth 3 (three) times daily as needed for anxiety. dr Casimiro Needle to take over) 30 tablet 0  . B Complex Vitamins (B COMPLEX PO) Take 1 tablet by mouth daily.    . Biotin 1000 MCG tablet Take 5,000 mcg by mouth daily.     . Calcium Citrate (CITRACAL PO) Take 1 tablet by mouth daily.    . cholecalciferol (VITAMIN D) 1000 UNITS tablet Take 1,000 Units by mouth daily.     . fluticasone (FLONASE) 50 MCG/ACT nasal spray Place 2 sprays into both nostrils daily. 16 g 6  . gabapentin (NEURONTIN) 600 MG tablet TAKE 1 TABLET(600 MG) BY MOUTH THREE TIMES DAILY 270 tablet 1  . levocetirizine (XYZAL) 5 MG tablet Take 1 tablet (5 mg total) by mouth every evening. 30 tablet 5  . losartan-hydrochlorothiazide (HYZAAR) 100-25 MG tablet TAKE 1 TABLET BY MOUTH DAILY 90 tablet 0  . Magnesium Citrate 100 MG TABS Take 1 tablet by mouth 2 (two) times daily.     . temazepam (RESTORIL) 15 MG capsule Take 30 mg by mouth at bedtime.   1  . Valerian 450 MG CAPS Take 1 capsule by mouth every evening.     . venlafaxine XR (EFFEXOR-XR) 75 MG  24 hr capsule Take 225 mg by mouth daily.     . vitamin C (ASCORBIC ACID) 500 MG tablet Take 500 mg by mouth daily.    . vitamin E 400 UNIT capsule Take 400 Units by mouth daily.    . potassium chloride SA (K-DUR,KLOR-CON) 20 MEQ tablet Take 1 tablet (20 mEq total) by mouth once for 1 dose. 90 tablet 3   Current Facility-Administered Medications on File Prior to Visit  Medication Dose Route Frequency  Provider Last Rate Last Dose  . 0.9 %  sodium chloride infusion  500 mL Intravenous Continuous Milus Banister, MD         Objective:  Objective  Physical Exam Vitals signs and nursing note reviewed.  Constitutional:      Appearance: She is well-developed.  HENT:     Right Ear: External ear normal.     Left Ear: External ear normal.  Eyes:     General:        Right eye: No discharge.        Left eye: No discharge.     Conjunctiva/sclera: Conjunctivae normal.  Cardiovascular:     Rate and Rhythm: Normal rate and regular rhythm.     Heart sounds: Normal heart sounds. No murmur.  Pulmonary:     Effort: Pulmonary effort is normal. No respiratory distress.     Breath sounds: Wheezing present. No rales.  Chest:     Chest wall: No tenderness.  Lymphadenopathy:     Cervical: Cervical adenopathy present.  Neurological:     Mental Status: She is alert and oriented to person, place, and time.    BP 129/65 (BP Location: Left Arm, Patient Position: Sitting, Cuff Size: Normal)   Pulse (!) 105   Temp 98.6 F (37 C)   Resp 14   Ht 5\' 6"  (1.676 m)   Wt 147 lb (66.7 kg)   SpO2 98%   BMI 23.73 kg/m  Wt Readings from Last 3 Encounters:  04/03/18 147 lb (66.7 kg)  12/30/17 145 lb 8 oz (66 kg)  10/07/17 149 lb 9.6 oz (67.9 kg)     Lab Results  Component Value Date   WBC 7.2 07/21/2017   HGB 12.0 07/21/2017   HCT 33.8 (L) 07/21/2017   PLT 317.0 07/21/2017   GLUCOSE 143 (H) 10/07/2017   CHOL 239 (H) 11/26/2016   TRIG 106.0 11/26/2016   HDL 75.50 11/26/2016   LDLDIRECT 162.3 05/11/2012   LDLCALC 142 (H) 11/26/2016   ALT 12 07/21/2017   AST 15 07/21/2017   NA 128 (L) 10/07/2017   K 4.1 10/07/2017   CL 92 (L) 10/07/2017   CREATININE 0.78 10/07/2017   BUN 11 10/07/2017   CO2 26 10/07/2017   TSH 1.87 07/21/2017   INR 1.05 09/30/2009   HGBA1C 5.7 04/14/2007    Dg Chest 2 View  Result Date: 10/07/2017 CLINICAL DATA:  Shortness of breath. EXAM: CHEST - 2 VIEW COMPARISON:   Radiographs of December 17, 2012. FINDINGS: The heart size and mediastinal contours are within normal limits. Both lungs are clear. No pneumothorax or pleural effusion is noted. The visualized skeletal structures are unremarkable. IMPRESSION: No active cardiopulmonary disease. Electronically Signed   By: Marijo Conception, M.D.   On: 10/07/2017 15:49   Dg Ankle Complete Left  Result Date: 10/07/2017 CLINICAL DATA:  Left ankle pain after injury 2 days ago. EXAM: LEFT ANKLE COMPLETE - 3+ VIEW COMPARISON:  Radiographs of January 07, 2008. FINDINGS: Mildly displaced  oblique fracture is seen involving the distal left fibula. Joint spaces appear intact. No soft tissue abnormality is noted. IMPRESSION: Mildly displaced distal left fibular fracture. Electronically Signed   By: Marijo Conception, M.D.   On: 10/07/2017 15:48     Assessment & Plan:  Plan  I am having Toni [name] start on azithromycin, predniSONE, benzonatate, and albuterol. I am also having her maintain her cholecalciferol, Biotin, temazepam, Valerian, Magnesium Citrate, Calcium Citrate (CITRACAL PO), venlafaxine XR, ALPRAZolam, levocetirizine, potassium chloride SA, fluticasone, gabapentin, losartan-hydrochlorothiazide, vitamin C, vitamin E, B Complex Vitamins (B COMPLEX PO), and tiZANidine. We administered albuterol. We will continue to administer sodium chloride.  Meds ordered this encounter  Medications  . azithromycin (ZITHROMAX Z-PAK) 250 MG tablet    Sig: As directed    Dispense:  6 each    Refill:  0  . predniSONE (DELTASONE) 10 MG tablet    Sig: TAKE 3 TABLETS PO QD FOR 3 DAYS THEN TAKE 2 TABLETS PO QD FOR 3 DAYS THEN TAKE 1 TABLET PO QD FOR 3 DAYS THEN TAKE 1/2 TAB PO QD FOR 3 DAYS    Dispense:  20 tablet    Refill:  0  . tiZANidine (ZANAFLEX) 4 MG tablet    Sig: TAKE 1 TO 2 TABLETS(4 TO 8 MG) BY MOUTH EVERY 6 HOURS AS NEEDED    Dispense:  30 tablet    Refill:  0  . benzonatate (TESSALON) 200 MG capsule    Sig: Take 1 capsule  (200 mg total) by mouth 2 (two) times daily as needed for cough.    Dispense:  20 capsule    Refill:  0  . albuterol (PROVENTIL HFA;VENTOLIN HFA) 108 (90 Base) MCG/ACT inhaler    Sig: Inhale 2 puffs into the lungs every 6 (six) hours as needed for wheezing or shortness of breath.    Dispense:  1 Inhaler    Refill:  0  . albuterol (PROVENTIL) (2.5 MG/3ML) 0.083% nebulizer solution 2.5 mg    Problem List Items Addressed This Visit      Unprioritized   Bronchitis - Primary   Relevant Medications   azithromycin (ZITHROMAX Z-PAK) 250 MG tablet   predniSONE (DELTASONE) 10 MG tablet   benzonatate (TESSALON) 200 MG capsule   albuterol (PROVENTIL HFA;VENTOLIN HFA) 108 (90 Base) MCG/ACT inhaler   albuterol (PROVENTIL) (2.5 MG/3ML) 0.083% nebulizer solution 2.5 mg (Completed)    Other Visit Diagnoses    Muscle spasm       Relevant Medications   tiZANidine (ZANAFLEX) 4 MG tablet   Anxiety          Follow-up: Return if symptoms worsen or fail to improve.  Ann Held, DO

## 2018-04-03 NOTE — Patient Instructions (Signed)

## 2018-04-09 ENCOUNTER — Other Ambulatory Visit: Payer: Self-pay | Admitting: Family Medicine

## 2018-04-26 ENCOUNTER — Other Ambulatory Visit: Payer: Self-pay | Admitting: Family Medicine

## 2018-04-26 DIAGNOSIS — J40 Bronchitis, not specified as acute or chronic: Secondary | ICD-10-CM

## 2018-06-17 ENCOUNTER — Other Ambulatory Visit: Payer: Self-pay | Admitting: Family Medicine

## 2018-06-17 DIAGNOSIS — E876 Hypokalemia: Secondary | ICD-10-CM

## 2018-06-30 DIAGNOSIS — F331 Major depressive disorder, recurrent, moderate: Secondary | ICD-10-CM | POA: Diagnosis not present

## 2018-07-07 DIAGNOSIS — F3341 Major depressive disorder, recurrent, in partial remission: Secondary | ICD-10-CM | POA: Diagnosis not present

## 2018-07-07 DIAGNOSIS — F419 Anxiety disorder, unspecified: Secondary | ICD-10-CM | POA: Diagnosis not present

## 2018-07-14 DIAGNOSIS — F419 Anxiety disorder, unspecified: Secondary | ICD-10-CM | POA: Diagnosis not present

## 2018-07-14 DIAGNOSIS — F331 Major depressive disorder, recurrent, moderate: Secondary | ICD-10-CM | POA: Diagnosis not present

## 2018-08-25 DIAGNOSIS — F331 Major depressive disorder, recurrent, moderate: Secondary | ICD-10-CM | POA: Diagnosis not present

## 2018-08-25 DIAGNOSIS — F419 Anxiety disorder, unspecified: Secondary | ICD-10-CM | POA: Diagnosis not present

## 2018-08-27 ENCOUNTER — Other Ambulatory Visit: Payer: Self-pay

## 2018-09-01 DIAGNOSIS — F331 Major depressive disorder, recurrent, moderate: Secondary | ICD-10-CM | POA: Diagnosis not present

## 2018-09-10 DIAGNOSIS — F419 Anxiety disorder, unspecified: Secondary | ICD-10-CM | POA: Diagnosis not present

## 2018-09-10 DIAGNOSIS — F3341 Major depressive disorder, recurrent, in partial remission: Secondary | ICD-10-CM | POA: Diagnosis not present

## 2018-09-17 DIAGNOSIS — F419 Anxiety disorder, unspecified: Secondary | ICD-10-CM | POA: Diagnosis not present

## 2018-09-17 DIAGNOSIS — F331 Major depressive disorder, recurrent, moderate: Secondary | ICD-10-CM | POA: Diagnosis not present

## 2018-10-01 DIAGNOSIS — F331 Major depressive disorder, recurrent, moderate: Secondary | ICD-10-CM | POA: Diagnosis not present

## 2018-10-15 DIAGNOSIS — F331 Major depressive disorder, recurrent, moderate: Secondary | ICD-10-CM | POA: Diagnosis not present

## 2018-10-19 ENCOUNTER — Other Ambulatory Visit: Payer: Self-pay | Admitting: *Deleted

## 2018-10-19 DIAGNOSIS — E876 Hypokalemia: Secondary | ICD-10-CM

## 2018-10-19 MED ORDER — POTASSIUM CHLORIDE CRYS ER 20 MEQ PO TBCR: EXTENDED_RELEASE_TABLET | ORAL | 0 refills | Status: DC

## 2018-10-19 MED ORDER — LOSARTAN POTASSIUM-HCTZ 100-25 MG PO TABS: 1.0000 | ORAL_TABLET | Freq: Every day | ORAL | 0 refills | Status: DC

## 2018-10-22 NOTE — Progress Notes (Deleted)
Virtual Visit via Video Note  I connected with patient on 10/23/18 at  1:00 PM EDT by audio enabled telemedicine application and verified that I am speaking with the correct person using two identifiers.   THIS ENCOUNTER IS A VIRTUAL VISIT DUE TO COVID-19 - PATIENT WAS NOT SEEN IN THE OFFICE. PATIENT HAS CONSENTED TO VIRTUAL VISIT / TELEMEDICINE VISIT   Location of patient: home  Location of provider: office  I discussed the limitations of evaluation and management by telemedicine and the availability of in person appointments. The patient expressed understanding and agreed to proceed.   Subjective:   Toni [name] is a 73 y.o. female who presents for Medicare Annual (Subsequent) preventive examination.  Review of Systems:    Home Safety/Smoke Alarms: Feels safe in home. Smoke alarms in place.  Lives alone with cat.  Female:     Mammo-       Dexa scan-        CCS- last 10/06/15. 5 yr recall     Objective:     Vitals: There were no vitals taken for this visit.  There is no height or weight on file to calculate BMI.  Advanced Directives 07/21/2017 06/07/2017 04/01/2016 11/29/2015  Does Patient Have a Medical Advance Directive? Yes No Yes No  Type of Paramedic of [place];Living will - [place];Living will -  Copy of [place] in Chart? Yes - Yes -  Would patient like information on creating a medical advance directive? - Yes (ED - Information included in AVS) - Yes - Educational materials given    Tobacco Social History   Tobacco Use  Smoking Status Former Smoker  . Quit date: 06/18/2011  . Years since quitting: 7.3  Smokeless Tobacco Never Used  Tobacco Comment   marijuana,  daily for years      Counseling given: Not Answered Comment: marijuana,  daily for years    Clinical Intake:                       Past Medical History:  Diagnosis Date  . Bipolar disorder ([place])    pt denies  . COPD  (chronic obstructive pulmonary disease) ([place])   . Depression   . Diverticulitis   . Diverticulitis   . Fibromyalgia   . Gallstones 11/09/2015  . GERD (gastroesophageal reflux disease)   . Headache   . Hyperlipidemia   . Hypertension   . Osteoarthritis    Past Surgical History:  Procedure Laterality Date  . ANKLE ARTHROSCOPY WITH OPEN REDUCTION INTERNAL FIXATION (ORIF) Right 06/20/2016   second surgery 06/28/2016 in [place]  . CHOLECYSTECTOMY N/A 12/04/2015   Procedure: LAPAROSCOPIC CHOLECYSTECTOMY WITH INTRAOPERATIVE CHOLANGIOGRAM;  Surgeon: Fanny Skates, MD;  Location: [facility];  Service: General;  Laterality: N/A;  . COLON RESECTION  2001   sigmoid colon removed  . COLOSTOMY CLOSURE  2011  . FEMUR FRACTURE SURGERY  09/2009  . JOINT REPLACEMENT Right 09/2009   R hip,  redone, 06/14/2010 [place],---  done secondary to fractured fractured x2  . TONSILLECTOMY AND ADENOIDECTOMY    . TOTAL HIP ARTHROPLASTY Left    1979 and 1986   Family History  Problem Relation Age of Onset  . Stroke Mother   . Arthritis Unknown        mother family  . Depression Unknown        mother fanily  . Alcohol abuse Unknown  Both sides  . Cancer Unknown        mothers side  . Stroke Father   . Achalasia Father   . Colon cancer Neg Hx    Social History   Socioeconomic History  . Marital status: Single    Spouse name: Not on file  . Number of children: 1  . Years of education: Not on file  . Highest education level: Not on file  Occupational History  . Occupation: retired  Scientific laboratory technician  . Financial resource strain: Not on file  . Food insecurity    Worry: Not on file    Inability: Not on file  . Transportation needs    Medical: Not on file    Non-medical: Not on file  Tobacco Use  . Smoking status: Former Smoker    Quit date: 06/18/2011    Years since quitting: 7.3  . Smokeless tobacco: Never Used  . Tobacco comment: marijuana,  daily for years   Substance and Sexual Activity   . Alcohol use: Yes    Comment: 2-3 bottles of wine/week  . Drug use: Yes    Frequency: 14.0 times per week    Types: Marijuana    Comment: - daily use  . Sexual activity: Not Currently    Partners: Male  Lifestyle  . Physical activity    Days per week: Not on file    Minutes per session: Not on file  . Stress: Not on file  Relationships  . Social Herbalist on phone: Not on file    Gets together: Not on file    Attends religious service: Not on file    Active member of club or organization: Not on file    Attends meetings of clubs or organizations: Not on file    Relationship status: Not on file  Other Topics Concern  . Not on file  Social History Narrative   Lives alone in a one story home.  Retired from different jobs.  Education: college.  Has one child.     Outpatient Encounter Medications as of 10/23/2018  Medication Sig  . ALPRAZolam (XANAX) 1 MG tablet TAKE 1/2 TABLET BY MOUTH THREE TIMES DAILY AS NEEDED--- dr Casimiro Needle to take over (Patient taking differently: Take 1 mg by mouth 3 (three) times daily as needed for anxiety. dr Casimiro Needle to take over)  . azithromycin (ZITHROMAX Z-PAK) 250 MG tablet As directed  . B Complex Vitamins (B COMPLEX PO) Take 1 tablet by mouth daily.  . benzonatate (TESSALON) 200 MG capsule Take 1 capsule (200 mg total) by mouth 2 (two) times daily as needed for cough.  . Biotin 1000 MCG tablet Take 5,000 mcg by mouth daily.   . Calcium Citrate (CITRACAL PO) Take 1 tablet by mouth daily.  . cholecalciferol (VITAMIN D) 1000 UNITS tablet Take 1,000 Units by mouth daily.   . fluticasone (FLONASE) 50 MCG/ACT nasal spray Place 2 sprays into both nostrils daily.  Marland Kitchen gabapentin (NEURONTIN) 600 MG tablet TAKE 1 TABLET(600 MG) BY MOUTH THREE TIMES DAILY  . levocetirizine (XYZAL) 5 MG tablet Take 1 tablet (5 mg total) by mouth every evening.  Marland Kitchen losartan-hydrochlorothiazide (HYZAAR) 100-25 MG tablet Take 1 tablet by mouth daily. NEEDS OV  . Magnesium  Citrate 100 MG TABS Take 1 tablet by mouth 2 (two) times daily.   . potassium chloride SA (K-DUR) 20 MEQ tablet TAKE 1 TABLET(20 MEQ) BY MOUTH EVERY DAY.  NEEDS OV  . predniSONE (DELTASONE) 10 MG  tablet TAKE 3 TABLETS PO QD FOR 3 DAYS THEN TAKE 2 TABLETS PO QD FOR 3 DAYS THEN TAKE 1 TABLET PO QD FOR 3 DAYS THEN TAKE 1/2 TAB PO QD FOR 3 DAYS  . temazepam (RESTORIL) 15 MG capsule Take 30 mg by mouth at bedtime.   Marland Kitchen tiZANidine (ZANAFLEX) 4 MG tablet TAKE 1 TO 2 TABLETS(4 TO 8 MG) BY MOUTH EVERY 6 HOURS AS NEEDED  . Valerian 450 MG CAPS Take 1 capsule by mouth every evening.   . venlafaxine XR (EFFEXOR-XR) 75 MG 24 hr capsule Take 225 mg by mouth daily.   . VENTOLIN HFA 108 (90 Base) MCG/ACT inhaler INHALE 2 PUFFS INTO THE LUNGS EVERY 6 HOURS AS NEEDED FOR WHEEZING OR SHORTNESS OF BREATH  . vitamin C (ASCORBIC ACID) 500 MG tablet Take 500 mg by mouth daily.  . vitamin E 400 UNIT capsule Take 400 Units by mouth daily.   Facility-Administered Encounter Medications as of 10/23/2018  Medication  . 0.9 %  sodium chloride infusion    Activities of Daily Living No flowsheet data found.  Patient Care Team: Carollee Herter, Alferd Apa, DO as PCP - General Norma Fredrickson, MD as Consulting Physician (Psychiatry) Norma Fredrickson, MD as Consulting Physician (Psychiatry) Ralene Bathe, MD as Consulting Physician (Ophthalmology) Milus Banister, MD as Consulting Physician (Gastroenterology)    Assessment:   This is a routine wellness examination for Toni [name]. Physical assessment deferred to PCP.  Exercise Activities and Dietary recommendations   Diet (meal preparation, eat out, water intake, caffeinated beverages, dairy products, fruits and vegetables): {Desc; diets:16563} Breakfast: Lunch:  Dinner:      Goals    . Continue with counseling    . Keep specialist appointments       Fall Risk Fall Risk  07/21/2017 11/26/2016 04/01/2016 08/08/2014 07/22/2013  Falls in the past year? No Yes Yes No No   Number falls in past yr: - 1 1 - -  Injury with Fall? - Yes No - -  Risk for fall due to : - - History of fall(s) - -  Risk for fall due to: Comment - - - - -    Depression Screen PHQ 2/9 Scores 07/21/2017 11/26/2016 04/01/2016 08/08/2014  PHQ - 2 Score 2 2 6 1   PHQ- 9 Score 8 11 15  -     Cognitive Function Ad8 score reviewed for issues:  Issues making decisions:  Less interest in hobbies / activities:  Repeats questions, stories (family complaining):  Trouble using ordinary gadgets (microwave, computer, phone):  Forgets the month or year:   Mismanaging finances:   Remembering appts:  Daily problems with thinking and/or memory: Ad8 score is=   MMSE - Mini Mental State Exam 07/21/2017 04/01/2016  Not completed: Refused -  Orientation to time - 5  Orientation to Place - 5  Registration - 3  Attention/ Calculation - 5  Recall - 2  Language- name 2 objects - 2  Language- repeat - 1  Language- follow 3 step command - 3  Language- read & follow direction - 1  Write a sentence - 1  Copy design - 1  Total score - 29        Immunization History  Administered Date(s) Administered  . Influenza Whole 11/09/2008, 11/29/2010  . Influenza, High Dose Seasonal PF 10/04/2016  . Influenza-Unspecified 10/29/2015, 11/12/2017  . Pneumococcal Conjugate-13 08/08/2014  . Pneumococcal Polysaccharide-23 05/11/2012   Screening Tests Health Maintenance  Topic Date Due  . TETANUS/TDAP  05/18/1965  .  MAMMOGRAM  05/18/2018  . INFLUENZA VACCINE  08/29/2018  . COLONOSCOPY  10/05/2020  . DEXA SCAN  Completed  . Hepatitis C Screening  Completed  . PNA vac Low Risk Adult  Completed      Plan:   ***   I have personally reviewed and noted the following in the patient's chart:   . Medical and social history . Use of alcohol, tobacco or illicit drugs  . Current medications and supplements . Functional ability and status . Nutritional status . Physical activity . Advanced directives  . List of other physicians . Hospitalizations, surgeries, and ER visits in previous 12 months . Vitals . Screenings to include cognitive, depression, and falls . Referrals and appointments  In addition, I have reviewed and discussed with patient certain preventive protocols, quality metrics, and best practice recommendations. A written personalized care plan for preventive services as well as general preventive health recommendations were provided to patient.     Naaman Plummer [place], [place]  10/22/2018

## 2018-10-23 ENCOUNTER — Ambulatory Visit: Payer: Medicare Other | Admitting: *Deleted

## 2018-10-23 ENCOUNTER — Other Ambulatory Visit: Payer: Self-pay

## 2018-10-29 DIAGNOSIS — F331 Major depressive disorder, recurrent, moderate: Secondary | ICD-10-CM | POA: Diagnosis not present

## 2018-10-29 NOTE — Progress Notes (Signed)
Virtual Visit via Video Note  I connected with patient on 10/30/18 at  2:30 PM EDT by audio enabled telemedicine application and verified that I am speaking with the correct person using two identifiers.   THIS ENCOUNTER IS A VIRTUAL VISIT DUE TO COVID-19 - PATIENT WAS NOT SEEN IN THE OFFICE. PATIENT HAS CONSENTED TO VIRTUAL VISIT / TELEMEDICINE VISIT   Location of patient: home  Location of provider: office  I discussed the limitations of evaluation and management by telemedicine and the availability of in person appointments. The patient expressed understanding and agreed to proceed.   Subjective:   Toni [name] is a 73 y.o. female who presents for Medicare Annual (Subsequent) preventive examination.  Review of Systems:  Cardiac Risk Factors include: advanced age (>93men, >67 women);dyslipidemia Home Safety/Smoke Alarms: Feels safe in home. Smoke alarms in place.  Lives alone w/ cat. 1 story house.   Female:        Mammo- declines at this time due to Covid   Dexa scan-  declines at this time due to Covid        CCS-last 10/06/15. 5 yr recall.     Objective:     Vitals: Unable to assess. This visit is enabled though telemedicine due to Covid 19.   Advanced Directives 10/30/2018 07/21/2017 06/07/2017 04/01/2016 11/29/2015  Does Patient Have a Medical Advance Directive? Yes Yes No Yes No  Type of Paramedic of [place];Living will [place];Living will - [place];Living will -  Does patient want to make changes to medical advance directive? No - Patient declined - - - -  Copy of [place] in Chart? Yes - validated most recent copy scanned in chart (See row information) Yes - Yes -  Would patient like information on creating a medical advance directive? - - Yes (ED - Information included in AVS) - Yes - Educational materials given    Tobacco Social History   Tobacco Use  Smoking Status Former  Smoker  . Quit date: 06/18/2011  . Years since quitting: 7.3  Smokeless Tobacco Never Used  Tobacco Comment   marijuana,  daily for years      Counseling given: Not Answered Comment: marijuana,  daily for years    Clinical Intake:  Pain : No/denies pain    Past Medical History:  Diagnosis Date  . Bipolar disorder ([place])    pt denies  . COPD (chronic obstructive pulmonary disease) ([place])   . Depression   . Diverticulitis   . Diverticulitis   . Fibromyalgia   . Gallstones 11/09/2015  . GERD (gastroesophageal reflux disease)   . Headache   . Hyperlipidemia   . Hypertension   . Osteoarthritis    Past Surgical History:  Procedure Laterality Date  . ANKLE ARTHROSCOPY WITH OPEN REDUCTION INTERNAL FIXATION (ORIF) Right 06/20/2016   second surgery 06/28/2016 in [place]  . CHOLECYSTECTOMY N/A 12/04/2015   Procedure: LAPAROSCOPIC CHOLECYSTECTOMY WITH INTRAOPERATIVE CHOLANGIOGRAM;  Surgeon: Fanny Skates, MD;  Location: [facility];  Service: General;  Laterality: N/A;  . COLON RESECTION  2001   sigmoid colon removed  . COLOSTOMY CLOSURE  2011  . FEMUR FRACTURE SURGERY  09/2009  . JOINT REPLACEMENT Right 09/2009   R hip,  redone, 06/14/2010 [place],---  done secondary to fractured fractured x2  . TONSILLECTOMY AND ADENOIDECTOMY    . TOTAL HIP ARTHROPLASTY Left    1979 and 1986   Family History  Problem Relation Age of  Onset  . Stroke Mother   . Arthritis Other        mother family  . Depression Other        mother fanily  . Alcohol abuse Other        Both sides  . Cancer Other        mothers side  . Stroke Father   . Achalasia Father   . Colon cancer Neg Hx    Social History   Socioeconomic History  . Marital status: Single    Spouse name: Not on file  . Number of children: 1  . Years of education: Not on file  . Highest education level: Not on file  Occupational History  . Occupation: retired  Scientific laboratory technician  . Financial resource strain: Not on file  . Food  insecurity    Worry: Not on file    Inability: Not on file  . Transportation needs    Medical: Not on file    Non-medical: Not on file  Tobacco Use  . Smoking status: Former Smoker    Quit date: 06/18/2011    Years since quitting: 7.3  . Smokeless tobacco: Never Used  . Tobacco comment: marijuana,  daily for years   Substance and Sexual Activity  . Alcohol use: Yes    Comment: wine on occasion  . Drug use: Yes    Frequency: 14.0 times per week    Types: Marijuana    Comment: - daily use  . Sexual activity: Not Currently    Partners: Male  Lifestyle  . Physical activity    Days per week: Not on file    Minutes per session: Not on file  . Stress: Not on file  Relationships  . Social Herbalist on phone: Not on file    Gets together: Not on file    Attends religious service: Not on file    Active member of club or organization: Not on file    Attends meetings of clubs or organizations: Not on file    Relationship status: Not on file  Other Topics Concern  . Not on file  Social History Narrative   Lives alone in a one story home.  Retired from different jobs.  Education: college.  Has one child.     Outpatient Encounter Medications as of 10/30/2018  Medication Sig  . ALPRAZolam (XANAX) 1 MG tablet TAKE 1/2 TABLET BY MOUTH THREE TIMES DAILY AS NEEDED--- dr Casimiro Needle to take over (Patient taking differently: Take 1 mg by mouth 3 (three) times daily as needed for anxiety. dr Casimiro Needle to take over)  . B Complex Vitamins (B COMPLEX PO) Take 1 tablet by mouth daily.  . benzonatate (TESSALON) 200 MG capsule Take 1 capsule (200 mg total) by mouth 2 (two) times daily as needed for cough.  . Biotin 1000 MCG tablet Take 5,000 mcg by mouth daily.   . Calcium Citrate (CITRACAL PO) Take 1 tablet by mouth daily.  . cholecalciferol (VITAMIN D) 1000 UNITS tablet Take 1,000 Units by mouth daily.   . fluticasone (FLONASE) 50 MCG/ACT nasal spray Place 2 sprays into both nostrils daily.   Marland Kitchen gabapentin (NEURONTIN) 600 MG tablet TAKE 1 TABLET(600 MG) BY MOUTH THREE TIMES DAILY  . losartan-hydrochlorothiazide (HYZAAR) 100-25 MG tablet Take 1 tablet by mouth daily. NEEDS OV  . Magnesium Citrate 100 MG TABS Take 1 tablet by mouth 2 (two) times daily.   . potassium chloride SA (K-DUR) 20 MEQ tablet TAKE  1 TABLET(20 MEQ) BY MOUTH EVERY DAY.  NEEDS OV  . tiZANidine (ZANAFLEX) 4 MG tablet TAKE 1 TO 2 TABLETS(4 TO 8 MG) BY MOUTH EVERY 6 HOURS AS NEEDED  . Valerian 450 MG CAPS Take 1 capsule by mouth every evening.   . venlafaxine XR (EFFEXOR-XR) 75 MG 24 hr capsule Take 225 mg by mouth daily.   . VENTOLIN HFA 108 (90 Base) MCG/ACT inhaler INHALE 2 PUFFS INTO THE LUNGS EVERY 6 HOURS AS NEEDED FOR WHEEZING OR SHORTNESS OF BREATH  . vitamin C (ASCORBIC ACID) 500 MG tablet Take 500 mg by mouth daily.  . vitamin E 400 UNIT capsule Take 400 Units by mouth daily.  Marland Kitchen levocetirizine (XYZAL) 5 MG tablet Take 1 tablet (5 mg total) by mouth every evening. (Patient not taking: Reported on 10/30/2018)  . [DISCONTINUED] azithromycin (ZITHROMAX Z-PAK) 250 MG tablet As directed  . [DISCONTINUED] predniSONE (DELTASONE) 10 MG tablet TAKE 3 TABLETS PO QD FOR 3 DAYS THEN TAKE 2 TABLETS PO QD FOR 3 DAYS THEN TAKE 1 TABLET PO QD FOR 3 DAYS THEN TAKE 1/2 TAB PO QD FOR 3 DAYS  . [DISCONTINUED] temazepam (RESTORIL) 15 MG capsule Take 30 mg by mouth at bedtime.    Facility-Administered Encounter Medications as of 10/30/2018  Medication  . 0.9 %  sodium chloride infusion    Activities of Daily Living In your present state of health, do you have any difficulty performing the following activities: 10/30/2018  Hearing? N  Vision? N  Difficulty concentrating or making decisions? N  Walking or climbing stairs? N  Dressing or bathing? N  Doing errands, shopping? N  Preparing Food and eating ? N  Using the Toilet? N  In the past six months, have you accidently leaked urine? N  Do you have problems with loss of bowel  control? N  Managing your Medications? N  Managing your Finances? N  Housekeeping or managing your Housekeeping? N  Some recent data might be hidden    Patient Care Team: Carollee Herter, Alferd Apa, DO as PCP - General Norma Fredrickson, MD as Consulting Physician (Psychiatry) Norma Fredrickson, MD as Consulting Physician (Psychiatry) Ralene Bathe, MD as Consulting Physician (Ophthalmology) Milus Banister, MD as Consulting Physician (Gastroenterology)    Assessment:   This is a routine wellness examination for Toni [name]. Physical assessment deferred to PCP.  Exercise Activities and Dietary recommendations Current Exercise Habits: The patient does not participate in regular exercise at present, Exercise limited by: None identified Diet (meal preparation, eat out, water intake, caffeinated beverages, dairy products, fruits and vegetables): well balanced   Goals    . Continue with counseling    . Keep specialist appointments       Fall Risk Fall Risk  10/30/2018 07/21/2017 11/26/2016 04/01/2016 08/08/2014  Falls in the past year? 0 No Yes Yes No  Number falls in past yr: - - 1 1 -  Injury with Fall? - - Yes No -  Risk for fall due to : - - - History of fall(s) -  Risk for fall due to: Comment - - - - -    Depression Screen PHQ 2/9 Scores 10/30/2018 07/21/2017 11/26/2016 04/01/2016  PHQ - 2 Score 0 2 2 6   PHQ- 9 Score - 8 11 15      Cognitive Function Ad8 score reviewed for issues:  Issues making decisions: no  Less interest in hobbies / activities:no  Repeats questions, stories (family complaining):no  Trouble using ordinary gadgets (microwave, computer, phone):no  Forgets the month or year: no  Mismanaging finances: no  Remembering appts:no  Daily problems with thinking and/or memory:no Ad8 score is=0   MMSE - Mini Mental State Exam 07/21/2017 04/01/2016  Not completed: Refused -  Orientation to time - 5  Orientation to Place - 5  Registration - 3  Attention/  Calculation - 5  Recall - 2  Language- name 2 objects - 2  Language- repeat - 1  Language- follow 3 step command - 3  Language- read & follow direction - 1  Write a sentence - 1  Copy design - 1  Total score - 29        Immunization History  Administered Date(s) Administered  . Influenza Whole 11/09/2008, 11/29/2010  . Influenza, High Dose Seasonal PF 10/04/2016  . Influenza-Unspecified 10/29/2015, 11/12/2017  . Pneumococcal Conjugate-13 08/08/2014  . Pneumococcal Polysaccharide-23 05/11/2012    Screening Tests Health Maintenance  Topic Date Due  . TETANUS/TDAP  05/18/1965  . MAMMOGRAM  05/18/2018  . INFLUENZA VACCINE  08/29/2018  . COLONOSCOPY  10/05/2020  . DEXA SCAN  Completed  . Hepatitis C Screening  Completed  . PNA vac Low Risk Adult  Completed      Plan:   See you next year!  Continue to eat heart healthy diet (full of fruits, vegetables, whole grains, lean protein, water--limit salt, fat, and sugar intake) and increase physical activity as tolerated.  Continue doing brain stimulating activities (puzzles, reading, adult coloring books, staying active) to keep memory sharp.     I have personally reviewed and noted the following in the patient's chart:   . Medical and social history . Use of alcohol, tobacco or illicit drugs  . Current medications and supplements . Functional ability and status . Nutritional status . Physical activity . Advanced directives . List of other physicians . Hospitalizations, surgeries, and ER visits in previous 12 months . Vitals . Screenings to include cognitive, depression, and falls . Referrals and appointments  In addition, I have reviewed and discussed with patient certain preventive protocols, quality metrics, and best practice recommendations. A written personalized care plan for preventive services as well as general preventive health recommendations were provided to patient.     Shela Nevin, [place]   10/30/2018

## 2018-10-30 ENCOUNTER — Other Ambulatory Visit: Payer: Self-pay

## 2018-10-30 ENCOUNTER — Ambulatory Visit (INDEPENDENT_AMBULATORY_CARE_PROVIDER_SITE_OTHER): Payer: Medicare Other | Admitting: *Deleted

## 2018-10-30 ENCOUNTER — Encounter: Payer: Self-pay | Admitting: *Deleted

## 2018-10-30 DIAGNOSIS — Z0000 Encounter for general adult medical examination without abnormal findings: Secondary | ICD-10-CM

## 2018-10-30 NOTE — Patient Instructions (Signed)
See you next year!  Continue to eat heart healthy diet (full of fruits, vegetables, whole grains, lean protein, water--limit salt, fat, and sugar intake) and increase physical activity as tolerated.  Continue doing brain stimulating activities (puzzles, reading, adult coloring books, staying active) to keep memory sharp.    [name] , Thank you for taking time to come for your Medicare Wellness Visit. I appreciate your ongoing commitment to your health goals. Please review the following plan we discussed and let me know if I can assist you in the future.   These are the goals we discussed: Goals    . Continue with counseling    . Keep specialist appointments       This is a list of the screening recommended for you and due dates:  Health Maintenance  Topic Date Due  . Tetanus Vaccine  05/18/1965  . Mammogram  05/18/2018  . Flu Shot  08/29/2018  . Colon Cancer Screening  10/05/2020  . DEXA scan (bone density measurement)  Completed  .  Hepatitis C: One time screening is recommended by Center for Disease Control  (CDC) for  adults born from [birth year] through 1964.   Completed  . Pneumonia vaccines  Completed    Health Maintenance After Age 29 After age 62, you are at a higher risk for certain long-term diseases and infections as well as injuries from falls. Falls are a major cause of broken bones and head injuries in people who are older than age 23. Getting regular preventive care can help to keep you healthy and well. Preventive care includes getting regular testing and making lifestyle changes as recommended by your health care provider. Talk with your health care provider about:  Which screenings and tests you should have. A screening is a test that checks for a disease when you have no symptoms.  A diet and exercise plan that is right for you. What should I know about screenings and tests to prevent falls? Screening and testing are the best ways to find a health problem early.  Early diagnosis and treatment give you the best chance of managing medical conditions that are common after age 32. Certain conditions and lifestyle choices may make you more likely to have a fall. Your health care provider may recommend:  Regular vision checks. Poor vision and conditions such as cataracts can make you more likely to have a fall. If you wear glasses, make sure to get your prescription updated if your vision changes.  Medicine review. Work with your health care provider to regularly review all of the medicines you are taking, including over-the-counter medicines. Ask your health care provider about any side effects that may make you more likely to have a fall. Tell your health care provider if any medicines that you take make you feel dizzy or sleepy.  Osteoporosis screening. Osteoporosis is a condition that causes the bones to get weaker. This can make the bones weak and cause them to break more easily.  Blood pressure screening. Blood pressure changes and medicines to control blood pressure can make you feel dizzy.  Strength and balance checks. Your health care provider may recommend certain tests to check your strength and balance while standing, walking, or changing positions.  Foot health exam. Foot pain and numbness, as well as not wearing proper footwear, can make you more likely to have a fall.  Depression screening. You may be more likely to have a fall if you have a fear of falling, feel emotionally  low, or feel unable to do activities that you used to do.  Alcohol use screening. Using too much alcohol can affect your balance and may make you more likely to have a fall. What actions can I take to lower my risk of falls? General instructions  Talk with your health care provider about your risks for falling. Tell your health care provider if: ? You fall. Be sure to tell your health care provider about all falls, even ones that seem minor. ? You feel dizzy, sleepy, or  off-balance.  Take over-the-counter and prescription medicines only as told by your health care provider. These include any supplements.  Eat a healthy diet and maintain a healthy weight. A healthy diet includes low-fat dairy products, low-fat (lean) meats, and fiber from whole grains, beans, and lots of fruits and vegetables. Home safety  Remove any tripping hazards, such as rugs, cords, and clutter.  Install safety equipment such as grab bars in bathrooms and safety rails on stairs.  Keep rooms and walkways well-lit. Activity   Follow a regular exercise program to stay fit. This will help you maintain your balance. Ask your health care provider what types of exercise are appropriate for you.  If you need a cane or walker, use it as recommended by your health care provider.  Wear supportive shoes that have nonskid soles. Lifestyle  Do not drink alcohol if your health care provider tells you not to drink.  If you drink alcohol, limit how much you have: ? 0-1 drink a day for women. ? 0-2 drinks a day for men.  Be aware of how much alcohol is in your drink. In the [place] one drink equals one typical bottle of beer (12 oz), one-half glass of wine (5 oz), or one shot of hard liquor (1 oz).  Do not use any products that contain nicotine or tobacco, such as cigarettes and e-cigarettes. If you need help quitting, ask your health care provider. Summary  Having a healthy lifestyle and getting preventive care can help to protect your health and wellness after age 78.  Screening and testing are the best way to find a health problem early and help you avoid having a fall. Early diagnosis and treatment give you the best chance for managing medical conditions that are more common for people who are older than age 74.  Falls are a major cause of broken bones and head injuries in people who are older than age 34. Take precautions to prevent a fall at home.  Work with your health care provider  to learn what changes you can make to improve your health and wellness and to prevent falls. This information is not intended to replace advice given to you by your health care provider. Make sure you discuss any questions you have with your health care provider. Document Released: 11/27/2016 Document Revised: 05/07/2018 Document Reviewed: 11/27/2016 Elsevier Patient Education  2020 [name].

## 2018-11-11 DIAGNOSIS — F331 Major depressive disorder, recurrent, moderate: Secondary | ICD-10-CM | POA: Diagnosis not present

## 2018-12-02 DIAGNOSIS — F331 Major depressive disorder, recurrent, moderate: Secondary | ICD-10-CM | POA: Diagnosis not present

## 2018-12-02 DIAGNOSIS — F419 Anxiety disorder, unspecified: Secondary | ICD-10-CM | POA: Diagnosis not present

## 2018-12-14 DIAGNOSIS — F419 Anxiety disorder, unspecified: Secondary | ICD-10-CM | POA: Diagnosis not present

## 2018-12-14 DIAGNOSIS — F331 Major depressive disorder, recurrent, moderate: Secondary | ICD-10-CM | POA: Diagnosis not present

## 2018-12-23 DIAGNOSIS — F419 Anxiety disorder, unspecified: Secondary | ICD-10-CM | POA: Diagnosis not present

## 2018-12-23 DIAGNOSIS — F331 Major depressive disorder, recurrent, moderate: Secondary | ICD-10-CM | POA: Diagnosis not present

## 2019-01-08 DIAGNOSIS — F331 Major depressive disorder, recurrent, moderate: Secondary | ICD-10-CM | POA: Diagnosis not present

## 2019-01-08 DIAGNOSIS — F419 Anxiety disorder, unspecified: Secondary | ICD-10-CM | POA: Diagnosis not present

## 2019-01-28 DIAGNOSIS — F331 Major depressive disorder, recurrent, moderate: Secondary | ICD-10-CM | POA: Diagnosis not present

## 2019-02-16 DIAGNOSIS — F331 Major depressive disorder, recurrent, moderate: Secondary | ICD-10-CM | POA: Diagnosis not present

## 2019-03-02 DIAGNOSIS — F331 Major depressive disorder, recurrent, moderate: Secondary | ICD-10-CM | POA: Diagnosis not present

## 2019-03-02 DIAGNOSIS — F419 Anxiety disorder, unspecified: Secondary | ICD-10-CM | POA: Diagnosis not present

## 2019-03-12 DIAGNOSIS — F331 Major depressive disorder, recurrent, moderate: Secondary | ICD-10-CM | POA: Diagnosis not present

## 2019-03-22 DIAGNOSIS — F331 Major depressive disorder, recurrent, moderate: Secondary | ICD-10-CM | POA: Diagnosis not present

## 2019-04-01 DIAGNOSIS — F331 Major depressive disorder, recurrent, moderate: Secondary | ICD-10-CM | POA: Diagnosis not present

## 2019-04-12 DIAGNOSIS — F419 Anxiety disorder, unspecified: Secondary | ICD-10-CM | POA: Diagnosis not present

## 2019-04-12 DIAGNOSIS — F331 Major depressive disorder, recurrent, moderate: Secondary | ICD-10-CM | POA: Diagnosis not present

## 2019-04-16 DIAGNOSIS — F419 Anxiety disorder, unspecified: Secondary | ICD-10-CM | POA: Diagnosis not present

## 2019-04-16 DIAGNOSIS — F331 Major depressive disorder, recurrent, moderate: Secondary | ICD-10-CM | POA: Diagnosis not present

## 2019-04-22 DIAGNOSIS — F419 Anxiety disorder, unspecified: Secondary | ICD-10-CM | POA: Diagnosis not present

## 2019-04-22 DIAGNOSIS — F331 Major depressive disorder, recurrent, moderate: Secondary | ICD-10-CM | POA: Diagnosis not present

## 2019-04-26 DIAGNOSIS — F331 Major depressive disorder, recurrent, moderate: Secondary | ICD-10-CM | POA: Diagnosis not present

## 2019-04-26 DIAGNOSIS — F419 Anxiety disorder, unspecified: Secondary | ICD-10-CM | POA: Diagnosis not present

## 2019-05-07 DIAGNOSIS — F331 Major depressive disorder, recurrent, moderate: Secondary | ICD-10-CM | POA: Diagnosis not present

## 2019-05-07 DIAGNOSIS — F419 Anxiety disorder, unspecified: Secondary | ICD-10-CM | POA: Diagnosis not present

## 2019-05-13 ENCOUNTER — Telehealth: Payer: Self-pay

## 2019-05-13 DIAGNOSIS — F331 Major depressive disorder, recurrent, moderate: Secondary | ICD-10-CM | POA: Diagnosis not present

## 2019-05-13 DIAGNOSIS — F419 Anxiety disorder, unspecified: Secondary | ICD-10-CM | POA: Diagnosis not present

## 2019-05-13 NOTE — Telephone Encounter (Signed)
Patient called in to see if Dr. Etter Sjogren could send in a prescription for  tiZANidine (ZANAFLEX) 4 MG tablet [id number] day supply   Please send it to [facility], [address]  [place], [place] [address]  Phone:  [phone] Fax:  [phone]  DEA #:  [id number]

## 2019-05-13 NOTE — Telephone Encounter (Signed)
Pt needs appointment for this medication. She has not been seen since 03/2018.

## 2019-05-14 DIAGNOSIS — Z23 Encounter for immunization: Secondary | ICD-10-CM | POA: Diagnosis not present

## 2019-05-17 ENCOUNTER — Telehealth: Payer: Self-pay

## 2019-05-17 ENCOUNTER — Ambulatory Visit: Payer: Medicare Other | Admitting: Family Medicine

## 2019-05-17 ENCOUNTER — Other Ambulatory Visit: Payer: Self-pay

## 2019-05-17 DIAGNOSIS — F331 Major depressive disorder, recurrent, moderate: Secondary | ICD-10-CM | POA: Diagnosis not present

## 2019-05-17 NOTE — Telephone Encounter (Signed)
Please advise 

## 2019-05-17 NOTE — Telephone Encounter (Signed)
Nurse Assessment Nurse: Toribio Harbour, RN, [place] Date/Time (Eastern Time): 05/14/2019 5:06:19 PM Confirm and document reason for call. If symptomatic, describe symptoms. 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 Denies any fever, shortness of breath. Has the patient had close contact with a person known or suspected to have the novel coronavirus illness OR traveled / lives in area with major community spread (including international travel) in the last 14 days from the onset of symptoms? * If Asymptomatic, screen for exposure and travel within the last 14 days. ---No Does the patient have any new or worsening symptoms? ---Yes Will a triage be completed? ---Yes Related visit to physician within the last 2 weeks? ---No Does the PT have any chronic conditions? (i.e. diabetes, asthma, this includes High risk factors for pregnancy, etc.) ---No Is this a behavioral health or substance abuse call? ---No Guidelines Guideline Title Affirmed Question Affirmed Notes Nurse Date/Time Eilene Ghazi Time) COVID-19 - Vaccine Questions and Reactions COVID-19 vaccine, systemic reactions (e.g., Toribio Harbour, RN, [place] 05/14/2019 5:10:19 PMCall Id: [id number] Guidelines Guideline Title Affirmed Question Affirmed Notes Nurse Date/Time Eilene Ghazi Time) fatigue, fever, muscle aches), questions about Disp. Time Eilene Ghazi Time) Disposition Final User 05/14/2019 5:13:11 PM Home Care Yes Toribio Harbour, RN, [place]

## 2019-05-17 NOTE — Telephone Encounter (Signed)
Toni [name] is a very common reaction to the vaccine-- should not last more than a day or 2

## 2019-05-18 ENCOUNTER — Encounter: Payer: Self-pay | Admitting: Family Medicine

## 2019-05-18 ENCOUNTER — Ambulatory Visit (INDEPENDENT_AMBULATORY_CARE_PROVIDER_SITE_OTHER): Payer: Medicare Other | Admitting: Family Medicine

## 2019-05-18 ENCOUNTER — Other Ambulatory Visit: Payer: Self-pay

## 2019-05-18 VITALS — BP 96/56 | HR 70 | Temp 97.1°F | Resp 18 | Ht 65.0 in | Wt 128.6 lb

## 2019-05-18 DIAGNOSIS — F332 Major depressive disorder, recurrent severe without psychotic features: Secondary | ICD-10-CM | POA: Diagnosis not present

## 2019-05-18 DIAGNOSIS — F419 Anxiety disorder, unspecified: Secondary | ICD-10-CM | POA: Diagnosis not present

## 2019-05-18 DIAGNOSIS — E876 Hypokalemia: Secondary | ICD-10-CM | POA: Diagnosis not present

## 2019-05-18 DIAGNOSIS — I10 Essential (primary) hypertension: Secondary | ICD-10-CM

## 2019-05-18 DIAGNOSIS — M62838 Other muscle spasm: Secondary | ICD-10-CM

## 2019-05-18 DIAGNOSIS — E782 Mixed hyperlipidemia: Secondary | ICD-10-CM | POA: Diagnosis not present

## 2019-05-18 DIAGNOSIS — D229 Melanocytic nevi, unspecified: Secondary | ICD-10-CM | POA: Diagnosis not present

## 2019-05-18 MED ORDER — POTASSIUM CHLORIDE CRYS ER 20 MEQ PO TBCR: EXTENDED_RELEASE_TABLET | ORAL | 0 refills | Status: AC

## 2019-05-18 MED ORDER — TIZANIDINE HCL 4 MG PO TABS: ORAL_TABLET | ORAL | 0 refills | Status: AC

## 2019-05-18 MED ORDER — LOSARTAN POTASSIUM-HCTZ 50-12.5 MG PO TABS: 1.0000 | ORAL_TABLET | Freq: Every day | ORAL | 1 refills | Status: AC

## 2019-05-18 NOTE — Assessment & Plan Note (Signed)
Check labs 

## 2019-05-18 NOTE — Assessment & Plan Note (Signed)
Per psych Pt very emotional today--she is having reg app with her psych Pt in office >30 min  Reviewing chart and discussing depression with pt and her bp etc

## 2019-05-18 NOTE — Progress Notes (Signed)
Patient ID: Toni [name], female    DOB: 07-Nov-1945  Age: 74 y.o. MRN: [id number]    Subjective:  Subjective  HPI Toni [name] presents for f/u bp and labs   She is very emotional today   She stopped taking her anti depressants earlier this year and became very depressed -- she did start seeing her psych again and meds were restarted recently  Pt has been under a lot of stress   Review of Systems  Constitutional: Negative for appetite change, diaphoresis, fatigue and unexpected weight change.  Eyes: Negative for pain, redness and visual disturbance.  Respiratory: Negative for cough, chest tightness, shortness of breath and wheezing.   Cardiovascular: Negative for chest pain, palpitations and leg swelling.  Endocrine: Negative for cold intolerance, heat intolerance, polydipsia, polyphagia and polyuria.  Genitourinary: Negative for difficulty urinating, dysuria and frequency.  Neurological: Negative for dizziness, light-headedness, numbness and headaches.  Psychiatric/Behavioral: Positive for decreased concentration, dysphoric mood and sleep disturbance. Negative for self-injury and suicidal ideas. The patient is nervous/anxious.     History Past Medical History:  Diagnosis Date  . Bipolar disorder ([place])    pt denies  . COPD (chronic obstructive pulmonary disease) ([place])   . Depression   . Diverticulitis   . Diverticulitis   . Fibromyalgia   . Gallstones 11/09/2015  . GERD (gastroesophageal reflux disease)   . Headache   . Hyperlipidemia   . Hypertension   . Osteoarthritis     She has a past surgical history that includes Tonsillectomy and adenoidectomy; Total hip arthroplasty (Left); Femur fracture surgery (09/2009); Joint replacement (Right, 09/2009); Colon resection (2001); Colostomy closure (2011); Cholecystectomy (N/A, 12/04/2015); and Ankle arthroscopy with open reduction internal fixation (orif) (Right, 06/20/2016).   Her family history includes Achalasia in her father;  Alcohol abuse in an other family member; Arthritis in an other family member; Cancer in an other family member; Depression in an other family member; Stroke in her father and mother.She reports that she quit smoking about 7 years ago. She has never used smokeless tobacco. She reports current alcohol use. She reports current drug use. Frequency: 14.00 times per week. Drug: Marijuana.  Current Outpatient Medications on File Prior to Visit  Medication Sig Dispense Refill  . ALPRAZolam (XANAX) 1 MG tablet TAKE 1/2 TABLET BY MOUTH THREE TIMES DAILY AS NEEDED--- dr Casimiro Needle to take over (Patient taking differently: Take 1 mg by mouth 3 (three) times daily as needed for anxiety. dr Casimiro Needle to take over) 30 tablet 0  . B Complex Vitamins (B COMPLEX PO) Take 1 tablet by mouth daily.    . Biotin 1000 MCG tablet Take 5,000 mcg by mouth daily.     . Calcium Citrate (CITRACAL PO) Take 1 tablet by mouth daily.    . cholecalciferol (VITAMIN D) 1000 UNITS tablet Take 1,000 Units by mouth daily.     Marland Kitchen gabapentin (NEURONTIN) 600 MG tablet TAKE 1 TABLET(600 MG) BY MOUTH THREE TIMES DAILY 270 tablet 1  . Magnesium Citrate 100 MG TABS Take 1 tablet by mouth 2 (two) times daily.     . Valerian 450 MG CAPS Take 1 capsule by mouth every evening.     . venlafaxine XR (EFFEXOR-XR) 75 MG 24 hr capsule Take 225 mg by mouth daily.     . VENTOLIN HFA 108 (90 Base) MCG/ACT inhaler INHALE 2 PUFFS INTO THE LUNGS EVERY 6 HOURS AS NEEDED FOR WHEEZING OR SHORTNESS OF BREATH 18 g 1  . vitamin C (ASCORBIC ACID) 500  MG tablet Take 500 mg by mouth daily.    . vitamin E 400 UNIT capsule Take 400 Units by mouth daily.     Current Facility-Administered Medications on File Prior to Visit  Medication Dose Route Frequency Provider Last Rate Last Admin  . 0.9 %  sodium chloride infusion  500 mL Intravenous Continuous Milus Banister, MD         Objective:  Objective  Physical Exam Vitals and nursing note reviewed.  Constitutional:       Appearance: She is well-developed.  HENT:     Head: Normocephalic and atraumatic.  Eyes:     Conjunctiva/sclera: Conjunctivae normal.  Neck:     Thyroid: No thyromegaly.     Vascular: No carotid bruit or JVD.  Cardiovascular:     Rate and Rhythm: Normal rate and regular rhythm.     Heart sounds: Normal heart sounds. No murmur.  Pulmonary:     Effort: Pulmonary effort is normal. No respiratory distress.     Breath sounds: Normal breath sounds. No wheezing or rales.  Chest:     Chest wall: No tenderness.  Musculoskeletal:     Cervical back: Normal range of motion and neck supple.  Skin:    Findings: Lesion present. No rash. Rash is not nodular.       Neurological:     Mental Status: She is alert and oriented to person, place, and time.  Psychiatric:        Mood and Affect: Mood is anxious and depressed. Affect is tearful.        Cognition and Memory: Cognition normal.    BP (!) 96/56 (BP Location: Left Arm, Patient Position: Sitting, Cuff Size: Normal)   Pulse 70   Temp (!) 97.1 F (36.2 C) (Temporal)   Resp 18   Ht 5\' 5"  (1.651 m)   Wt 128 lb 9.6 oz (58.3 kg)   SpO2 98%   BMI 21.40 kg/m  Wt Readings from Last 3 Encounters:  05/18/19 128 lb 9.6 oz (58.3 kg)  04/03/18 147 lb (66.7 kg)  12/30/17 145 lb 8 oz (66 kg)     Lab Results  Component Value Date   WBC 7.2 07/21/2017   HGB 12.0 07/21/2017   HCT 33.8 (L) 07/21/2017   PLT 317.0 07/21/2017   GLUCOSE 143 (H) 10/07/2017   CHOL 239 (H) 11/26/2016   TRIG 106.0 11/26/2016   HDL 75.50 11/26/2016   LDLDIRECT 162.3 05/11/2012   LDLCALC 142 (H) 11/26/2016   ALT 12 07/21/2017   AST 15 07/21/2017   NA 128 (L) 10/07/2017   K 4.1 10/07/2017   CL 92 (L) 10/07/2017   CREATININE 0.78 10/07/2017   BUN 11 10/07/2017   CO2 26 10/07/2017   TSH 1.87 07/21/2017   INR 1.05 09/30/2009   HGBA1C 5.7 04/14/2007    DG Chest 2 View  Result Date: 10/07/2017 CLINICAL DATA:  Shortness of breath. EXAM: CHEST - 2 VIEW  COMPARISON:  Radiographs of December 17, 2012. FINDINGS: The heart size and mediastinal contours are within normal limits. Both lungs are clear. No pneumothorax or pleural effusion is noted. The visualized skeletal structures are unremarkable. IMPRESSION: No active cardiopulmonary disease. Electronically Signed   By: Marijo Conception, M.D.   On: 10/07/2017 15:49   DG Ankle Complete Left  Result Date: 10/07/2017 CLINICAL DATA:  Left ankle pain after injury 2 days ago. EXAM: LEFT ANKLE COMPLETE - 3+ VIEW COMPARISON:  Radiographs of January 07, 2008. FINDINGS: Mildly  displaced oblique fracture is seen involving the distal left fibula. Joint spaces appear intact. No soft tissue abnormality is noted. IMPRESSION: Mildly displaced distal left fibular fracture. Electronically Signed   By: Marijo Conception, M.D.   On: 10/07/2017 15:48     Assessment & Plan:  Plan  I have discontinued Danell Tarbet's levocetirizine, fluticasone, benzonatate, and losartan-hydrochlorothiazide. I have also changed her potassium chloride SA. Additionally, I am having her start on losartan-hydrochlorothiazide. Lastly, I am having her maintain her cholecalciferol, Biotin, Valerian, Magnesium Citrate, Calcium Citrate (CITRACAL PO), venlafaxine XR, ALPRAZolam, vitamin C, vitamin E, B Complex Vitamins (B COMPLEX PO), gabapentin, Ventolin HFA, and tiZANidine. We will continue to administer sodium chloride.  Meds ordered this encounter  Medications  . potassium chloride SA (KLOR-CON) 20 MEQ tablet    Sig: TAKE 1 TABLET(20 MEQ) BY MOUTH EVERY DAY.  NEEDS OV    Dispense:  30 tablet    Refill:  0  . tiZANidine (ZANAFLEX) 4 MG tablet    Sig: TAKE 1 TO 2 TABLETS(4 TO 8 MG) BY MOUTH EVERY 6 HOURS AS NEEDED    Dispense:  30 tablet    Refill:  0  . losartan-hydrochlorothiazide (HYZAAR) 50-12.5 MG tablet    Sig: Take 1 tablet by mouth daily.    Dispense:  90 tablet    Refill:  1    Problem List Items Addressed This Visit       Unprioritized   Anxiety    Per psych      Essential hypertension - Primary    Running low today She has not checked her bp at home  Will lower her bp med and recheck 2-3 weeks       Relevant Medications   potassium chloride SA (KLOR-CON) 20 MEQ tablet   losartan-hydrochlorothiazide (HYZAAR) 50-12.5 MG tablet   Other Relevant Orders   Comprehensive metabolic panel   Lipid panel   CBC with Differential/Platelet   Hyperlipidemia    Check labs       Relevant Medications   losartan-hydrochlorothiazide (HYZAAR) 50-12.5 MG tablet   Hypokalemia    Check labs today      Relevant Medications   potassium chloride SA (KLOR-CON) 20 MEQ tablet   Other Relevant Orders   Comprehensive metabolic panel   Severe episode of recurrent major depressive disorder, without psychotic features ([place])    Per psych Pt very emotional today--she is having reg app with her psych Pt in office >30 min  Reviewing chart and discussing depression with pt and her bp etc       Other Visit Diagnoses    Muscle spasm       Relevant Medications   tiZANidine (ZANAFLEX) 4 MG tablet   Suspicious nevus       Relevant Orders   Ambulatory referral to Dermatology      Follow-up: Return in about 3 weeks (around 06/08/2019), or if symptoms worsen or fail to improve, for nurse visit for bp check .  Ann Held, DO

## 2019-05-18 NOTE — Assessment & Plan Note (Signed)
Per psych 

## 2019-05-18 NOTE — Assessment & Plan Note (Signed)
Check labs today.

## 2019-05-18 NOTE — Assessment & Plan Note (Signed)
Running low today She has not checked her bp at home  Will lower her bp med and recheck 2-3 weeks

## 2019-05-18 NOTE — Telephone Encounter (Signed)
VM left with advise and advise to call back to schedule a virtual due to sxs.

## 2019-05-18 NOTE — Patient Instructions (Signed)

## 2019-05-19 LAB — CBC WITH DIFFERENTIAL/PLATELET
Basophils Absolute: 0 10*3/uL (ref 0.0–0.1)
Basophils Relative: 0.6 % (ref 0.0–3.0)
Eosinophils Absolute: 0.1 10*3/uL (ref 0.0–0.7)
Eosinophils Relative: 1.8 % (ref 0.0–5.0)
HCT: 35.6 % — ABNORMAL LOW (ref 36.0–46.0)
Hemoglobin: 12.1 g/dL (ref 12.0–15.0)
Lymphocytes Relative: 23.6 % (ref 12.0–46.0)
Lymphs Abs: 1.7 10*3/uL (ref 0.7–4.0)
MCHC: 34 g/dL (ref 30.0–36.0)
MCV: 99.3 fl (ref 78.0–100.0)
Monocytes Absolute: 0.5 10*3/uL (ref 0.1–1.0)
Monocytes Relative: 6.7 % (ref 3.0–12.0)
Neutro Abs: 4.9 10*3/uL (ref 1.4–7.7)
Neutrophils Relative %: 67.3 % (ref 43.0–77.0)
Platelets: 374 10*3/uL (ref 150.0–400.0)
RBC: 3.58 Mil/uL — ABNORMAL LOW (ref 3.87–5.11)
RDW: 16.1 % — ABNORMAL HIGH (ref 11.5–15.5)
WBC: 7.3 10*3/uL (ref 4.0–10.5)

## 2019-05-19 LAB — LIPID PANEL
Cholesterol: 207 mg/dL — ABNORMAL HIGH (ref 0–200)
HDL: 84.8 mg/dL (ref >39.00)
LDL Cholesterol: 101 mg/dL — ABNORMAL HIGH (ref 0–99)
NonHDL: 121.87
Total CHOL/HDL Ratio: 2
Triglycerides: 103 mg/dL (ref 0.0–149.0)
VLDL: 20.6 mg/dL (ref 0.0–40.0)

## 2019-05-19 LAB — COMPREHENSIVE METABOLIC PANEL
ALT: 10 U/L (ref 0–35)
AST: 16 U/L (ref 0–37)
Albumin: 4.4 g/dL (ref 3.5–5.2)
Alkaline Phosphatase: 68 U/L (ref 39–117)
BUN: 17 mg/dL (ref 6–23)
CO2: 28 mEq/L (ref 19–32)
Calcium: 10.9 mg/dL — ABNORMAL HIGH (ref 8.4–10.5)
Chloride: 89 mEq/L — ABNORMAL LOW (ref 96–112)
Creatinine, Ser: 1.16 mg/dL (ref 0.40–1.20)
GFR: 45.79 mL/min — ABNORMAL LOW (ref >60.00)
Glucose, Bld: 112 mg/dL — ABNORMAL HIGH (ref 70–99)
Potassium: 4.5 mEq/L (ref 3.5–5.1)
Sodium: 127 mEq/L — ABNORMAL LOW (ref 135–145)
Total Bilirubin: 0.5 mg/dL (ref 0.2–1.2)
Total Protein: 6.8 g/dL (ref 6.0–8.3)

## 2019-05-23 ENCOUNTER — Other Ambulatory Visit: Payer: Self-pay | Admitting: Family Medicine

## 2019-05-23 DIAGNOSIS — E871 Hypo-osmolality and hyponatremia: Secondary | ICD-10-CM

## 2019-06-15 DIAGNOSIS — F331 Major depressive disorder, recurrent, moderate: Secondary | ICD-10-CM | POA: Diagnosis not present

## 2019-06-24 DIAGNOSIS — F331 Major depressive disorder, recurrent, moderate: Secondary | ICD-10-CM | POA: Diagnosis not present

## 2019-06-29 DIAGNOSIS — Z23 Encounter for immunization: Secondary | ICD-10-CM | POA: Diagnosis not present

## 2019-07-01 DIAGNOSIS — F331 Major depressive disorder, recurrent, moderate: Secondary | ICD-10-CM | POA: Diagnosis not present

## 2019-10-01 DIAGNOSIS — F331 Major depressive disorder, recurrent, moderate: Secondary | ICD-10-CM | POA: Diagnosis not present

## 2019-12-09 ENCOUNTER — Telehealth: Payer: Self-pay

## 2019-12-09 NOTE — Telephone Encounter (Signed)
I left a message asking the patient to call and schedule her yearly Medicare questionnaire with nurse [facility] Coach).  If the patient calls back, please schedule Medicare Wellness Visit with Health Coach at next available opening, preferably in office but can be video or telephone. Last AWV 10/30/2018 VDM (DD)

## 2020-04-03 DIAGNOSIS — Z23 Encounter for immunization: Secondary | ICD-10-CM | POA: Diagnosis not present

## 2020-09-06 ENCOUNTER — Encounter: Payer: Self-pay | Admitting: Gastroenterology

## 2022-10-15 ENCOUNTER — Encounter: Payer: Self-pay | Admitting: Gastroenterology
